# Patient Record
Sex: Female | Born: 1955 | Race: Black or African American | Hispanic: No | Smoking: Former smoker
Health system: Southern US, Community
[De-identification: ages and names within clinical notes are randomized; demographics above are authoritative.]

## PROBLEM LIST (undated history)

## (undated) DIAGNOSIS — IMO0001 Reserved for inherently not codable concepts without codable children: Secondary | ICD-10-CM

## (undated) DIAGNOSIS — J439 Emphysema, unspecified: Secondary | ICD-10-CM

## (undated) DIAGNOSIS — C801 Malignant (primary) neoplasm, unspecified: Secondary | ICD-10-CM

## (undated) DIAGNOSIS — I2510 Atherosclerotic heart disease of native coronary artery without angina pectoris: Secondary | ICD-10-CM

## (undated) DIAGNOSIS — M329 Systemic lupus erythematosus, unspecified: Secondary | ICD-10-CM

## (undated) DIAGNOSIS — K219 Gastro-esophageal reflux disease without esophagitis: Secondary | ICD-10-CM

## (undated) DIAGNOSIS — Z972 Presence of dental prosthetic device (complete) (partial): Secondary | ICD-10-CM

## (undated) DIAGNOSIS — M5386 Other specified dorsopathies, lumbar region: Secondary | ICD-10-CM

## (undated) DIAGNOSIS — J329 Chronic sinusitis, unspecified: Secondary | ICD-10-CM

## (undated) DIAGNOSIS — IMO0002 Reserved for concepts with insufficient information to code with codable children: Secondary | ICD-10-CM

## (undated) HISTORY — PX: CYST EXCISION: SHX5701

## (undated) HISTORY — DX: Chronic sinusitis, unspecified: J32.9

## (undated) HISTORY — PX: HAND SURGERY: SHX662

## (undated) HISTORY — PX: LUNG REMOVAL, PARTIAL: SHX233

## (undated) HISTORY — PX: TUBAL LIGATION: SHX77

## (undated) HISTORY — DX: Emphysema, unspecified: J43.9

## (undated) HISTORY — DX: Gastro-esophageal reflux disease without esophagitis: K21.9

---
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
## 2008-01-14 ENCOUNTER — Emergency Department: Payer: Self-pay | Admitting: Emergency Medicine

## 2008-01-14 ENCOUNTER — Other Ambulatory Visit: Payer: Self-pay

## 2009-09-16 ENCOUNTER — Ambulatory Visit: Payer: Self-pay | Admitting: Orthopedic Surgery

## 2009-09-23 ENCOUNTER — Ambulatory Visit: Payer: Self-pay | Admitting: Orthopedic Surgery

## 2010-06-30 ENCOUNTER — Ambulatory Visit: Payer: Self-pay | Admitting: Internal Medicine

## 2010-07-20 ENCOUNTER — Ambulatory Visit: Payer: Self-pay | Admitting: Internal Medicine

## 2010-07-31 ENCOUNTER — Ambulatory Visit: Payer: Self-pay | Admitting: Internal Medicine

## 2010-08-15 ENCOUNTER — Inpatient Hospital Stay: Payer: Self-pay | Admitting: Family Medicine

## 2010-08-31 ENCOUNTER — Ambulatory Visit: Payer: Self-pay | Admitting: Internal Medicine

## 2010-09-01 ENCOUNTER — Ambulatory Visit: Payer: Self-pay | Admitting: Internal Medicine

## 2015-09-09 ENCOUNTER — Encounter: Payer: Self-pay | Admitting: Emergency Medicine

## 2015-09-09 ENCOUNTER — Emergency Department
Admission: EM | Admit: 2015-09-09 | Discharge: 2015-09-09 | Disposition: A | Discharge status: Home / Self Care | Payer: BLUE CROSS/BLUE SHIELD | Attending: Emergency Medicine | Admitting: Emergency Medicine

## 2015-09-09 DIAGNOSIS — B349 Viral infection, unspecified: Secondary | ICD-10-CM | POA: Insufficient documentation

## 2015-09-09 DIAGNOSIS — F17200 Nicotine dependence, unspecified, uncomplicated: Secondary | ICD-10-CM | POA: Diagnosis not present

## 2015-09-09 DIAGNOSIS — R52 Pain, unspecified: Secondary | ICD-10-CM | POA: Diagnosis present

## 2015-09-09 HISTORY — DX: Systemic lupus erythematosus, unspecified: M32.9

## 2015-09-09 HISTORY — DX: Reserved for concepts with insufficient information to code with codable children: IMO0002

## 2015-09-09 LAB — CBC
HCT: 38 % (ref 35.0–47.0)
HEMOGLOBIN: 12.5 g/dL (ref 12.0–16.0)
MCH: 29.7 pg (ref 26.0–34.0)
MCHC: 32.9 g/dL (ref 32.0–36.0)
MCV: 90.3 fL (ref 80.0–100.0)
PLATELETS: 289 10*3/uL (ref 150–440)
RBC: 4.21 MIL/uL (ref 3.80–5.20)
RDW: 12.6 % (ref 11.5–14.5)
WBC: 4.5 10*3/uL (ref 3.6–11.0)

## 2015-09-09 LAB — BASIC METABOLIC PANEL
ANION GAP: 7 (ref 5–15)
BUN: 8 mg/dL (ref 6–20)
CALCIUM: 8.8 mg/dL — AB (ref 8.9–10.3)
CO2: 26 mmol/L (ref 22–32)
CREATININE: 0.81 mg/dL (ref 0.44–1.00)
Chloride: 106 mmol/L (ref 101–111)
Glucose, Bld: 82 mg/dL (ref 65–99)
Potassium: 3.7 mmol/L (ref 3.5–5.1)
Sodium: 139 mmol/L (ref 135–145)

## 2015-09-09 LAB — RAPID INFLUENZA A&B ANTIGENS (ARMC ONLY): INFLUENZA A (ARMC): NOT DETECTED

## 2015-09-09 LAB — TROPONIN I

## 2015-09-09 LAB — RAPID INFLUENZA A&B ANTIGENS: Influenza B (ARMC): NOT DETECTED

## 2015-09-09 MED ORDER — HYDROCOD POLST-CPM POLST ER 10-8 MG/5ML PO SUER: 5.0000 mL | Freq: Two times a day (BID) | ORAL | Status: DC

## 2015-09-09 MED ORDER — MORPHINE SULFATE (PF) 4 MG/ML IV SOLN
4.0000 mg | Freq: Once | INTRAVENOUS | Status: AC
Start: 2015-09-09 — End: 2015-09-09
  Administered 2015-09-09: 4 mg via INTRAVENOUS
  Filled 2015-09-09: qty 1

## 2015-09-09 MED ORDER — DIPHENHYDRAMINE HCL 50 MG/ML IJ SOLN
25.0000 mg | Freq: Once | INTRAMUSCULAR | Status: AC
  Administered 2015-09-09: 25 mg via INTRAVENOUS

## 2015-09-09 MED ORDER — ONDANSETRON HCL 4 MG/2ML IJ SOLN
4.0000 mg | Freq: Once | INTRAMUSCULAR | Status: AC
  Administered 2015-09-09: 4 mg via INTRAVENOUS
  Filled 2015-09-09: qty 2

## 2015-09-09 MED ORDER — SODIUM CHLORIDE 0.9 % IV SOLN
Freq: Once | INTRAVENOUS | Status: AC
  Administered 2015-09-09: 1000 mL via INTRAVENOUS

## 2015-09-09 MED ORDER — DIPHENHYDRAMINE HCL 50 MG/ML IJ SOLN
INTRAMUSCULAR | Status: AC
  Filled 2015-09-09: qty 1

## 2015-09-09 NOTE — ED Notes (Signed)
Pt reports "my balance ain't quite right; i'm just hurting".

## 2015-09-09 NOTE — ED Notes (Signed)
Pt placed on 2liters o2 - oxygen had dropped after being given medications.

## 2015-09-09 NOTE — ED Provider Notes (Signed)
[facility] Emergency Department Provider Note     Time seen: ----------------------------------------- 5:20 PM on 09/09/2015 -----------------------------------------    I have reviewed the triage vital signs and the nursing notes.   HISTORY  Chief Complaint Dizziness and Generalized Body Aches    HPI Sheila Suarez is a 60 y.o. female who presents ER stating her balance isn't quite right and she's hurting all over. Patient denies fever but has had chills, denies shortness of breath and has had some cough, denies persistent chest pain but does have occasional chest pain, has some sore throat but no congestion, has nausea but no vomiting and diarrhea.   Past Medical History  Diagnosis Date  . Lupus ([place])     There are no active problems to display for this patient.   History reviewed. No pertinent past surgical history.  Allergies Review of patient's allergies indicates no known allergies.  Social History Social History  Substance Use Topics  . Smoking status: Current Every Day Smoker  . Smokeless tobacco: None  . Alcohol Use: No    Review of Systems Constitutional: Negative for fever. Positive for chills and body aches Eyes: Negative for visual changes. ENT: Positive for sore throat Cardiovascular: Positive for chest pain Respiratory: Negative for shortness of breath. Positive for cough Gastrointestinal: Negative for abdominal pain, positive for nausea Genitourinary: Negative for dysuria. Musculoskeletal: Negative for back pain. Skin: Negative for rash. Neurological: Negative for headaches, focal weakness or numbness.  10-point ROS otherwise negative.  ____________________________________________   PHYSICAL EXAM:  VITAL SIGNS: ED Triage Vitals  Enc Vitals Group     BP --      Pulse --      Resp --      Temp --      Temp src --      SpO2 --      Weight --      Height --      Head Cir --      Peak Flow --      Pain  Score 09/09/15 1700 10     Pain Loc --      Pain Edu? --      Excl. in [place]? --     Constitutional: Alert and oriented.  Eyes: Conjunctivae are normal. PERRL. Normal extraocular movements. ENT   Head: Normocephalic and atraumatic.   Nose: No congestion/rhinnorhea.   Mouth/Throat: Mucous membranes are moist.   Neck: No stridor. Cardiovascular: Normal rate, regular rhythm. Normal and symmetric distal pulses are present in all extremities. No murmurs, rubs, or gallops. Respiratory: Normal respiratory effort without tachypnea nor retractions. Breath sounds are clear and equal bilaterally. No wheezes/rales/rhonchi. Gastrointestinal: Soft and nontender. No distention. No abdominal bruits.  Musculoskeletal: Nontender with normal range of motion in all extremities. No joint effusions.  No lower extremity tenderness nor edema. Neurologic:  Normal speech and language. No gross focal neurologic deficits are appreciated. Speech is normal. No gait instability. Skin:  Skin is warm, dry and intact. No rash noted. Psychiatric: Depressed mood and affect. ____________________________________________  EKG: Interpreted by me. Normal sinus rhythm with a rate of 76 bpm, normal PR interval, normal QRS, normal QT interval. Normal axis, normal EKG  ____________________________________________  ED COURSE:  Pertinent labs & imaging results that were available during my care of the patient were reviewed by me and considered in my medical decision making (see chart for details). Patient with histrionic behavior, is uncertain if this is a significant illness or not. ____________________________________________  LABS (pertinent positives/negatives)  Labs Reviewed  BASIC METABOLIC PANEL - Abnormal; Notable for the following:    Calcium 8.8 (*)    All other components within normal limits  RAPID INFLUENZA A&B ANTIGENS ([facility] ONLY)  CBC  TROPONIN I  URINALYSIS COMPLETEWITH MICROSCOPIC ([facility] ONLY)    ____________________________________________  FINAL ASSESSMENT AND PLAN  Viral syndrome  Plan: Patient with labs and as dictated above. Patient is currently improved, likely viral etiology for her symptoms. She'll be discharged with Tussionex and I will refer her to [facility] to establish care.   Earleen Newport, MD   Earleen Newport, MD 09/09/15 2008

## 2015-09-09 NOTE — Discharge Instructions (Signed)
Viral Infections A virus is a type of germ. Viruses can cause:  Minor sore throats.  Aches and pains.  Headaches.  Runny nose.  Rashes.  Watery eyes.  Tiredness.  Coughs.  Loss of appetite.  Feeling sick to your stomach (nausea).  Throwing up (vomiting).  Watery poop (diarrhea). HOME CARE   Only take medicines as told by your doctor.  Drink enough water and fluids to keep your pee (urine) clear or pale yellow. Sports drinks are a good choice.  Get plenty of rest and eat healthy. Soups and broths with crackers or rice are fine. GET HELP RIGHT AWAY IF:   You have a very bad headache.  You have shortness of breath.  You have chest pain or neck pain.  You have an unusual rash.  You cannot stop throwing up.  You have watery poop that does not stop.  You cannot keep fluids down.  You or your child has a temperature by mouth above 102 F (38.9 C), not controlled by medicine.  Your baby is older than 3 months with a rectal temperature of 102 F (38.9 C) or higher.  Your baby is 30 months old or younger with a rectal temperature of 100.4 F (38 C) or higher. MAKE SURE YOU:   Understand these instructions.  Will watch this condition.  Will get help right away if you are not doing well or get worse.   This information is not intended to replace advice given to you by your health care provider. Make sure you discuss any questions you have with your health care provider.   Document Released: 06/29/2008 Document Revised: 10/09/2011 Document Reviewed: 12/23/2014 Elsevier Interactive Patient Education [employer].

## 2016-02-07 ENCOUNTER — Encounter: Payer: Self-pay | Admitting: Emergency Medicine

## 2016-02-07 ENCOUNTER — Emergency Department: Payer: BLUE CROSS/BLUE SHIELD

## 2016-02-07 ENCOUNTER — Emergency Department
Admission: EM | Admit: 2016-02-07 | Discharge: 2016-02-07 | Disposition: A | Discharge status: Home / Self Care | Payer: BLUE CROSS/BLUE SHIELD | Attending: Emergency Medicine | Admitting: Emergency Medicine

## 2016-02-07 DIAGNOSIS — F17200 Nicotine dependence, unspecified, uncomplicated: Secondary | ICD-10-CM | POA: Insufficient documentation

## 2016-02-07 DIAGNOSIS — I2510 Atherosclerotic heart disease of native coronary artery without angina pectoris: Secondary | ICD-10-CM | POA: Insufficient documentation

## 2016-02-07 DIAGNOSIS — J449 Chronic obstructive pulmonary disease, unspecified: Secondary | ICD-10-CM | POA: Diagnosis not present

## 2016-02-07 DIAGNOSIS — K5790 Diverticulosis of intestine, part unspecified, without perforation or abscess without bleeding: Secondary | ICD-10-CM | POA: Insufficient documentation

## 2016-02-07 DIAGNOSIS — R918 Other nonspecific abnormal finding of lung field: Secondary | ICD-10-CM | POA: Diagnosis not present

## 2016-02-07 DIAGNOSIS — M545 Low back pain, unspecified: Secondary | ICD-10-CM

## 2016-02-07 DIAGNOSIS — K5730 Diverticulosis of large intestine without perforation or abscess without bleeding: Secondary | ICD-10-CM | POA: Insufficient documentation

## 2016-02-07 DIAGNOSIS — M79602 Pain in left arm: Secondary | ICD-10-CM | POA: Insufficient documentation

## 2016-02-07 DIAGNOSIS — R911 Solitary pulmonary nodule: Secondary | ICD-10-CM | POA: Insufficient documentation

## 2016-02-07 HISTORY — DX: Atherosclerotic heart disease of native coronary artery without angina pectoris: I25.10

## 2016-02-07 LAB — CBC
HEMATOCRIT: 40.3 % (ref 35.0–47.0)
Hemoglobin: 13.4 g/dL (ref 12.0–16.0)
MCH: 30.2 pg (ref 26.0–34.0)
MCHC: 33.2 g/dL (ref 32.0–36.0)
MCV: 90.9 fL (ref 80.0–100.0)
Platelets: 346 10*3/uL (ref 150–440)
RBC: 4.44 MIL/uL (ref 3.80–5.20)
RDW: 13.1 % (ref 11.5–14.5)
WBC: 8.4 10*3/uL (ref 3.6–11.0)

## 2016-02-07 LAB — TROPONIN I: Troponin I: <0.03 ng/mL (ref <0.03)

## 2016-02-07 LAB — BASIC METABOLIC PANEL
Anion gap: 5 (ref 5–15)
BUN: 15 mg/dL (ref 6–20)
CALCIUM: 9.8 mg/dL (ref 8.9–10.3)
CO2: 28 mmol/L (ref 22–32)
Chloride: 108 mmol/L (ref 101–111)
Creatinine, Ser: 0.79 mg/dL (ref 0.44–1.00)
GFR calc Af Amer: >60 mL/min (ref >60)
GLUCOSE: 104 mg/dL — AB (ref 65–99)
POTASSIUM: 4.4 mmol/L (ref 3.5–5.1)
Sodium: 141 mmol/L (ref 135–145)

## 2016-02-07 MED ORDER — MORPHINE SULFATE (PF) 4 MG/ML IV SOLN
4.0000 mg | Freq: Once | INTRAVENOUS | Status: AC
  Administered 2016-02-07: 4 mg via INTRAVENOUS

## 2016-02-07 MED ORDER — IOPAMIDOL (ISOVUE-370) INJECTION 76%
100.0000 mL | Freq: Once | INTRAVENOUS | Status: AC | PRN
  Administered 2016-02-07: 100 mL via INTRAVENOUS
  Filled 2016-02-07: qty 100

## 2016-02-07 MED ORDER — SODIUM CHLORIDE 0.9 % IV BOLUS (SEPSIS)
1000.0000 mL | Freq: Once | INTRAVENOUS | Status: AC
  Administered 2016-02-07: 1000 mL via INTRAVENOUS

## 2016-02-07 MED ORDER — MORPHINE SULFATE (PF) 4 MG/ML IV SOLN
INTRAVENOUS | Status: AC
  Administered 2016-02-07: 4 mg via INTRAVENOUS
  Filled 2016-02-07: qty 1

## 2016-02-07 MED ORDER — TRAMADOL HCL 50 MG PO TABS: 50.0000 mg | ORAL_TABLET | Freq: Four times a day (QID) | ORAL | Status: DC | PRN

## 2016-02-07 MED ORDER — ONDANSETRON HCL 4 MG/2ML IJ SOLN
4.0000 mg | Freq: Once | INTRAMUSCULAR | Status: AC
  Administered 2016-02-07: 4 mg via INTRAVENOUS

## 2016-02-07 MED ORDER — ONDANSETRON HCL 4 MG/2ML IJ SOLN
INTRAMUSCULAR | Status: AC
  Administered 2016-02-07: 4 mg via INTRAVENOUS
  Filled 2016-02-07: qty 2

## 2016-02-07 NOTE — ED Notes (Signed)
Pt presents with left arm pain, lower back pain, nausea, and sob since last night. Pt states she is pt of Dr. Nehemiah Massed, has heart blockage and is supposed to be following up with him q 3 months, but it has been over a year since she has seen him. Pt c/o just not feeling well. Pt alert & oriented with NAD noted.

## 2016-02-07 NOTE — ED Provider Notes (Signed)
[facility] Emergency Department Provider Note  Time seen: 1:47 PM  I have reviewed the triage vital signs and the nursing notes.   HISTORY  Chief Complaint Arm Pain; Nausea; Headache; and Back Pain    HPI [name] is a 60 y.o. female with a past medical history of lupus, CAD, who presents the emergency department with lower back pain and left arm pain. According to the patient her lower back began hurting last night. States this morning her left arm has been aching as well. Denies any chest pain. Does state mild nausea, with intermittent shortness breath over the past several weeks. Denies any history of back pain. Denies any strenuous activity. Denies abdominal pain. Patient states she does follow-up with Dr. Nehemiah Massed, had a cardiac catheterization performed several years ago but no stents placed.Currently describes the back pain as severe, aching pain in the lower back and dull moderate pain in the left arm.     Past Medical History  Diagnosis Date  . Lupus ([place])   . Coronary artery disease     There are no active problems to display for this patient.   History reviewed. No pertinent past surgical history.  Current Outpatient Rx  Name  Route  Sig  Dispense  Refill  . chlorpheniramine-HYDROcodone (TUSSIONEX PENNKINETIC ER) 10-8 MG/5ML SUER   Oral   Take 5 mLs by mouth 2 (two) times daily.   140 mL   0     Allergies Review of patient's allergies indicates no known allergies.  History reviewed. No pertinent family history.  Social History Social History  Substance Use Topics  . Smoking status: Current Every Day Smoker -- 0.50 packs/day  . Smokeless tobacco: None  . Alcohol Use: No     Comment: occasional    Review of Systems Constitutional: Negative for fever. Cardiovascular: Negative for chest pain. Respiratory: Negative for shortness of breath. Gastrointestinal: Negative for abdominal pain Genitourinary: Negative for  dysuria.  Musculoskeletal: Positive for back pain. Positive for left arm pain. Neurological: Negative for headache 10-point ROS otherwise negative.  ____________________________________________   PHYSICAL EXAM:  VITAL SIGNS: ED Triage Vitals  Enc Vitals Group     BP 02/07/16 1141 147/90 mmHg     Pulse Rate 02/07/16 1141 73     Resp 02/07/16 1141 18     Temp 02/07/16 1141 98.1 F (36.7 C)     Temp Source 02/07/16 1141 Oral     SpO2 02/07/16 1141 100 %     Weight 02/07/16 1141 108 lb (48.988 kg)     Height 02/07/16 1141 '5\' 5"'$  (1.651 m)     Head Cir --      Peak Flow --      Pain Score 02/07/16 1141 8     Pain Loc --      Pain Edu? --      Excl. in [place]? --     Constitutional: Alert and oriented. Well appearing and in no distress. Eyes: Normal exam ENT   Head: Normocephalic and atraumatic.   Mouth/Throat: Mucous membranes are moist. Cardiovascular: Normal rate, regular rhythm. No murmur Respiratory: Normal respiratory effort without tachypnea nor retractions. Breath sounds are clear  Gastrointestinal: Soft and nontender. No distention. Musculoskeletal: Moderate lumbar paraspinal tenderness to palpation. No left arm tenderness to palpation. Equal pulses in bilateral upper extremities. Sensation intact and equal in all extremities. Neurologic:  Normal speech and language. No gross focal neurologic deficits. Overall normal neurologic exam. Skin:  Skin is warm,  dry and intact.  Psychiatric: Mood and affect are normal.  ____________________________________________    EKG  EKG reviewed and interpreted by myself shows normal sinus rhythm at 71 bpm, narrow QRS, normal axis, normal intervals, nonspecific but no concerning ST changes. Overall reassuring EKG.  ____________________________________________    RADIOLOGY  CT shows left lung mass.  ____________________________________________    INITIAL IMPRESSION / ASSESSMENT AND PLAN / ED COURSE  Pertinent labs &  imaging results that were available during my care of the patient were reviewed by me and considered in my medical decision making (see chart for details).  The patient presents the emergency department lower back pain and left arm pain which began last night and worsened today. Patient doesn't moderate tenderness palpation of the lower back, no tenderness of the left arm. Equal pulses in bilateral upper extremities. We will check labs, proceed with a CT angiography to rule out dissection.  Labs are largely within normal limits including negative troponin. CTA pending.  CTA shows no aortic abnormality. There is a spiculated left upper lobe mass increased from prior exam. I discussed this with the patient and need to follow up with oncology. I have given the patient follow-up information. Patient states she is feeling much better after pain medication. Her second troponin is pending. If her second troponin is negative we will have the patient follow-up with oncology as well as cardiology this week. Patient agreeable to plan.  Repeat troponin negative. We'll discharge from a short course of pain medication.  ____________________________________________   FINAL CLINICAL IMPRESSION(S) / ED DIAGNOSES  Back pain Left lung mass  Harvest Dark, MD 02/07/16 1538

## 2016-02-07 NOTE — Discharge Instructions (Signed)
Your CT scan today shows a left lung mass. Please follow-up with oncology by calling the number provided to arrange further evaluation. Please take your pain medication as needed, as prescribed. Follow-up with your primary care doctor as well as Dr. Nehemiah Massed this week. Return to the emergency department for any worsening pain, or any other symptom personally concerning to your self.   Back Pain, Adult Back pain is very common in adults.The cause of back pain is rarely dangerous and the pain often gets better over time.The cause of your back pain may not be known. Some common causes of back pain include:  Strain of the muscles or ligaments supporting the spine.  Wear and tear (degeneration) of the spinal disks.  Arthritis.  Direct injury to the back. For many people, back pain may return. Since back pain is rarely dangerous, most people can learn to manage this condition on their own. HOME CARE INSTRUCTIONS Watch your back pain for any changes. The following actions may help to lessen any discomfort you are feeling:  Remain active. It is stressful on your back to sit or stand in one place for long periods of time. Do not sit, drive, or stand in one place for more than 30 minutes at a time. Take short walks on even surfaces as soon as you are able.Try to increase the length of time you walk each day.  Exercise regularly as directed by your health care provider. Exercise helps your back heal faster. It also helps avoid future injury by keeping your muscles strong and flexible.  Do not stay in bed.Resting more than 1-2 days can delay your recovery.  Pay attention to your body when you bend and lift. The most comfortable positions are those that put less stress on your recovering back. Always use proper lifting techniques, including:  Bending your knees.  Keeping the load close to your body.  Avoiding twisting.  Find a comfortable position to sleep. Use a firm mattress and lie on your  side with your knees slightly bent. If you lie on your back, put a pillow under your knees.  Avoid feeling anxious or stressed.Stress increases muscle tension and can worsen back pain.It is important to recognize when you are anxious or stressed and learn ways to manage it, such as with exercise.  Take medicines only as directed by your health care provider. Over-the-counter medicines to reduce pain and inflammation are often the most helpful.Your health care provider may prescribe muscle relaxant drugs.These medicines help dull your pain so you can more quickly return to your normal activities and healthy exercise.  Apply ice to the injured area:  Put ice in a plastic bag.  Place a towel between your skin and the bag.  Leave the ice on for 20 minutes, 2-3 times a day for the first 2-3 days. After that, ice and heat may be alternated to reduce pain and spasms.  Maintain a healthy weight. Excess weight puts extra stress on your back and makes it difficult to maintain good posture. SEEK MEDICAL CARE IF:  You have pain that is not relieved with rest or medicine.  You have increasing pain going down into the legs or buttocks.  You have pain that does not improve in one week.  You have night pain.  You lose weight.  You have a fever or chills. SEEK IMMEDIATE MEDICAL CARE IF:   You develop new bowel or bladder control problems.  You have unusual weakness or numbness in your arms or  legs.  You develop nausea or vomiting.  You develop abdominal pain.  You feel faint.   This information is not intended to replace advice given to you by your health care provider. Make sure you discuss any questions you have with your health care provider.   Document Released: 07/17/2005 Document Revised: 08/07/2014 Document Reviewed: 11/18/2013 Elsevier Interactive Patient Education [employer].

## 2016-02-07 NOTE — ED Notes (Signed)
Pt states back pain and left arm pain the began this AM, states nausea and a "bloody" feeling in her mouth, pt awake and alert in no acute distress

## 2016-02-07 NOTE — ED Notes (Signed)
No signature strip available in this room

## 2016-02-09 ENCOUNTER — Inpatient Hospital Stay: Payer: BLUE CROSS/BLUE SHIELD | Attending: Hematology and Oncology | Admitting: Hematology and Oncology

## 2016-02-09 ENCOUNTER — Encounter: Payer: Self-pay | Admitting: Hematology and Oncology

## 2016-02-09 ENCOUNTER — Inpatient Hospital Stay: Payer: BLUE CROSS/BLUE SHIELD

## 2016-02-09 VITALS — BP 145/86 | HR 50 | Temp 97.2°F | Resp 18 | Ht 66.14 in | Wt 112.7 lb

## 2016-02-09 DIAGNOSIS — R079 Chest pain, unspecified: Secondary | ICD-10-CM

## 2016-02-09 DIAGNOSIS — M329 Systemic lupus erythematosus, unspecified: Secondary | ICD-10-CM | POA: Insufficient documentation

## 2016-02-09 DIAGNOSIS — R918 Other nonspecific abnormal finding of lung field: Secondary | ICD-10-CM | POA: Insufficient documentation

## 2016-02-09 DIAGNOSIS — Z801 Family history of malignant neoplasm of trachea, bronchus and lung: Secondary | ICD-10-CM

## 2016-02-09 DIAGNOSIS — F17210 Nicotine dependence, cigarettes, uncomplicated: Secondary | ICD-10-CM | POA: Diagnosis not present

## 2016-02-09 DIAGNOSIS — R911 Solitary pulmonary nodule: Secondary | ICD-10-CM | POA: Diagnosis not present

## 2016-02-09 DIAGNOSIS — R0602 Shortness of breath: Secondary | ICD-10-CM | POA: Diagnosis not present

## 2016-02-09 DIAGNOSIS — Z800 Family history of malignant neoplasm of digestive organs: Secondary | ICD-10-CM | POA: Diagnosis not present

## 2016-02-09 DIAGNOSIS — R634 Abnormal weight loss: Secondary | ICD-10-CM | POA: Diagnosis not present

## 2016-02-09 DIAGNOSIS — I2510 Atherosclerotic heart disease of native coronary artery without angina pectoris: Secondary | ICD-10-CM | POA: Insufficient documentation

## 2016-02-09 DIAGNOSIS — R109 Unspecified abdominal pain: Secondary | ICD-10-CM | POA: Diagnosis not present

## 2016-02-09 DIAGNOSIS — Z79899 Other long term (current) drug therapy: Secondary | ICD-10-CM | POA: Diagnosis not present

## 2016-02-09 LAB — HEPATIC FUNCTION PANEL
ALT: 19 U/L (ref 14–54)
AST: 20 U/L (ref 15–41)
Albumin: 3.8 g/dL (ref 3.5–5.0)
Alkaline Phosphatase: 64 U/L (ref 38–126)
Bilirubin, Direct: <0.1 mg/dL — ABNORMAL LOW (ref 0.1–0.5)
Total Bilirubin: 0.6 mg/dL (ref 0.3–1.2)
Total Protein: 6.5 g/dL (ref 6.5–8.1)

## 2016-02-09 LAB — TSH: TSH: 0.635 u[IU]/mL (ref 0.350–4.500)

## 2016-02-09 LAB — T4, FREE: Free T4: 0.81 ng/dL (ref 0.61–1.12)

## 2016-02-09 NOTE — Progress Notes (Signed)
Sheila Suarez is a 60 y.o. female with lupus and a left upper lobe nodule who is referred by Dr. Harvest Dark for assessment and management.  HPI: The patient notes a history of lupus for the past 8 years. She states that she was treated with "something" briefly as it affected her vision.  She is not followed regularly by rheumatology.  She has a half pack per day smoking history for greater than 30 years.  She wuit briefly when she was pregnant. She denies any history of emphysema or COPD.  She presented to the [facility] ER on 02/07/2016 with left arm pain and shortness of breath. As part of her evaluation, she underwent CT angiogram of the chest, abdominal and pelvis. There was no evidence of aortic pathology. There was a 1.1 cm spiculated lesion in the left upper lobe increased from prior exam (08/30/2010). There were stable small pulmonary nodules bilaterally (2-3 mm).  Labs on 02/07/2016 included a normal CBC and BMP.  Creatinine was 0.79.  Calcium was 9.8.  The patient notes a weight loss of approximate 16 pounds (unclear period of time).   She states that she eats constantly and should not be losing weight.  She notes a shortness of breath and a little chest pain with exertion. She notes stomach cramping. She has never seen GI.    She has a family history of sister of pancreatic and lung cancer. A brother has "cancer". Paternal uncle, aunts, and cousins have brain, lung, and prostate cancer. Paternal grandmother developed a gastric cancer in her 68s.   Past Medical History  Diagnosis Date  . Lupus ([place])   . Coronary artery disease     Past Surgical History  Procedure Laterality Date  . Tubal ligation Bilateral     37 years ago    No family history on file.  Social History:  reports that she has been smoking.  She does not have any smokeless tobacco history on file. She  reports that she does not drink alcohol. Her drug history is not on file.  She works in [place] in [employer], trade [employer].  The patient is alone today.  Allergies: No Known Allergies  Current Medications: Current Outpatient Prescriptions  Medication Sig Dispense Refill  . traMADol (ULTRAM) 50 MG tablet Take 1 tablet (50 mg total) by mouth every 6 (six) hours as needed for moderate pain. 20 tablet 0   No current facility-administered medications for this visit.    Review of Systems:  GENERAL:  Feels "ok".  No fevers.  Sweats at night.  Weight loss of 16 pounds over an unclear period of time. PERFORMANCE STATUS (ECOG):  1 HEENT:  Poor vision (close and far away).  Needs glasses.  No runny nose, sore throat, mouth sores or tenderness. Lungs: Shortness of breath.  No cough.  Little phlegm.  No hemoptysis. Cardiac:  No chest pain, palpitations, orthopnea, or PND. GI:  Stomach cramping.  No nausea, vomiting, diarrhea, constipation, melena or hematochezia. GU:  No urgency, frequency, dysuria, or hematuria. Musculoskeletal:  No back pain.  No joint pain.  No muscle tenderness. Extremities:  No pain or swelling. Skin:  No rashes or skin changes. Neuro:  No headache, numbness or weakness, balance or coordination issues. Endocrine:  No diabetes, thyroid issues, hot flashes or night sweats. Psych:  No mood changes, depression or anxiety. Pain:  No focal pain. Review of systems:  All other systems reviewed and found to be negative.  Physical Exam: Blood pressure 145/86, pulse 50, temperature 97.2 F (36.2 C), temperature source Tympanic, resp. rate 18, height 5' 6.14" (1.68 m), weight 112 lb 10.5 oz (51.1 kg). GENERAL:  Thin woman sitting comfortably in the exam room in no acute distress. MENTAL STATUS:  Alert and oriented to person, place and time. HEAD:  Wearing a light green hat. Brown hair.  Normocephalic, atraumatic, face symmetric, no Cushingoid features. EYES:  Glasses.  Brown eyes.  Pupils  equal round and reactive to light and accomodation.  No conjunctivitis or scleral icterus. ENT:  Oropharynx clear without lesion.  Dentures.  Tongue normal. Mucous membranes moist.  RESPIRATORY:  Clear to auscultation without rales, wheezes or rhonchi. CARDIOVASCULAR:  Regular rate and rhythm without murmur, rub or gallop. ABDOMEN:  Soft, non-tender, with active bowel sounds, and no hepatosplenomegaly.  No masses. SKIN:  No rashes, ulcers or lesions. EXTREMITIES: Slight edema left lower extremity.  No no skin discoloration or tenderness.  No palpable cords. LYMPH NODES: No palpable cervical, supraclavicular, axillary or inguinal adenopathy  NEUROLOGICAL: Unremarkable. PSYCH:  Appropriate.  Admission on 02/07/2016, Discharged on 02/07/2016  Component Date Value Ref Range Status  . Sodium 02/07/2016 141  135 - 145 mmol/L Final  . Potassium 02/07/2016 4.4  3.5 - 5.1 mmol/L Final  . Chloride 02/07/2016 108  101 - 111 mmol/L Final  . CO2 02/07/2016 28  22 - 32 mmol/L Final  . Glucose, Bld 02/07/2016 104* 65 - 99 mg/dL Final  . BUN 02/07/2016 15  6 - 20 mg/dL Final  . Creatinine, Ser 02/07/2016 0.79  0.44 - 1.00 mg/dL Final  . Calcium 02/07/2016 9.8  8.9 - 10.3 mg/dL Final  . GFR calc non Af Amer 02/07/2016 >60  >60 mL/min Final  . GFR calc Af Amer 02/07/2016 >60  >60 mL/min Final   Comment: (NOTE) The eGFR has been calculated using the CKD EPI equation. This calculation has not been validated in all clinical situations. eGFR's persistently <60 mL/min signify possible Chronic Kidney Disease.   . Anion gap 02/07/2016 5  5 - 15 Final  . WBC 02/07/2016 8.4  3.6 - 11.0 K/uL Final  . RBC 02/07/2016 4.44  3.80 - 5.20 MIL/uL Final  . Hemoglobin 02/07/2016 13.4  12.0 - 16.0 g/dL Final  . HCT 02/07/2016 40.3  35.0 - 47.0 % Final  . MCV 02/07/2016 90.9  80.0 - 100.0 fL Final  . MCH 02/07/2016 30.2  26.0 - 34.0 pg Final  . MCHC 02/07/2016 33.2  32.0 - 36.0 g/dL Final  . RDW 02/07/2016 13.1  11.5  - 14.5 % Final  . Platelets 02/07/2016 346  150 - 440 K/uL Final  . Troponin I 02/07/2016 <0.03  <0.03 ng/mL Final  . Troponin I 02/07/2016 <0.03  <0.03 ng/mL Final    Assessment:  Sheila Suarez is a 60 y.o. female with lupus x 8 years.  CT angiogram of the chest, abdominal and pelvis on 02/17/2016 revealed a 1.1 cm spiculated lesion in the left upper lobe increased from prior exam (08/30/2010). There were stable small pulmonary nodules bilaterally (2-3 mm).  She has a 15 pack year smoking history.  Symptomatically, she has lost 16 pounds over an unclear amount of time.  She has never had a colonoscopy.  Exam reveals no adenopathy or hepatosplenomegaly.  Plan: 1.  Review recent imaging.  Discuss unclear LUL lesion.  Discuss PET scan.  Discuss baseline labs.  Discuss presentation  a tumor board. 2.  Labs today:  LFTs, TSH, free T4. 3.  Schedule PET scan. 4.  RTC after PET scan for MD review and discussion regarding direction of therapy.   Lequita Asal, MD  02/09/2016, 2:09 PM

## 2016-02-09 NOTE — Progress Notes (Signed)
Patient is here for a new evaluation, she is asking about what the plan of action is, some chest discomfort

## 2016-02-13 ENCOUNTER — Encounter: Payer: Self-pay | Admitting: Hematology and Oncology

## 2016-02-14 ENCOUNTER — Telehealth: Payer: Self-pay

## 2016-02-14 ENCOUNTER — Other Ambulatory Visit: Payer: Self-pay | Admitting: Hematology and Oncology

## 2016-02-14 ENCOUNTER — Telehealth: Payer: Self-pay | Admitting: *Deleted

## 2016-02-14 DIAGNOSIS — M329 Systemic lupus erythematosus, unspecified: Secondary | ICD-10-CM

## 2016-02-14 NOTE — Telephone Encounter (Signed)
  Can we set up a referral to Dr. Jefm Bryant?  M

## 2016-02-14 NOTE — Telephone Encounter (Signed)
Can we please send a referral to Dr. Jefm Bryant, the order is in the computer. Thanks!

## 2016-02-14 NOTE — Telephone Encounter (Signed)
Spoke with Dr. Mike Gip, she was agreeable with sending patient to see Dr. Jefm Bryant. Referral has been placed and send over to colette to finish the referral process.

## 2016-02-14 NOTE — Telephone Encounter (Signed)
Dr Mike Gip told her she needs to see her Lupus doctor and they told her she needs a referral before they will see her because she never went to her first appt with them, Dr Duard Brady

## 2016-02-15 ENCOUNTER — Encounter
Admission: RE | Admit: 2016-02-15 | Discharge: 2016-02-15 | Disposition: A | Discharge status: Home / Self Care | Payer: BLUE CROSS/BLUE SHIELD | Source: Ambulatory Visit | Attending: Hematology and Oncology | Admitting: Hematology and Oncology

## 2016-02-15 DIAGNOSIS — R911 Solitary pulmonary nodule: Secondary | ICD-10-CM | POA: Diagnosis present

## 2016-02-15 DIAGNOSIS — R634 Abnormal weight loss: Secondary | ICD-10-CM | POA: Insufficient documentation

## 2016-02-15 LAB — GLUCOSE, CAPILLARY: Glucose-Capillary: 63 mg/dL — ABNORMAL LOW (ref 65–99)

## 2016-02-15 MED ORDER — FLUDEOXYGLUCOSE F - 18 (FDG) INJECTION
12.7600 | Freq: Once | INTRAVENOUS | Status: AC | PRN
  Administered 2016-02-15: 12.76 via INTRAVENOUS

## 2016-02-16 NOTE — Telephone Encounter (Signed)
Referral entered and med. rec sent notice and she has an appt that md office called lisa yest and they had called pt to tell her the appt and it is next week

## 2016-02-18 ENCOUNTER — Inpatient Hospital Stay (HOSPITAL_BASED_OUTPATIENT_CLINIC_OR_DEPARTMENT_OTHER): Payer: BLUE CROSS/BLUE SHIELD | Admitting: Hematology and Oncology

## 2016-02-18 ENCOUNTER — Encounter: Payer: Self-pay | Admitting: Hematology and Oncology

## 2016-02-18 VITALS — BP 124/79 | HR 57 | Temp 97.7°F | Resp 18 | Wt 114.6 lb

## 2016-02-18 DIAGNOSIS — M329 Systemic lupus erythematosus, unspecified: Secondary | ICD-10-CM

## 2016-02-18 DIAGNOSIS — Z801 Family history of malignant neoplasm of trachea, bronchus and lung: Secondary | ICD-10-CM

## 2016-02-18 DIAGNOSIS — Z800 Family history of malignant neoplasm of digestive organs: Secondary | ICD-10-CM

## 2016-02-18 DIAGNOSIS — F17210 Nicotine dependence, cigarettes, uncomplicated: Secondary | ICD-10-CM

## 2016-02-18 DIAGNOSIS — R918 Other nonspecific abnormal finding of lung field: Secondary | ICD-10-CM

## 2016-02-18 DIAGNOSIS — R911 Solitary pulmonary nodule: Secondary | ICD-10-CM | POA: Diagnosis not present

## 2016-02-18 DIAGNOSIS — R634 Abnormal weight loss: Secondary | ICD-10-CM | POA: Diagnosis not present

## 2016-02-18 DIAGNOSIS — Z79899 Other long term (current) drug therapy: Secondary | ICD-10-CM

## 2016-02-18 DIAGNOSIS — I2510 Atherosclerotic heart disease of native coronary artery without angina pectoris: Secondary | ICD-10-CM

## 2016-02-18 DIAGNOSIS — R0602 Shortness of breath: Secondary | ICD-10-CM

## 2016-02-18 NOTE — Progress Notes (Signed)
Patient is here for pet scan results.

## 2016-02-18 NOTE — Progress Notes (Signed)
[facility] day:  02/18/2016  Chief Complaint: Sheila Suarez is a 60 y.o. female with lupus and a left upper lobe nodule who is seen for review of interval PET scan and discussion regarding direction of therapy.  HPI: The patient was last seen in the medical oncology clinic on 02/09/2016.  At that time, CT angiogram of the chest, abdominal and pelvis was reviewed.  Images on 02/17/2016 revealed a 1.1 cm spiculated lesion in the left upper lobe increased from prior exam (08/30/2010). There were stable small pulmonary nodules bilaterally (2-3 mm).  She had a 15 pack year smoking history.  She had lost weight (16 pounds over an unspecified period).    TSH was 0.635 (normal).  Free T4 was 0.81 (normal).  PET scan on 02/15/2016 revealed an irregular 1.0 cm apical LUL pulmonary nodule (SUV 0.7).  There was a 4 mm right middle lobe solid pulmonary nodule (stable since 08/02/2010) and two additional right lower lobe 3 mm pulmonary nodules. Follow-up chest CT in 12 months was recommended.  Symptomatically, she denies any new complaints.  Weight is up 2 pounds (she thinks from heavier shoes).   Past Medical History  Diagnosis Date  . Lupus ([place])   . Coronary artery disease     Past Surgical History  Procedure Laterality Date  . Tubal ligation Bilateral     37 years ago    Family History  Problem Relation Age of Onset  . Pancreatic cancer Sister   . Lung cancer Sister   . Lung cancer Brother   . Stomach cancer Paternal Grandmother     Social History:  reports that she has been smoking.  She does not have any smokeless tobacco history on file. She reports that she does not drink alcohol. Her drug history is not on file.  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  The patient is alone today.  Allergies: No Known Allergies  Current Medications: Current Outpatient Prescriptions  Medication Sig Dispense Refill  . traMADol (ULTRAM) 50 MG  tablet Take 1 tablet (50 mg total) by mouth every 6 (six) hours as needed for moderate pain. 20 tablet 0   No current facility-administered medications for this visit.    Review of Systems:  GENERAL:  Feels "ok".  No fevers.  Sweats at night.  Weight loss of 16 pounds over an unclear period of time. PERFORMANCE STATUS (ECOG):  1 HEENT:  Poor vision (near and far).  Needs glasses.  No runny nose, sore throat, mouth sores or tenderness. Lungs: Shortness of breath, stable.  No cough.  Little phlegm.  No hemoptysis. Cardiac:  No chest pain, palpitations, orthopnea, or PND. GI:  No nausea, vomiting, diarrhea, constipation, melena or hematochezia. GU:  No urgency, frequency, dysuria, or hematuria. Musculoskeletal:  No back pain.  No joint pain.  No muscle tenderness. Extremities:  No pain or swelling. Skin:  No rashes or skin changes. Neuro:  No headache, numbness or weakness, balance or coordination issues. Endocrine:  No diabetes, thyroid issues, hot flashes or night sweats. Psych:  No mood changes, depression or anxiety. Pain:  No focal pain. Review of systems:  All other systems reviewed and found to be negative.  Physical Exam: Blood pressure 124/79, pulse 57, temperature 97.7 F (36.5 C), temperature source Tympanic, resp. rate 18, weight 114 lb 10.2 oz (52 kg). GENERAL:  Thin woman sitting comfortably in the exam room in no acute distress. MENTAL STATUS:  Alert and  oriented to person, place and time. HEAD:  Brown hair.  Normocephalic, atraumatic, face symmetric, no Cushingoid features. EYES:  Glasses.  Brown eyes.  No conjunctivitis or scleral icterus. NEUROLOGICAL: Unremarkable. PSYCH:  Appropriate.   No visits with results within 3 Day(s) from this visit. Latest known visit with results is:  Hospital Outpatient Visit on 02/15/2016  Component Date Value Ref Range Status  . Glucose-Capillary 02/15/2016 63* 65 - 99 mg/dL Final    Assessment:  Sheila Suarez is a 60 y.o.  female with lupus x 8 years.  CT angiogram of the chest, abdominal and pelvis on 02/17/2016 revealed a 1.1 cm spiculated lesion in the left upper lobe increased from prior exam (08/30/2010). There were stable small pulmonary nodules bilaterally (2-3 mm).  She has a 15 pack year smoking history.  PET scan on 02/15/2016 revealed an irregular 1.0 cm apical LUL pulmonary nodule (SUV 0.7).  There was a 4 mm right middle lobe solid pulmonary nodule (stable since 08/02/2010) and two additional right lower lobe 3 mm pulmonary nodules.   Symptomatically, she has lost 16 pounds over an unclear amount of time.  Thyroid function studies were normal.  She has never had a colonoscopy.  Exam reveals no adenopathy or hepatosplenomegaly.  Plan: 1.  Review PET scan.  Discuss LUL pulmonary nodule, possibly representing a slow growing tumor (low SUV).  Discuss radiology recommendations of follow-up in 12 months.  Discuss referral to cardiothoracic surgery and presentation at tumor board.  If follow-up recommended, recommend 6 month interval.  Discuss checking PFTs (surgery contemplated).   2.  Discuss with Dr. Nadeen Landau. 3.  Referral to cardiothoracic surgery (Dr Genevive Bi). 4.  Schedule PFTs. 5.  Present at tumor board on 02/24/2016. 6.  RTC in 2 weeks for MD review and discussion regarding plan for LUL lesion.   Lequita Asal, MD  02/18/2016, 11:52 AM

## 2016-02-21 DIAGNOSIS — C3490 Malignant neoplasm of unspecified part of unspecified bronchus or lung: Secondary | ICD-10-CM | POA: Insufficient documentation

## 2016-02-21 DIAGNOSIS — R760 Raised antibody titer: Secondary | ICD-10-CM | POA: Insufficient documentation

## 2016-02-22 ENCOUNTER — Ambulatory Visit (INDEPENDENT_AMBULATORY_CARE_PROVIDER_SITE_OTHER): Payer: BLUE CROSS/BLUE SHIELD | Admitting: Cardiothoracic Surgery

## 2016-02-22 ENCOUNTER — Encounter: Payer: Self-pay | Admitting: Cardiothoracic Surgery

## 2016-02-22 VITALS — BP 145/88 | HR 83 | Temp 98.3°F | Ht 65.0 in | Wt 112.0 lb

## 2016-02-22 DIAGNOSIS — R918 Other nonspecific abnormal finding of lung field: Secondary | ICD-10-CM | POA: Diagnosis not present

## 2016-02-22 NOTE — Progress Notes (Signed)
Patient ID: Sheila Suarez, female   DOB: 07/12/1956, 60 y.o.   MRN: [id number]  Chief Complaint  Patient presents with  . Other    Left Upper Lobe Lesion    Referred By Dr. Nolon Stalls Reason for Referral left upper lobe mass  HPI Location, Quality, Duration, Severity, Timing, Context, Modifying Factors, Associated Signs and Symptoms.  Bryanne Riquelme is a 60 y.o. female.  She was in her usual state of health until several weeks ago when she experienced severe back pain and left arm pain. At the time she was felt to have a myocardial infarction and was brought to the emergency department. While in the emergency department a chest x-ray and CT scan were performed. The CT scan revealed a left upper lobe nodule. The nodule in the left upper lobe was compared to a prior CT scan from 2012. The nodule had increased in size. The patient states that she does get short of breath with minimal activities. Walking up and down a flight of stairs causes significant limitations. She continues to have the same back pain that brought her into the emergency department and she states that the tramadol which has been prescribed for her does not work. She states that her arm pain is now better. She is a lifelong smoker. She smokes one pack cigarettes a day. She is currently trying to reduce that and is down to just a few cigarettes per day. She has not had any hemoptysis fevers or chills. She does carry a diagnosis of lupus. She has multiple family members with malignancies including some with lung cancer.   Past Medical History:  Diagnosis Date  . Coronary artery disease   . Lupus [facility])     Past Surgical History:  Procedure Laterality Date  . CYST EXCISION Left   . TUBAL LIGATION Bilateral    37 years ago    Family History  Problem Relation Age of Onset  . Pancreatic cancer Sister   . Lung cancer Sister   . Lung cancer Brother   . Stomach cancer Paternal Grandmother   . Heart disease Father    . Cancer Daughter 86    cervical  . Cancer Maternal Aunt     brain  . Cancer Maternal Uncle     prostate  . Cancer Maternal Aunt     bone    Social History Social History  Substance Use Topics  . Smoking status: Current Every Day Smoker    Packs/day: 0.50    Years: 35.00  . Smokeless tobacco: Not on file  . Alcohol use 0.6 oz/week    1 Cans of beer per week     Comment: occasional    No Known Allergies  Current Outpatient Prescriptions  Medication Sig Dispense Refill  . traMADol (ULTRAM) 50 MG tablet Take 1 tablet (50 mg total) by mouth every 6 (six) hours as needed for moderate pain. 20 tablet 0   No current facility-administered medications for this visit.       Review of Systems A complete review of systems was asked and was negative except for the following positive findingsRecent weight loss, swelling of lower extremities, joint pain, easy bruising.  She states that she did have a cardiac catheterization about 8 years ago by Dr. Jose Persia and is scheduled to follow-up with him in a couple weeks.  Blood pressure (!) 145/88, pulse 83, temperature 98.3 F (36.8 C), temperature source Oral, height '5\' 5"'$  (1.651 m), weight 112 lb (50.8 kg), SpO2 100 %.  Physical Exam CONSTITUTIONAL:  Pleasant, well-developed, well-nourished, and in no acute distress. EYES: Pupils equal and reactive to light, Sclera non-icteric EARS, NOSE, MOUTH AND THROAT:  The oropharynx was clear.  Dentition is absent.  Oral mucosa pink and moist. LYMPH NODES:  Lymph nodes in the neck and axillae were normal RESPIRATORY:  Lungs were clear.  Normal respiratory effort without pathologic use of accessory muscles of respiration CARDIOVASCULAR: Heart was regular without murmurs.  There were no carotid bruits. GI: The abdomen was soft, nontender, and nondistended. There were no palpable masses. There was no hepatosplenomegaly. There were normal bowel sounds in all quadrants. GU:  Rectal deferred.    MUSCULOSKELETAL:  Normal muscle strength and tone.  No clubbing or cyanosis.   SKIN:  There were no pathologic skin lesions.  There were no nodules on palpation. NEUROLOGIC:  Sensation is normal.  Cranial nerves are grossly intact. PSYCH:  Oriented to person, place and time.  Mood and affect are normal.  Data Reviewed CT scans and PET scans  I have personally reviewed the patient's imaging, laboratory findings and medical records.    Assessment    I have independently reviewed the patient's chest CT and PET scan and compared it to her prior scans. The left upper lobe nodule is certainly concerning for a malignancy. It is a very slow-growing tumor as there has been only some change over the last 5 years.    Plan    I explained to the patient the indications and risks of the various diagnostic and treatment options. We reviewed the options of surgery versus radiation therapy for stage I carcinoma the lung. We also discussed the pros and cons of the various treatment plans. He also discussed the possibility of performing a bronchoscopy or percutaneous biopsy. The patient had all of her questions answered. She will obtain her pulmonary function tests next week and I will follow-up with her after that.       [name], MD 02/22/2016, 10:24 AM

## 2016-02-22 NOTE — Patient Instructions (Addendum)
Your Pulmonary Function Test is scheduled for 02/29/2016 at the [facility] at 9:30 AM but please arrive at 9:15 AM.  After you have your Pulmonary Function Test, I will need to see you back 03/03/2016 so we could talk about your options.

## 2016-02-23 ENCOUNTER — Emergency Department
Admission: EM | Admit: 2016-02-23 | Discharge: 2016-02-23 | Disposition: A | Discharge status: Home / Self Care | Payer: BLUE CROSS/BLUE SHIELD | Attending: Emergency Medicine | Admitting: Emergency Medicine

## 2016-02-23 ENCOUNTER — Emergency Department: Payer: BLUE CROSS/BLUE SHIELD

## 2016-02-23 ENCOUNTER — Encounter: Payer: Self-pay | Admitting: Emergency Medicine

## 2016-02-23 DIAGNOSIS — I2510 Atherosclerotic heart disease of native coronary artery without angina pectoris: Secondary | ICD-10-CM | POA: Diagnosis not present

## 2016-02-23 DIAGNOSIS — Y999 Unspecified external cause status: Secondary | ICD-10-CM | POA: Insufficient documentation

## 2016-02-23 DIAGNOSIS — Y92007 Garden or yard of unspecified non-institutional (private) residence as the place of occurrence of the external cause: Secondary | ICD-10-CM | POA: Insufficient documentation

## 2016-02-23 DIAGNOSIS — W540XXA Bitten by dog, initial encounter: Secondary | ICD-10-CM | POA: Insufficient documentation

## 2016-02-23 DIAGNOSIS — S71152A Open bite, left thigh, initial encounter: Secondary | ICD-10-CM | POA: Diagnosis not present

## 2016-02-23 DIAGNOSIS — Y939 Activity, unspecified: Secondary | ICD-10-CM | POA: Diagnosis not present

## 2016-02-23 DIAGNOSIS — F17210 Nicotine dependence, cigarettes, uncomplicated: Secondary | ICD-10-CM | POA: Diagnosis not present

## 2016-02-23 MED ORDER — AMOXICILLIN-POT CLAVULANATE 875-125 MG PO TABS: 1.0000 | ORAL_TABLET | Freq: Two times a day (BID) | ORAL | 0 refills | Status: AC

## 2016-02-23 MED ORDER — HYDROCODONE-ACETAMINOPHEN 5-325 MG PO TABS: 1.0000 | ORAL_TABLET | ORAL | 0 refills | Status: DC | PRN

## 2016-02-23 NOTE — Discharge Instructions (Signed)
Begin taking Augmentin 875 twice a day for 7 days along with Norco as needed for pain. Clean wound twice a day with mild soap and water. Return to the emergency room for rabies vaccine if the control. We'll contact to saying that is necessary. Also contact your surgeon to let them know your situation with the dog bite. Also return to the emergency room if he cannot see her primary care doctor if any signs of infection.

## 2016-02-23 NOTE — ED Triage Notes (Signed)
States a pitt bull bit her let leg. States it was walking around in her neighborhood, never seen it before. Reported to [employer] to contact animal control.

## 2016-02-23 NOTE — ED Notes (Signed)
Soak of 1/2 NS and 1/2 betadine on gauze to wound.

## 2016-02-23 NOTE — ED Provider Notes (Signed)
[facility] Emergency Department Provider Note   ____________________________________________  Time seen: Approximately 1:21 PM  I have reviewed the triage vital signs and the nursing notes.   HISTORY  Chief Complaint Puncture Wound    HPI Sheila Suarez is a 60 y.o. female is here with complaint of a dog bite to her left leg. Patient states that she was in her garden when a older-looking dog that she believes to be a pit bull  came around from her brother's truck and that her. According to the patient this was unprovoked. Patient is continued to be ambulatory since this incident.Patient has already spoken with animal control and the officer is at her house currently looking for the dog. Patient rates her pain as a 9 out of 10.   Past Medical History:  Diagnosis Date  . Coronary artery disease   . Lupus [facility])     Patient Active Problem List   Diagnosis Date Noted  . Weight loss 02/09/2016  . Nodule of left lung 02/07/2016    Past Surgical History:  Procedure Laterality Date  . CYST EXCISION Left   . TUBAL LIGATION Bilateral    37 years ago    Prior to Admission medications   Medication Sig Start Date End Date Taking? Authorizing Provider  amoxicillin-clavulanate (AUGMENTIN) 875-125 MG tablet Take 1 tablet by mouth 2 (two) times daily. 02/23/16 03/01/16  Johnn Hai, PA-C  HYDROcodone-acetaminophen (NORCO/VICODIN) 5-325 MG tablet Take 1 tablet by mouth every 4 (four) hours as needed for moderate pain. 02/23/16   Johnn Hai, PA-C  traMADol (ULTRAM) 50 MG tablet Take 1 tablet (50 mg total) by mouth every 6 (six) hours as needed for moderate pain. 02/07/16   Harvest Dark, MD    Allergies Review of patient's allergies indicates no known allergies.  Family History  Problem Relation Age of Onset  . Pancreatic cancer Sister   . Lung cancer Sister   . Lung cancer Brother   . Stomach cancer Paternal Grandmother   . Heart disease  Father   . Cancer Daughter 77    cervical  . Cancer Maternal Aunt     brain  . Cancer Maternal Uncle     prostate  . Cancer Maternal Aunt     bone    Social History Social History  Substance Use Topics  . Smoking status: Current Every Day Smoker    Packs/day: 0.50    Years: 35.00  . Smokeless tobacco: Never Used  . Alcohol use 0.6 oz/week    1 Cans of beer per week     Comment: occasional    Review of Systems Constitutional: No fever/chills Eyes: No visual changes. ENT: No trauma Cardiovascular: Denies chest pain. Respiratory: States she currently has lung cancer. She denies any difficulty breathing at this time. Gastrointestinal:   No nausea, no vomiting.  Musculoskeletal: Negative for back pain. Skin: Positive for puncture wound. Neurological: Negative for  focal weakness or numbness.  10-point ROS otherwise negative.  ____________________________________________   PHYSICAL EXAM:  VITAL SIGNS: ED Triage Vitals  Enc Vitals Group     BP 02/23/16 1250 (!) 154/78     Pulse Rate 02/23/16 1250 66     Resp 02/23/16 1250 16     Temp 02/23/16 1250 98.1 F (36.7 C)     Temp Source 02/23/16 1250 Oral     SpO2 02/23/16 1250 100 %     Weight 02/23/16 1250 120 lb (54.4 kg)  Height 02/23/16 1250 '5\' 6"'$  (1.676 m)     Head Circumference --      Peak Flow --      Pain Score 02/23/16 1249 9     Pain Loc --      Pain Edu? --      Excl. in [place]? --     Constitutional: Alert and oriented. Well appearing and in no acute distress. Eyes: Conjunctivae are normal. PERRL. EOMI. Head: Atraumatic. Nose: No congestion/rhinnorhea. Neck: No stridor.   Cardiovascular: Normal rate, regular rhythm. Grossly normal heart sounds.  Good peripheral circulation. Respiratory: Normal respiratory effort.  No retractions. Lungs CTAB. Gastrointestinal: Soft and nontender. No distention.  Musculoskeletal: Moves upper and lower extremities without any difficulty. Left leg is slightly slower the  patient has full range of motion and is ambulatory. Neurologic:  Normal speech and language. No gross focal neurologic deficits are appreciated. No gait instability. Skin:  Skin is warm, dry. There are 2 superficial puncture wounds to the anterior aspect of the mid thigh. No active bleeding is noted at this time. Psychiatric: Mood and affect are normal. Speech and behavior are normal.  ____________________________________________   LABS (all labs ordered are listed, but only abnormal results are displayed)  Labs Reviewed - No data to display   RADIOLOGY  Left femur x-ray per radiologist shows no foreign body, fracture. I, Johnn Hai, personally viewed and evaluated these images (plain radiographs) as part of my medical decision making, as well as reviewing the written report by the radiologist. ____________________________________________   PROCEDURES  Procedure(s) performed: None  Procedures  Critical Care performed: No  ____________________________________________   INITIAL IMPRESSION / ASSESSMENT AND PLAN / ED COURSE  Pertinent labs & imaging results that were available during my care of the patient were reviewed by me and considered in my medical decision making (see chart for details).    Clinical Course  Patient spoke with animal control while in the emergency room. They currently have all her contact information and have gone to her house to look for this dog. We have talked about in the event that they are not able to find the dog patient has +72 hours before initiation of rabies vaccine. Patient was started on Augmentin 875 twice a day for 7 days along with Norco as needed for pain. Patient is follow-up with her primary care doctor at [facility] if any signs of infection or continued problems. She also is aware that animal control will be calling her in the event that they cannot find the dog for her to initiate rabies vaccine  injections.   ____________________________________________   FINAL CLINICAL IMPRESSION(S) / ED DIAGNOSES  Final diagnoses:  Dog bite of thigh, left, initial encounter      NEW MEDICATIONS STARTED DURING THIS VISIT:  New Prescriptions   AMOXICILLIN-CLAVULANATE (AUGMENTIN) 875-125 MG TABLET    Take 1 tablet by mouth 2 (two) times daily.   HYDROCODONE-ACETAMINOPHEN (NORCO/VICODIN) 5-325 MG TABLET    Take 1 tablet by mouth every 4 (four) hours as needed for moderate pain.     Note:  This document was prepared using Dragon voice recognition software and may include unintentional dictation errors.    Johnn Hai, PA-C 02/23/16 1443    Rudene Re, MD 02/23/16 [phone]

## 2016-02-24 ENCOUNTER — Telehealth: Payer: Self-pay | Admitting: *Deleted

## 2016-02-24 NOTE — Telephone Encounter (Signed)
Called to report that she got bit by a [employer] dog and animal control cannot find the dog, so she will have to go through Rabies series of injections. She is asking how these injections will affect her options for cancer treatment. Please advise

## 2016-02-25 ENCOUNTER — Ambulatory Visit: Payer: Self-pay | Admitting: Cardiothoracic Surgery

## 2016-02-25 NOTE — Telephone Encounter (Signed)
I checked with Dr. Mike Gip and after review of notes. Pt getiing rabies shots should not interfere with anything going on with her cancer care.  She is awaiting PFT test so that dr Genevive Bi can determine about surgery vs bronch vs radiation.  Pt contacted and told above and was also advised about speaking to her PCP to make sure she does not need f/u from bite or the vaccinations. She will call PCP. She does have later appt in August

## 2016-02-25 NOTE — Telephone Encounter (Signed)
  Please call patient.  I am unsure.  I will need to discuss with pharmacy.  M

## 2016-02-26 ENCOUNTER — Emergency Department
Admission: EM | Admit: 2016-02-26 | Discharge: 2016-02-26 | Disposition: A | Discharge status: Home / Self Care | Payer: BLUE CROSS/BLUE SHIELD | Attending: Emergency Medicine | Admitting: Emergency Medicine

## 2016-02-26 DIAGNOSIS — Z23 Encounter for immunization: Secondary | ICD-10-CM | POA: Insufficient documentation

## 2016-02-26 DIAGNOSIS — I2510 Atherosclerotic heart disease of native coronary artery without angina pectoris: Secondary | ICD-10-CM | POA: Diagnosis not present

## 2016-02-26 DIAGNOSIS — F17210 Nicotine dependence, cigarettes, uncomplicated: Secondary | ICD-10-CM | POA: Diagnosis not present

## 2016-02-26 DIAGNOSIS — Z203 Contact with and (suspected) exposure to rabies: Secondary | ICD-10-CM

## 2016-02-26 MED ORDER — RABIES IMMUNE GLOBULIN 150 UNIT/ML IM INJ
20.0000 [IU]/kg | INJECTION | Freq: Once | INTRAMUSCULAR | Status: AC
  Administered 2016-02-26: 1050 [IU] via INTRAMUSCULAR
  Filled 2016-02-26: qty 8

## 2016-02-26 MED ORDER — RABIES VACCINE, PCEC IM SUSR
1.0000 mL | Freq: Once | INTRAMUSCULAR | Status: AC
  Administered 2016-02-26: 1 mL via INTRAMUSCULAR
  Filled 2016-02-26: qty 1

## 2016-02-26 NOTE — Discharge Instructions (Signed)
You have received your initial rabies vaccination today. He will need to return on day #3, day #7 and a #14.

## 2016-02-26 NOTE — ED Provider Notes (Signed)
[facility] Emergency Department Provider Note  ____________________________________________  Time seen: Approximately 8:42 AM  I have reviewed the triage vital signs and the nursing notes.   HISTORY  Chief Complaint Rabies Injection    HPI Sheila Suarez is a 60 y.o. female who presents for evaluation of rabies vaccination. Patient denies any complaints from the initial series. Patient wishes to initiate her rabies vaccination this time. She was notified by animal control that the dog could not be located are found and encouraged her to get vaccinated.   Past Medical History:  Diagnosis Date  . Coronary artery disease   . Lupus [facility])     Patient Active Problem List   Diagnosis Date Noted  . Weight loss 02/09/2016  . Nodule of left lung 02/07/2016    Past Surgical History:  Procedure Laterality Date  . CYST EXCISION Left   . TUBAL LIGATION Bilateral    37 years ago    Prior to Admission medications   Medication Sig Start Date End Date Taking? Authorizing Provider  amoxicillin-clavulanate (AUGMENTIN) 875-125 MG tablet Take 1 tablet by mouth 2 (two) times daily. 02/23/16 03/01/16  Johnn Hai, PA-C  HYDROcodone-acetaminophen (NORCO/VICODIN) 5-325 MG tablet Take 1 tablet by mouth every 4 (four) hours as needed for moderate pain. 02/23/16   Johnn Hai, PA-C  traMADol (ULTRAM) 50 MG tablet Take 1 tablet (50 mg total) by mouth every 6 (six) hours as needed for moderate pain. 02/07/16   Harvest Dark, MD    Allergies Review of patient's allergies indicates no known allergies.  Family History  Problem Relation Age of Onset  . Pancreatic cancer Sister   . Lung cancer Sister   . Lung cancer Brother   . Stomach cancer Paternal Grandmother   . Heart disease Father   . Cancer Daughter 14    cervical  . Cancer Maternal Aunt     brain  . Cancer Maternal Uncle     prostate  . Cancer Maternal Aunt     bone    Social History Social  History  Substance Use Topics  . Smoking status: Current Every Day Smoker    Packs/day: 0.50    Years: 35.00    Types: Cigarettes  . Smokeless tobacco: Never Used  . Alcohol use 0.6 oz/week    1 Cans of beer per week     Comment: occasional    Review of Systems Constitutional: No fever/chills Eyes: No visual changes. ENT: No sore throat. Cardiovascular: Denies chest pain. Respiratory: Denies shortness of breath. Gastrointestinal: No abdominal pain.  No nausea, no vomiting.  No diarrhea.  No constipation. Genitourinary: Negative for dysuria. Musculoskeletal: Negative for back pain. Skin: Negative for rash. Neurological: Negative for headaches, focal weakness or numbness.  10-point ROS otherwise negative.  ____________________________________________   PHYSICAL EXAM:  VITAL SIGNS:BP (!) 141/68 (BP Location: Right Arm)   Pulse (!) 58   Temp 98 F (36.7 C) (Oral)   Resp 20   Ht '5\' 5"'$  (1.651 m)   Wt 50.8 kg   SpO2 100%   BMI 18.64 kg/m   ED Triage Vitals  Enc Vitals Group     BP      Pulse      Resp      Temp      Temp src      SpO2      Weight      Height      Head Circumference  Peak Flow      Pain Score      Pain Loc      Pain Edu?      Excl. in [place]?     Constitutional: Alert and oriented. Well appearing and in no acute distress. Head: Atraumatic. Nose: No congestion/rhinnorhea. Mouth/Throat: Mucous membranes are moist.  Oropharynx non-erythematous. Neck: No stridor.   Cardiovascular: Normal rate, regular rhythm. Grossly normal heart sounds.  Good peripheral circulation. Respiratory: Normal respiratory effort.  No retractions. Lungs CTAB. Gastrointestinal: Soft and nontender. No distention. No CVA tenderness. Musculoskeletal: No lower extremity tenderness nor edema.  No joint effusions. Neurologic:  Normal speech and language. No gross focal neurologic deficits are appreciated. No gait instability. Skin:  Skin is warm, dry and intact. No rash  noted. Psychiatric: Mood and affect are normal. Speech and behavior are normal.  ____________________________________________   LABS (all labs ordered are listed, but only abnormal results are displayed)  Labs Reviewed - No data to display ____________________________________________  EKG   ____________________________________________  RADIOLOGY   ____________________________________________   PROCEDURES  Procedure(s) performed: None  Critical Care performed: No  ____________________________________________   INITIAL IMPRESSION / ASSESSMENT AND PLAN / ED COURSE  Pertinent labs & imaging results that were available during my care of the patient were reviewed by me and considered in my medical decision making (see chart for details).    Clinical Course  Patient tolerated rabies vaccination prophylaxis extremely well. She is to return on day number for the second in the series. She voices no other emergency medical complaints at this time. ____________________________________________   FINAL CLINICAL IMPRESSION(S) / ED DIAGNOSES  Final diagnoses:  Need for post exposure prophylaxis for rabies     This chart was dictated using voice recognition software/Dragon. Despite best efforts to proofread, errors can occur which can change the meaning. Any change was purely unintentional.    Arlyss Repress, PA-C 02/26/16 0852    Arlyss Repress, PA-C 02/26/16 4259    Rudene Re, MD 02/26/16 [id number]

## 2016-02-26 NOTE — ED Notes (Signed)
NAD noted at time of D/C. Pt denies questions or concerns. Pt ambulatory to the lobby at this time.  

## 2016-02-26 NOTE — ED Triage Notes (Signed)
Pt states she was bit by unknown dog on Tuesday and states animal control called her yesterday and told her to come here for the rabies series because they were unable to find the dog.

## 2016-02-28 DIAGNOSIS — R0602 Shortness of breath: Secondary | ICD-10-CM | POA: Insufficient documentation

## 2016-02-28 DIAGNOSIS — I2510 Atherosclerotic heart disease of native coronary artery without angina pectoris: Secondary | ICD-10-CM | POA: Insufficient documentation

## 2016-02-29 ENCOUNTER — Ambulatory Visit (HOSPITAL_COMMUNITY): Payer: BLUE CROSS/BLUE SHIELD

## 2016-02-29 ENCOUNTER — Encounter: Payer: Self-pay | Admitting: Emergency Medicine

## 2016-02-29 ENCOUNTER — Emergency Department
Admission: EM | Admit: 2016-02-29 | Discharge: 2016-02-29 | Disposition: A | Discharge status: Home / Self Care | Payer: BLUE CROSS/BLUE SHIELD | Attending: Emergency Medicine | Admitting: Emergency Medicine

## 2016-02-29 DIAGNOSIS — R634 Abnormal weight loss: Secondary | ICD-10-CM

## 2016-02-29 DIAGNOSIS — F17210 Nicotine dependence, cigarettes, uncomplicated: Secondary | ICD-10-CM | POA: Diagnosis not present

## 2016-02-29 DIAGNOSIS — R911 Solitary pulmonary nodule: Secondary | ICD-10-CM

## 2016-02-29 DIAGNOSIS — Z203 Contact with and (suspected) exposure to rabies: Secondary | ICD-10-CM | POA: Diagnosis present

## 2016-02-29 DIAGNOSIS — I2510 Atherosclerotic heart disease of native coronary artery without angina pectoris: Secondary | ICD-10-CM | POA: Diagnosis not present

## 2016-02-29 DIAGNOSIS — R918 Other nonspecific abnormal finding of lung field: Secondary | ICD-10-CM

## 2016-02-29 DIAGNOSIS — J449 Chronic obstructive pulmonary disease, unspecified: Secondary | ICD-10-CM | POA: Insufficient documentation

## 2016-02-29 MED ORDER — RABIES VACCINE, PCEC IM SUSR
INTRAMUSCULAR | Status: AC
  Filled 2016-02-29: qty 1

## 2016-02-29 MED ORDER — RABIES VACCINE, PCEC IM SUSR
1.0000 mL | Freq: Once | INTRAMUSCULAR | Status: AC
  Administered 2016-02-29: 1 mL via INTRAMUSCULAR

## 2016-02-29 NOTE — Discharge Instructions (Signed)
Return on day 7 for your next injection. Which will be in 4 days.

## 2016-02-29 NOTE — ED Triage Notes (Signed)
Pt returned for second rabies

## 2016-02-29 NOTE — ED Triage Notes (Signed)
Pt to ed for rabies vaccine.

## 2016-02-29 NOTE — ED Notes (Signed)
Pt here for day 3 rabies vaccination.

## 2016-02-29 NOTE — ED Provider Notes (Signed)
[facility] Emergency Department Provider Note  __________________________________________   None    (approximate)  I have reviewed the triage vital signs and the nursing notes.   HISTORY  Chief Complaint Rabies Injection    HPI [name] is a 60 y.o. female who is here for her second rabies injection. Patient had both initial rabies injections done this past Saturday. This will be day 3 for the patient. Patient is still taking the antibiotics given and has no complaints. Patient states that she was bit by a pit bull, but they never found the dog.   Past Medical History:  Diagnosis Date  . Coronary artery disease   . Lupus [facility])     Patient Active Problem List   Diagnosis Date Noted  . Weight loss 02/09/2016  . Nodule of left lung 02/07/2016    Past Surgical History:  Procedure Laterality Date  . CYST EXCISION Left   . TUBAL LIGATION Bilateral    37 years ago    Prior to Admission medications   Medication Sig Start Date End Date Taking? Authorizing Provider  amoxicillin-clavulanate (AUGMENTIN) 875-125 MG tablet Take 1 tablet by mouth 2 (two) times daily. 02/23/16 03/01/16  Johnn Hai, PA-C  HYDROcodone-acetaminophen (NORCO/VICODIN) 5-325 MG tablet Take 1 tablet by mouth every 4 (four) hours as needed for moderate pain. 02/23/16   Johnn Hai, PA-C  traMADol (ULTRAM) 50 MG tablet Take 1 tablet (50 mg total) by mouth every 6 (six) hours as needed for moderate pain. 02/07/16   Harvest Dark, MD    Allergies Review of patient's allergies indicates no known allergies.  Family History  Problem Relation Age of Onset  . Pancreatic cancer Sister   . Lung cancer Sister   . Lung cancer Brother   . Stomach cancer Paternal Grandmother   . Heart disease Father   . Cancer Daughter 11    cervical  . Cancer Maternal Aunt     brain  . Cancer Maternal Uncle     prostate  . Cancer Maternal Aunt     bone    Social  History Social History  Substance Use Topics  . Smoking status: Current Every Day Smoker    Packs/day: 0.50    Years: 35.00    Types: Cigarettes  . Smokeless tobacco: Never Used  . Alcohol use 0.6 oz/week    1 Cans of beer per week     Comment: occasional    Review of Systems Constitutional: No fever/chills Eyes: No visual changes. ENT: No sore throat. Cardiovascular: Denies chest pain. Respiratory: Denies shortness of breath. Gastrointestinal: No abdominal pain.  No nausea, no vomiting.  No diarrhea.  No constipation. Genitourinary: Negative for dysuria. Musculoskeletal: Negative for back pain. Skin: Negative for rash. Healing bite wounds to the left upper anterior thigh. Neurological: Negative for headaches, focal weakness or numbness.  10-point ROS otherwise negative.  ____________________________________________   PHYSICAL EXAM:  VITAL SIGNS: ED Triage Vitals  Enc Vitals Group     BP 02/29/16 0933 (!) 151/70     Pulse Rate 02/29/16 0933 (!) 54     Resp 02/29/16 0933 16     Temp 02/29/16 0933 97.5 F (36.4 C)     Temp Source 02/29/16 0933 Oral     SpO2 02/29/16 0933 100 %     Weight 02/29/16 0933 114 lb (51.7 kg)     Height 02/29/16 0933 '5\' 5"'$  (1.651 m)     Head Circumference --  Peak Flow --      Pain Score 02/29/16 0930 0     Pain Loc --      Pain Edu? --      Excl. in [place]? --     Constitutional: Alert and oriented. Well appearing and in no acute distress. Eyes: Conjunctivae are normal. PERRL. EOMI. Head: Atraumatic. Nose: No congestion/rhinnorhea. Mouth/Throat: Mucous membranes are moist.  Oropharynx non-erythematous. Neck: No stridor.   Cardiovascular: Normal rate, regular rhythm. Grossly normal heart sounds.  Good peripheral circulation. Respiratory: Normal respiratory effort.  No retractions. Lungs CTAB. Gastrointestinal: Soft and nontender. No distention. No abdominal bruits. No CVA tenderness. Musculoskeletal: No lower extremity tenderness  nor edema.  No joint effusions. Neurologic:  Normal speech and language. No gross focal neurologic deficits are appreciated. No gait instability. Skin:  Skin is warm, dry and intact. No rash noted.Well-healed puncture wounds to her left anterior mid thigh. No evidence of any redness or drainage. Psychiatric: Mood and affect are normal. Speech and behavior are normal.  ____________________________________________   LABS (all labs ordered are listed, but only abnormal results are displayed)  Labs Reviewed - No data to display ____________________________________________  EKG  None ____________________________________________  RADIOLOGY None  ____________________________________________   PROCEDURES  Procedure(s) performed: None  Procedures  Critical Care performed: No  ____________________________________________   INITIAL IMPRESSION / ASSESSMENT AND PLAN / ED COURSE  Pertinent labs & imaging results that were available during my care of the patient were reviewed by me and considered in my medical decision making (see chart for details).  10 AM Patient to get her third rabies injection and will return on day 7 for her next injection.  Clinical Course     ____________________________________________   FINAL CLINICAL IMPRESSION(S) / ED DIAGNOSES  Final diagnoses:  Need for post exposure prophylaxis for rabies      NEW MEDICATIONS STARTED DURING THIS VISIT:  New Prescriptions   No medications on file     Note:  This document was prepared using Dragon voice recognition software and may include unintentional dictation errors.    Ruby Cola, MD 02/29/16 1030

## 2016-03-03 ENCOUNTER — Ambulatory Visit (INDEPENDENT_AMBULATORY_CARE_PROVIDER_SITE_OTHER): Payer: BLUE CROSS/BLUE SHIELD | Admitting: Cardiothoracic Surgery

## 2016-03-03 ENCOUNTER — Ambulatory Visit
Admission: RE | Admit: 2016-03-03 | Discharge: 2016-03-03 | Disposition: A | Discharge status: Home / Self Care | Payer: BLUE CROSS/BLUE SHIELD | Source: Ambulatory Visit | Attending: Cardiothoracic Surgery | Admitting: Cardiothoracic Surgery

## 2016-03-03 ENCOUNTER — Encounter: Payer: Self-pay | Admitting: Cardiothoracic Surgery

## 2016-03-03 VITALS — BP 147/82 | HR 67 | Temp 98.7°F | Wt 112.0 lb

## 2016-03-03 DIAGNOSIS — R911 Solitary pulmonary nodule: Secondary | ICD-10-CM | POA: Diagnosis not present

## 2016-03-03 DIAGNOSIS — R918 Other nonspecific abnormal finding of lung field: Secondary | ICD-10-CM | POA: Diagnosis not present

## 2016-03-03 LAB — BLOOD GAS, ARTERIAL
ALLENS TEST (PASS/FAIL): POSITIVE — AB
Acid-base deficit: 1.3 mmol/L (ref 0.0–2.0)
Bicarbonate: 23.7 mEq/L (ref 21.0–28.0)
FIO2: 0.21
O2 SAT: 96.2 %
PATIENT TEMPERATURE: 37
PCO2 ART: 40 mmHg (ref 32.0–48.0)
pH, Arterial: 7.38 (ref 7.350–7.450)
pO2, Arterial: 85 mmHg (ref 83.0–108.0)

## 2016-03-03 NOTE — Patient Instructions (Signed)
Please give us a call if you have any questions or concerns. 

## 2016-03-03 NOTE — Progress Notes (Signed)
[name]patient Post-Op Note  Patient ID: Sheila Suarez, female   DOB: 23-Oct-1955, 60 y.o.   MRN: [id number]  HISTORY: She returns today in follow-up. She states that the hot weather does make her more short of breath. She did have her pulmonary function studies done. FEV1 was 84% and her DLCO was 67%. She is scheduled to follow-up with Dr. Mike Gip next week and Dr. Nehemiah Massed towards the end of the month.   Vitals:   03/03/16 1039  BP: (!) 147/82  Pulse: 67  Temp: 98.7 F (37.1 C)     EXAM: Resp: Lungs are clear bilaterally.  No respiratory distress, normal effort. Heart:  Regular without murmurs Abd:  Abdomen is soft, non distended and non tender. No masses are palpable.  There is no rebound and no guarding.  Neurological: Alert and oriented to person, place, and time. Coordination normal.  Skin: Skin is warm and dry. No rash noted. No diaphoretic. No erythema. No pallor.  Psychiatric: Normal mood and affect. Normal behavior. Judgment and thought content normal.    ASSESSMENT: I have independently reviewed her CT scan or PET scan and her pulmonary function studies. I had a long discussion with her regarding the options.   PLAN:   After extensive discussion with the patient she would like to review her options with Dr. Mike Gip. I did explain to her that we could perform a needle biopsy or an navigational bronchoscopy. Also told her we could proceed directly to surgery if she refused biopsy. She understands that if this is an early stage lung cancer that surgical intervention is more likely to be successful in the long run. However this does have significant complications that radiation therapy does not have. I explained her the risks of bleeding, infection, air leak and death. She will review her options with Dr. Mike Gip we will see her back in 2 weeks' time.    [name], MD

## 2016-03-04 ENCOUNTER — Encounter: Payer: Self-pay | Admitting: Emergency Medicine

## 2016-03-04 ENCOUNTER — Emergency Department
Admission: EM | Admit: 2016-03-04 | Discharge: 2016-03-04 | Disposition: A | Discharge status: Home / Self Care | Payer: BLUE CROSS/BLUE SHIELD | Attending: Student | Admitting: Student

## 2016-03-04 DIAGNOSIS — Z791 Long term (current) use of non-steroidal anti-inflammatories (NSAID): Secondary | ICD-10-CM | POA: Insufficient documentation

## 2016-03-04 DIAGNOSIS — I2510 Atherosclerotic heart disease of native coronary artery without angina pectoris: Secondary | ICD-10-CM | POA: Insufficient documentation

## 2016-03-04 DIAGNOSIS — F17210 Nicotine dependence, cigarettes, uncomplicated: Secondary | ICD-10-CM | POA: Diagnosis not present

## 2016-03-04 DIAGNOSIS — Z23 Encounter for immunization: Secondary | ICD-10-CM | POA: Diagnosis present

## 2016-03-04 MED ORDER — RABIES VACCINE, PCEC IM SUSR
1.0000 mL | Freq: Once | INTRAMUSCULAR | Status: AC
  Administered 2016-03-04: 1 mL via INTRAMUSCULAR
  Filled 2016-03-04: qty 1

## 2016-03-04 NOTE — Discharge Instructions (Signed)
Return for remaining rabies injections.

## 2016-03-04 NOTE — ED Provider Notes (Signed)
[facility] Emergency Department Provider Note   ____________________________________________   First MD Initiated Contact with Patient 03/04/16 1043     (approximate)  I have reviewed the triage vital signs and the nursing notes.   HISTORY  Chief Complaint Rabies Injection    HPI [name] is a 60 y.o. female is here for rabies vaccine. This will be her third injection. She denies any difficulty with the injections and denies any complaints. Patient denies any pain.   Past Medical History:  Diagnosis Date  . Coronary artery disease   . Lupus [facility])     Patient Active Problem List   Diagnosis Date Noted  . Weight loss 02/09/2016  . Nodule of left lung 02/07/2016    Past Surgical History:  Procedure Laterality Date  . CYST EXCISION Left   . TUBAL LIGATION Bilateral    37 years ago    Prior to Admission medications   Medication Sig Start Date End Date Taking? Authorizing Provider  HYDROcodone-acetaminophen (NORCO/VICODIN) 5-325 MG tablet Take 1 tablet by mouth every 4 (four) hours as needed for moderate pain. 02/23/16   Johnn Hai, PA-C  traMADol (ULTRAM) 50 MG tablet Take 1 tablet (50 mg total) by mouth every 6 (six) hours as needed for moderate pain. 02/07/16   Harvest Dark, MD    Allergies Review of patient's allergies indicates no known allergies.  Family History  Problem Relation Age of Onset  . Pancreatic cancer Sister   . Lung cancer Sister   . Lung cancer Brother   . Stomach cancer Paternal Grandmother   . Heart disease Father   . Cancer Daughter 92    cervical  . Cancer Maternal Aunt     brain  . Cancer Maternal Uncle     prostate  . Cancer Maternal Aunt     bone    Social History Social History  Substance Use Topics  . Smoking status: Current Every Day Smoker    Packs/day: 0.25    Years: 35.00    Types: Cigarettes  . Smokeless tobacco: Never Used  . Alcohol use 0.6 oz/week    1 Cans of beer  per week     Comment: occasional    Review of Systems Constitutional: No fever/chills Cardiovascular: Denies chest pain. Respiratory: Denies shortness of breath. Musculoskeletal: Negative for muscle or joint pain. Skin: Negative for infection Neurological: Negative for headaches, focal weakness or numbness.  10-point ROS otherwise negative.  ____________________________________________   PHYSICAL EXAM:  VITAL SIGNS: ED Triage Vitals [03/04/16 1014]  Enc Vitals Group     BP 128/79     Pulse Rate (!) 57     Resp 18     Temp 97.6 F (36.4 C)     Temp Source Oral     SpO2 100 %     Weight 112 lb (50.8 kg)     Height '5\' 5"'$  (1.651 m)     Head Circumference      Peak Flow      Pain Score      Pain Loc      Pain Edu?      Excl. in [place]?     Constitutional: Alert and oriented. Well appearing and in no acute distress. Eyes: Conjunctivae are normal. PERRL. EOMI. Head: Atraumatic. Nose: No congestion/rhinnorhea. Neck: No stridor.   Cardiovascular: Normal rate, regular rhythm. Grossly normal heart sounds.  Good peripheral circulation. Respiratory: Normal respiratory effort.  No retractions. Lungs CTAB. Musculoskeletal: Moves upper  and lower extremities without any difficulty and normal gait was noted. Neurologic:  Normal speech and language. No gross focal neurologic deficits are appreciated. No gait instability. Skin:  Skin is warm, dry and intact. No evidence of infection was seen. Psychiatric: Mood and affect are normal. Speech and behavior are normal.  ____________________________________________   LABS (all labs ordered are listed, but only abnormal results are displayed)  Labs Reviewed - No data to display  PROCEDURES  Procedure(s) performed: None  Procedures  Critical Care performed: No  ____________________________________________   INITIAL IMPRESSION / ASSESSMENT AND PLAN / ED COURSE  Pertinent labs & imaging results that were available during my care  of the patient were reviewed by me and considered in my medical decision making (see chart for details).    Clinical Course   Patient will return for remaining rabies immunizations. Patient states that they didn't find the pit bull that bit her.  There is no signs of infection and patient will continue to keep the area clean and dry.  ____________________________________________   FINAL CLINICAL IMPRESSION(S) / ED DIAGNOSES  Final diagnoses:  Need for immunization against rabies      NEW MEDICATIONS STARTED DURING THIS VISIT:  New Prescriptions   No medications on file     Note:  This document was prepared using Dragon voice recognition software and may include unintentional dictation errors.    Johnn Hai, PA-C 03/04/16 1053    Joanne Gavel, MD 03/04/16 [id number]

## 2016-03-04 NOTE — ED Triage Notes (Signed)
Here for 3rd rabies series.

## 2016-03-04 NOTE — Progress Notes (Signed)
[facility] day:  03/06/16  Chief Complaint: Sheila Suarez is a 60 y.o. female with lupus and a left upper lobe nodule who is seen for 2 week assessment.  HPI: The patient was last seen in the medical oncology clinic on 02/18/2016.  At that time, PET scan revealed an irregular 1.0 cm apical LUL pulmonary nodule and 3 small (3-4 mm) nodules in the right lung.  She was referred to Dr. Genevive Suarez, cardiothoracic surgery.  Pulmonary function studies were scheduled.  She was presented at tumor board.  She contacted the clinic during the interim.  She was bit by a pit bull.  Animal control was looking for the dog.  She was preparing for rabies series of vaccinations.  She saw Dr. Genevive Suarez on 03/03/2016.  PFTs revealed an FEV1 was 84% and her DLCO was 67%.  Discussions were held regarding surgery or biopsy (needle or navigational bronchoscopy).    During the interim, she has had her rabies injections. She recently had her seventh shot.  She has had some diarrhea. She states that she quit smoking last week. She has not felt as good since her shots.   Past Medical History:  Diagnosis Date  . Coronary artery disease   . Lupus [facility])     Past Surgical History:  Procedure Laterality Date  . CYST EXCISION Left   . TUBAL LIGATION Bilateral    37 years ago    Family History  Problem Relation Age of Onset  . Pancreatic cancer Sister   . Lung cancer Sister   . Lung cancer Brother   . Stomach cancer Paternal Grandmother   . Heart disease Father   . Cancer Daughter 41    cervical  . Cancer Maternal Aunt     brain  . Cancer Maternal Uncle     prostate  . Cancer Maternal Aunt     bone    Social History:  reports that she has been smoking Cigarettes.  She has a 8.75 pack-year smoking history. She has never used smokeless tobacco. She reports that she drinks about 0.6 oz of alcohol per week . She reports that she does not use drugs.  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  The patient is alone today.  Allergies: No Known Allergies  Current Medications: Current Outpatient Prescriptions  Medication Sig Dispense Refill  . HYDROcodone-acetaminophen (NORCO/VICODIN) 5-325 MG tablet Take 1 tablet by mouth every 4 (four) hours as needed for moderate pain. 15 tablet 0  . traMADol (ULTRAM) 50 MG tablet Take 1 tablet (50 mg total) by mouth every 6 (six) hours as needed for moderate pain. 20 tablet 0   No current facility-administered medications for this visit.     Review of Systems:  GENERAL:  Feels "not as good" compared to prior to injections.  No fevers.  Weight loss of 2 pounds since last visit. PERFORMANCE STATUS (ECOG):  1 HEENT:  Poor vision (near and far).  Needs glasses.  No runny nose, sore throat, mouth sores or tenderness. Lungs: Shortness of breath with exertion.  No cough.  Little phlegm.  No hemoptysis. Cardiac:  No chest pain, palpitations, orthopnea, or PND. GI:  Diarrhea.  No nausea, vomiting, constipation, melena or hematochezia. GU:  No urgency, frequency, dysuria, or hematuria. Musculoskeletal:  No back pain.  No joint pain.  No muscle tenderness. Extremities:  No pain or swelling. Skin:  No rashes or skin changes. Neuro:  No headache, numbness  or weakness, balance or coordination issues. Endocrine:  No diabetes, thyroid issues, hot flashes or night sweats. Psych:  No mood changes, depression or anxiety. Pain:  No focal pain. Review of systems:  All other systems reviewed and found to be negative.  Physical Exam: Blood pressure 121/68, pulse (!) 54, temperature (!) 96.8 F (36 C), temperature source Tympanic, resp. rate 18, weight 116 lb 1.2 oz (52.7 kg), SpO2 100 %. GENERAL:  Thin woman sitting comfortably in the exam room in no acute distress. MENTAL STATUS:  Alert and oriented to person, place and time. HEAD:  Wearing a black cap. Long brown braids.  Normocephalic, atraumatic, face symmetric, no Cushingoid  features. EYES:  Glasses.  Brown eyes.  No conjunctivitis or scleral icterus. NEUROLOGICAL: Unremarkable. PSYCH:  Appropriate.   No visits with results within 3 Day(s) from this visit.  Latest known visit with results is:  Hospital Outpatient Visit on 03/03/2016  Component Date Value Ref Range Status  . FIO2 03/03/2016 0.21   Final  . pH, Arterial 03/03/2016 7.38  7.350 - 7.450 Final  . pCO2 arterial 03/03/2016 40  32.0 - 48.0 mmHg Final  . pO2, Arterial 03/03/2016 85  83.0 - 108.0 mmHg Final  . Bicarbonate 03/03/2016 23.7  21.0 - 28.0 mEq/L Final  . Acid-base deficit 03/03/2016 1.3  0.0 - 2.0 mmol/L Final  . O2 Saturation 03/03/2016 96.2  % Final  . Patient temperature 03/03/2016 37.0   Final  . Collection site 03/03/2016 RIGHT BRACHIAL   Final  . Sample type 03/03/2016 ARTERIAL DRAW   Final  . Allens test (pass/fail) 03/03/2016 POSITIVE* PASS Final    Assessment:  Sheila Suarez is a 60 y.o. female with lupus x 8 years.  CT angiogram of the chest, abdominal and pelvis on 02/17/2016 revealed a 1.1 cm spiculated lesion in the left upper lobe increased from prior exam (08/30/2010). There were stable small pulmonary nodules bilaterally (2-3 mm).  She has a 15 pack year smoking history.  PET scan on 02/15/2016 revealed an irregular 1.0 cm apical LUL pulmonary nodule (SUV 0.7).  There was a 4 mm right middle lobe solid pulmonary nodule (stable since 08/02/2010) and two additional right lower lobe 3 mm pulmonary nodules.  PFTs revealed an FEV1 was 84% and her DLCO was 67%.  She has received the series of rabies injections after being bitten by a stray dog.  She has never had a colonoscopy.    Symptomatically, she has lost 16 pounds over an unclear amount of time.  Thyroid function studies were normal.  Exam reveals no adenopathy or hepatosplenomegaly.  Plan:  1.  Discuss consult with Dr. Genevive Suarez. 2.  Present at tumor board. 3.  Phone follow-up with patient after tumor board.  4.  RTC in  1 month for MD assessment.   Sheila Asal, MD  03/06/2016, 9:23 AM

## 2016-03-06 ENCOUNTER — Encounter: Payer: Self-pay | Admitting: Hematology and Oncology

## 2016-03-06 ENCOUNTER — Inpatient Hospital Stay: Payer: BLUE CROSS/BLUE SHIELD | Attending: Hematology and Oncology | Admitting: Hematology and Oncology

## 2016-03-06 VITALS — BP 121/68 | HR 54 | Temp 96.8°F | Resp 18 | Wt 116.1 lb

## 2016-03-06 DIAGNOSIS — R918 Other nonspecific abnormal finding of lung field: Secondary | ICD-10-CM | POA: Diagnosis not present

## 2016-03-06 DIAGNOSIS — M329 Systemic lupus erythematosus, unspecified: Secondary | ICD-10-CM | POA: Diagnosis not present

## 2016-03-06 DIAGNOSIS — Z79899 Other long term (current) drug therapy: Secondary | ICD-10-CM | POA: Insufficient documentation

## 2016-03-06 DIAGNOSIS — Z87828 Personal history of other (healed) physical injury and trauma: Secondary | ICD-10-CM

## 2016-03-06 DIAGNOSIS — Z800 Family history of malignant neoplasm of digestive organs: Secondary | ICD-10-CM

## 2016-03-06 DIAGNOSIS — I2510 Atherosclerotic heart disease of native coronary artery without angina pectoris: Secondary | ICD-10-CM

## 2016-03-06 DIAGNOSIS — Z801 Family history of malignant neoplasm of trachea, bronchus and lung: Secondary | ICD-10-CM | POA: Diagnosis not present

## 2016-03-06 DIAGNOSIS — Z8042 Family history of malignant neoplasm of prostate: Secondary | ICD-10-CM

## 2016-03-06 DIAGNOSIS — R197 Diarrhea, unspecified: Secondary | ICD-10-CM | POA: Insufficient documentation

## 2016-03-06 DIAGNOSIS — R911 Solitary pulmonary nodule: Secondary | ICD-10-CM

## 2016-03-06 DIAGNOSIS — R634 Abnormal weight loss: Secondary | ICD-10-CM | POA: Diagnosis not present

## 2016-03-06 NOTE — Progress Notes (Signed)
Patient is here for follow up, she was just given her seventh rabies shot Saturday. She mentions after getting this shot she has not felt well. She also mentions she tends to feel full and bloated. She also has had diarrhea lately.

## 2016-03-11 ENCOUNTER — Encounter: Payer: Self-pay | Admitting: Emergency Medicine

## 2016-03-11 ENCOUNTER — Emergency Department
Admission: EM | Admit: 2016-03-11 | Discharge: 2016-03-11 | Disposition: A | Discharge status: Home / Self Care | Payer: BLUE CROSS/BLUE SHIELD | Attending: Emergency Medicine | Admitting: Emergency Medicine

## 2016-03-11 DIAGNOSIS — F17210 Nicotine dependence, cigarettes, uncomplicated: Secondary | ICD-10-CM | POA: Insufficient documentation

## 2016-03-11 DIAGNOSIS — I2510 Atherosclerotic heart disease of native coronary artery without angina pectoris: Secondary | ICD-10-CM | POA: Diagnosis not present

## 2016-03-11 DIAGNOSIS — Z794 Long term (current) use of insulin: Secondary | ICD-10-CM | POA: Diagnosis not present

## 2016-03-11 DIAGNOSIS — Z23 Encounter for immunization: Secondary | ICD-10-CM | POA: Insufficient documentation

## 2016-03-11 MED ORDER — RABIES VACCINE, PCEC IM SUSR
1.0000 mL | Freq: Once | INTRAMUSCULAR | Status: AC
  Administered 2016-03-11: 1 mL via INTRAMUSCULAR
  Filled 2016-03-11: qty 1

## 2016-03-11 NOTE — ED Provider Notes (Signed)
[facility] Emergency Department Provider Note  ____________________________________________  Time seen: Approximately 9:03 AM  I have reviewed the triage vital signs and the nursing notes.   HISTORY  Chief Complaint Rabies Injection    HPI Sheila Suarez is a 60 y.o. female , NAD, presents to the rest 24 fourth and final rabies vaccination. Patient states she has tolerated all of the vaccines well without side effects. Has no other complaints today.   Past Medical History:  Diagnosis Date  . Coronary artery disease   . Lupus [facility])     Patient Active Problem List   Diagnosis Date Noted  . Weight loss 02/09/2016  . Nodule of left lung 02/07/2016    Past Surgical History:  Procedure Laterality Date  . CYST EXCISION Left   . TUBAL LIGATION Bilateral    37 years ago    Prior to Admission medications   Medication Sig Start Date End Date Taking? Authorizing Provider  HYDROcodone-acetaminophen (NORCO/VICODIN) 5-325 MG tablet Take 1 tablet by mouth every 4 (four) hours as needed for moderate pain. 02/23/16   Johnn Hai, PA-C  traMADol (ULTRAM) 50 MG tablet Take 1 tablet (50 mg total) by mouth every 6 (six) hours as needed for moderate pain. 02/07/16   Harvest Dark, MD    Allergies Review of patient's allergies indicates no known allergies.  Family History  Problem Relation Age of Onset  . Pancreatic cancer Sister   . Lung cancer Sister   . Lung cancer Brother   . Stomach cancer Paternal Grandmother   . Heart disease Father   . Cancer Daughter 71    cervical  . Cancer Maternal Aunt     brain  . Cancer Maternal Uncle     prostate  . Cancer Maternal Aunt     bone    Social History Social History  Substance Use Topics  . Smoking status: Current Every Day Smoker    Packs/day: 0.25    Years: 35.00    Types: Cigarettes  . Smokeless tobacco: Never Used  . Alcohol use 0.6 oz/week    1 Cans of beer per week     Comment:  occasional     Review of Systems  Constitutional: No fever/chills Cardiovascular: No chest pain. Respiratory:  No shortness of breath.  Musculoskeletal: Negative for general myalgias.  Skin: Negative for rash. Neurological: Negative for headaches, focal weakness or numbness. 10-point ROS otherwise negative.  ____________________________________________   PHYSICAL EXAM:  VITAL SIGNS: ED Triage Vitals  Enc Vitals Group     BP 03/11/16 0902 (!) 137/101     Pulse Rate 03/11/16 0902 (!) 53     Resp 03/11/16 0902 18     Temp 03/11/16 0902 98 F (36.7 C)     Temp Source 03/11/16 0902 Oral     SpO2 03/11/16 0902 100 %     Weight 03/11/16 0858 112 lb (50.8 kg)     Height 03/11/16 0858 '5\' 5"'$  (1.651 m)     Head Circumference --      Peak Flow --      Pain Score 03/11/16 0859 0     Pain Loc --      Pain Edu? --      Excl. in [place]? --      Constitutional: Alert and oriented. Well appearing and in no acute distress. Eyes: Conjunctivae are normal.  Head: Atraumatic. Neck: Supple with full range of motion. Hematological/Lymphatic/Immunilogical: No cervical lymphadenopathy. Cardiovascular: Normal rate, regular rhythm.  Normal S1 and S2.  Good peripheral circulation. Respiratory: Normal respiratory effort without tachypnea or retractions. Lungs CTAB. Musculoskeletal: Full range of motion of upper and lower extremities without difficulty or abnormality. Gait is normal. Neurologic:  Normal speech and language. No gross focal neurologic deficits are appreciated.  Skin:  Skin is warm, dry and intact. No rash noted. Psychiatric: Mood and affect are normal. Speech and behavior are normal. Patient exhibits appropriate insight and judgement.   ____________________________________________    LABS  None ____________________________________________  EKG  None ____________________________________________  RADIOLOGY  None ____________________________________________    PROCEDURES  Procedure(s) performed: None   Procedures   Medications  rabies vaccine (RABAVERT) injection 1 mL (1 mL Intramuscular Given 03/11/16 0907)     ____________________________________________   INITIAL IMPRESSION / ASSESSMENT AND PLAN / ED COURSE  Pertinent labs & imaging results that were available during my care of the patient were reviewed by me and considered in my medical decision making (see chart for details).  Clinical Course    Patient's diagnosis is consistent with need for rabies vaccination. Patient will be discharged home as she has tolerated rabies vaccination without any immediate side effects. Patient is to follow up with her primary care provider if symptoms persist past this treatment course. Patient is given ED precautions to return to the ED for any worsening or new symptoms.    ____________________________________________  FINAL CLINICAL IMPRESSION(S) / ED DIAGNOSES  Final diagnoses:  Need for rabies vaccination      NEW MEDICATIONS STARTED DURING THIS VISIT:  New Prescriptions   No medications on file         Braxton Feathers, PA-C 03/11/16 0915    Harvest Dark, MD 03/11/16 1447

## 2016-03-11 NOTE — ED Triage Notes (Signed)
Pt states she is here for last rabies injection.

## 2016-03-12 ENCOUNTER — Telehealth: Payer: Self-pay | Admitting: Hematology and Oncology

## 2016-03-12 NOTE — Telephone Encounter (Signed)
Re:  Tumor board discussions  The patient was presented at tumor board.  The lesion in her lung would be difficult to biopsy.  Reviewing old films, she appears to have a slowly growing low grade malignancy.  It was recommended that she have surgery to remove the lesion.  The patient states that she just completed the last of the rabies vaccination after being bit by a stray dog.  She has a follow-up appointment with Dr. Genevive Bi on 03/14/2016.   Lequita Asal, MD

## 2016-03-14 ENCOUNTER — Encounter: Payer: Self-pay | Admitting: Cardiothoracic Surgery

## 2016-03-14 ENCOUNTER — Ambulatory Visit (INDEPENDENT_AMBULATORY_CARE_PROVIDER_SITE_OTHER): Payer: BLUE CROSS/BLUE SHIELD | Admitting: Cardiothoracic Surgery

## 2016-03-14 VITALS — BP 120/67 | HR 59 | Temp 98.2°F | Resp 20 | Ht 65.0 in | Wt 114.8 lb

## 2016-03-14 DIAGNOSIS — R918 Other nonspecific abnormal finding of lung field: Secondary | ICD-10-CM

## 2016-03-14 NOTE — Progress Notes (Signed)
[facility]patient Post-Op Note  Patient ID: Lorayne Marek, female   DOB: 20-Sep-1955, 60 y.o.   MRN: [id number]  HISTORY: This patient returns today in follow-up. She has now completed her rabies vaccine. She states that she did see Dr. Mike Gip who recommended that she undergo surgical resection of her left upper lobe lesion. She comes in today to discuss that surgery. Unfortunately she does continue to smoke a few cigarettes per day.   Vitals:   03/14/16 0955  BP: 120/67  Pulse: (!) 59  Resp: 20  Temp: 98.2 F (36.8 C)     EXAM:    Resp: Lungs are clear bilaterally.  No respiratory distress, normal effort. Heart:  Regular without murmurs Abd:  Abdomen is soft, non distended and non tender. No masses are palpable.  There is no rebound and no guarding.  Neurological: Alert and oriented to person, place, and time. Coordination normal.  Skin: Skin is warm and dry. No rash noted. No diaphoretic. No erythema. No pallor.  Psychiatric: Normal mood and affect. Normal behavior. Judgment and thought content normal.    ASSESSMENT: Slowly enlarging left upper lobe lesion most consistent with an early stage lung cancer   PLAN:   I had a long discussion today regarding the options. She does state that she get short of breath when it has been hot outside. I explained to her the indications and risks of left thoracotomy with wedge resection and/or lobectomy she understands the rationale for wedge resection over lobectomy and also the standard to care as a lobectomy. The indications and risks of both were explained to her. She would like to proceed with surgery. She is scheduled to meet with Dr. Nehemiah Massed later this month. We will obtain his clearance. She'll come back to see [place] after her workup with Dr. Nehemiah Massed is finished.    [name], MD

## 2016-03-14 NOTE — Patient Instructions (Signed)
Follow-up as scheduled below.  I will submit Cardiac Clearance to Dr. Nehemiah Massed so that this can be completed at your 03/20/16 scheduled appointment with him. After you have seen Dr. Nehemiah Massed, we will see you back in the office for Pre-op appointment to discuss your surgery and order any further labs/EKG/Chest X-ray needed.  Please call with any questions or concerns.

## 2016-03-20 DIAGNOSIS — R001 Bradycardia, unspecified: Secondary | ICD-10-CM | POA: Insufficient documentation

## 2016-03-23 ENCOUNTER — Telehealth: Payer: Self-pay

## 2016-03-23 NOTE — Telephone Encounter (Signed)
Cardiac was obtained by Dr. Nehemiah Massed and yesterday he faxed [place] cardiac clearance. Patient has an appointment scheduled to see Dr. Genevive Bi on 03/28/2016 to discuss surgery.  Cardiac clearance will be in [place].

## 2016-03-23 NOTE — Telephone Encounter (Signed)
-----   Message from [place], [place] sent at 03/21/2016 11:43 AM EDT ----- Regarding: RE: Cardiac clearance Cardiac clearance was faxed to Dr. Nehemiah Massed on 03/20/2016 again. Awaiting for a response in order for [place] to schedule a follow up appointment with Dr. Genevive Bi so we could schedule her surgery.   ----- Message ----- From: Wayna Chalet, CMA Sent: 03/20/2016   4:28 PM To: Wayna Chalet, CMA Subject: RE: Cardiac clearance                          I faxed her cardiac clearance today. Awaiting for response.   ----- Message ----- From: Wayna Chalet, CMA Sent: 03/20/2016 To: Wayna Chalet, CMA Subject: RE: Cardiac clearance                          Fax cardiac clearance to Dr. Alveria Apley office.   ----- Message ----- From: Wayna Chalet, CMA Sent: 03/03/2016  11:20 AM To: Wayna Chalet, CMA Subject: Cardiac clearance                              Get cardiac clearance for patient from Dr. Nehemiah Massed.

## 2016-03-23 NOTE — Telephone Encounter (Signed)
Cardiac Clearance obtained from Sheila Suarez at this time.

## 2016-03-23 NOTE — Telephone Encounter (Signed)
Cardiac Clearance Obtained and is in Dr. Alveria Apley note from Office Visit on 03/20/16. This can be seen in [place].  Dr. Genevive Bi made aware.

## 2016-03-28 ENCOUNTER — Ambulatory Visit
Admission: RE | Admit: 2016-03-28 | Discharge: 2016-03-28 | Disposition: A | Discharge status: Home / Self Care | Payer: BLUE CROSS/BLUE SHIELD | Source: Ambulatory Visit | Attending: Cardiothoracic Surgery | Admitting: Cardiothoracic Surgery

## 2016-03-28 ENCOUNTER — Other Ambulatory Visit: Payer: Self-pay

## 2016-03-28 ENCOUNTER — Encounter: Payer: Self-pay | Admitting: Cardiothoracic Surgery

## 2016-03-28 ENCOUNTER — Other Ambulatory Visit
Admission: RE | Admit: 2016-03-28 | Discharge: 2016-03-28 | Disposition: A | Discharge status: Home / Self Care | Payer: BLUE CROSS/BLUE SHIELD | Source: Ambulatory Visit | Attending: Cardiothoracic Surgery | Admitting: Cardiothoracic Surgery

## 2016-03-28 ENCOUNTER — Ambulatory Visit (INDEPENDENT_AMBULATORY_CARE_PROVIDER_SITE_OTHER): Payer: BLUE CROSS/BLUE SHIELD | Admitting: Cardiothoracic Surgery

## 2016-03-28 VITALS — BP 129/76 | HR 49 | Temp 97.9°F | Wt 117.0 lb

## 2016-03-28 DIAGNOSIS — R911 Solitary pulmonary nodule: Secondary | ICD-10-CM

## 2016-03-28 DIAGNOSIS — R918 Other nonspecific abnormal finding of lung field: Secondary | ICD-10-CM

## 2016-03-28 LAB — COMPREHENSIVE METABOLIC PANEL
ALBUMIN: 4 g/dL (ref 3.5–5.0)
ALK PHOS: 61 U/L (ref 38–126)
ALT: 17 U/L (ref 14–54)
AST: 18 U/L (ref 15–41)
Anion gap: 5 (ref 5–15)
BILIRUBIN TOTAL: 0.7 mg/dL (ref 0.3–1.2)
BUN: 14 mg/dL (ref 6–20)
CALCIUM: 9.2 mg/dL (ref 8.9–10.3)
CO2: 27 mmol/L (ref 22–32)
Chloride: 107 mmol/L (ref 101–111)
Creatinine, Ser: 0.64 mg/dL (ref 0.44–1.00)
GFR calc Af Amer: >60 mL/min (ref >60)
GFR calc non Af Amer: >60 mL/min (ref >60)
GLUCOSE: 91 mg/dL (ref 65–99)
Potassium: 4.6 mmol/L (ref 3.5–5.1)
Sodium: 139 mmol/L (ref 135–145)
TOTAL PROTEIN: 7.1 g/dL (ref 6.5–8.1)

## 2016-03-28 LAB — CBC
HEMATOCRIT: 37.4 % (ref 35.0–47.0)
HEMOGLOBIN: 12.5 g/dL (ref 12.0–16.0)
MCH: 30.3 pg (ref 26.0–34.0)
MCHC: 33.3 g/dL (ref 32.0–36.0)
MCV: 90.9 fL (ref 80.0–100.0)
Platelets: 324 10*3/uL (ref 150–440)
RBC: 4.12 MIL/uL (ref 3.80–5.20)
RDW: 13.1 % (ref 11.5–14.5)
WBC: 7.3 10*3/uL (ref 3.6–11.0)

## 2016-03-28 LAB — APTT: APTT: 42 s — AB (ref 24–36)

## 2016-03-28 LAB — PROTIME-INR
INR: 0.91
Prothrombin Time: 12.2 seconds (ref 11.4–15.2)

## 2016-03-28 NOTE — Addendum Note (Signed)
Addended by: [name] E on: 03/28/2016 11:45 AM   Modules accepted: SmartSet

## 2016-03-28 NOTE — Progress Notes (Signed)
Patient ID: Sheila Suarez, female   DOB: 1956-06-14, 60 y.o.   MRN: [id number]  Chief Complaint  Patient presents with  . Follow-up    Referred By Dr. Nolon Stalls Reason for Referral left upper lobe mass  HPI Location, Quality, Duration, Severity, Timing, Context, Modifying Factors, Associated Signs and Symptoms.  Sheila Suarez is a 60 y.o. female.  I have been following her for the last several weeks regarding the workup of her left upper lobe mass. She states that she did quit smoking 2 weeks ago and now feels slightly better as she continues to do more activities. She does not complain of any significant chest pain and recently saw Dr. Nehemiah Massed where he gave her cardiac clearance on the basis of an echocardiogram revealing normal left ventricular function and some moderate mitral regurgitation. She saw Dr. Nolon Stalls who is recommended that she undergo surgery for her enlarging left upper lobe mass. I again reviewed with her the CT scan findings. The CT scan has shown the left upper lobe nodule to increase from several millimeters to 10 mm over the last 5 years. I discussed with her the options. These would include percutaneous biopsy, endobronchial biopsy, left thoracotomy and wedge resection and finally left thoracotomy and left upper lobectomy. The patient does not wish to pursue any form of biopsy. She would like to go straight to surgery. She understands the indications and risks of wedge resection versus lobectomy. She understands the oncologic principles behind a lobectomy as compared to a wedge resection. Because of her shortness of breath she would like to attempt a wedge resection if that is technically feasible. I explained her that that may be an inferior cancer operation she would like pursue that if possible. I did review with her all the options and risks. Risks of bleeding, infection, air leak and death were all reviewed. She was encouraged and congratulated on her  tobacco cessation.   Past Medical History:  Diagnosis Date  . Coronary artery disease   . Lupus [facility])     Past Surgical History:  Procedure Laterality Date  . CYST EXCISION Left   . TUBAL LIGATION Bilateral    37 years ago    Family History  Problem Relation Age of Onset  . Pancreatic cancer Sister   . Lung cancer Sister   . Lung cancer Brother   . Stomach cancer Paternal Grandmother   . Heart disease Father   . Cancer Daughter 27    cervical  . Cancer Maternal Aunt     brain  . Cancer Maternal Uncle     prostate  . Cancer Maternal Aunt     bone    Social History Social History  Substance Use Topics  . Smoking status: Current Every Day Smoker    Packs/day: 0.25    Years: 35.00    Types: Cigarettes  . Smokeless tobacco: Never Used  . Alcohol use 0.6 oz/week    1 Cans of beer per week     Comment: Rarely    No Known Allergies  No current outpatient prescriptions on file.   No current facility-administered medications for this visit.       Review of Systems A complete review of systems was asked and was negative except for the following positive findings shortness of breath with exertion  Blood pressure 129/76, pulse (!) 49, temperature 97.9 F (36.6 C), temperature source Oral, weight 117 lb (53.1 kg), SpO2 100 %.  Physical Exam CONSTITUTIONAL:  Pleasant, well-developed, well-nourished,  and in no acute distress. EYES: Pupils equal and reactive to light, Sclera non-icteric EARS, NOSE, MOUTH AND THROAT:  The oropharynx was clear.  Dentition Shows upper and lower dentures.  Oral mucosa pink and moist. LYMPH NODES:  Lymph nodes in the neck and axillae were normal RESPIRATORY:  Lungs were clear.  Normal respiratory effort without pathologic use of accessory muscles of respiration CARDIOVASCULAR: Heart was regular without murmurs.  There were no carotid bruits. GI: The abdomen was soft, nontender, and nondistended. There were no palpable masses. There was  no hepatosplenomegaly. There were normal bowel sounds in all quadrants. GU:  Rectal deferred.   MUSCULOSKELETAL:  Normal muscle strength and tone.  No clubbing or cyanosis.   SKIN:  There were no pathologic skin lesions.  There were no nodules on palpation. NEUROLOGIC:  Sensation is normal.  Cranial nerves are grossly intact. PSYCH:  Oriented to person, place and time.  Mood and affect are normal.  Data Reviewed CT scans and PET scans  I have personally reviewed the patient's imaging, laboratory findings and medical records.    Assessment    Slowly enlarging left upper lobe mass CT scans would suggest that this is a malignancy but PET scanning is negative.    Plan    We will evaluate the patient for left thoracotomy and wedge resection possible lobectomy. She will have her preop performed. All of her questions were answered.       [name], MD 03/28/2016, 9:10 AM

## 2016-03-29 ENCOUNTER — Telehealth: Payer: Self-pay | Admitting: Cardiothoracic Surgery

## 2016-03-29 NOTE — Telephone Encounter (Signed)
Pt advised of pre op date/time and sx date. Sx: 04/17/16 with Dr Genevive Bi with Dr Pat Patrick assisting--Pre op Bronchoscopy, left thoracotomy with left lung resection.  Pre op: 04/14/16 @ 8:15am--Office.   Patient made aware to call [phone], between 1-3:00pm the day before surgery, to find out what time to arrive.

## 2016-03-31 HISTORY — PX: OTHER SURGICAL HISTORY: SHX169

## 2016-04-07 ENCOUNTER — Encounter (INDEPENDENT_AMBULATORY_CARE_PROVIDER_SITE_OTHER): Payer: Self-pay

## 2016-04-07 ENCOUNTER — Inpatient Hospital Stay: Payer: BLUE CROSS/BLUE SHIELD | Attending: Hematology and Oncology | Admitting: Hematology and Oncology

## 2016-04-07 VITALS — BP 138/88 | HR 56 | Temp 95.3°F | Resp 18 | Wt 112.7 lb

## 2016-04-07 DIAGNOSIS — I2510 Atherosclerotic heart disease of native coronary artery without angina pectoris: Secondary | ICD-10-CM | POA: Diagnosis not present

## 2016-04-07 DIAGNOSIS — R911 Solitary pulmonary nodule: Secondary | ICD-10-CM

## 2016-04-07 DIAGNOSIS — Z800 Family history of malignant neoplasm of digestive organs: Secondary | ICD-10-CM | POA: Diagnosis not present

## 2016-04-07 DIAGNOSIS — R918 Other nonspecific abnormal finding of lung field: Secondary | ICD-10-CM

## 2016-04-07 DIAGNOSIS — Z79899 Other long term (current) drug therapy: Secondary | ICD-10-CM | POA: Diagnosis not present

## 2016-04-07 DIAGNOSIS — M329 Systemic lupus erythematosus, unspecified: Secondary | ICD-10-CM | POA: Diagnosis not present

## 2016-04-07 DIAGNOSIS — F17210 Nicotine dependence, cigarettes, uncomplicated: Secondary | ICD-10-CM | POA: Diagnosis not present

## 2016-04-07 DIAGNOSIS — R634 Abnormal weight loss: Secondary | ICD-10-CM

## 2016-04-07 DIAGNOSIS — Z801 Family history of malignant neoplasm of trachea, bronchus and lung: Secondary | ICD-10-CM | POA: Diagnosis not present

## 2016-04-07 DIAGNOSIS — R0602 Shortness of breath: Secondary | ICD-10-CM | POA: Diagnosis not present

## 2016-04-07 DIAGNOSIS — Z87828 Personal history of other (healed) physical injury and trauma: Secondary | ICD-10-CM | POA: Diagnosis not present

## 2016-04-07 DIAGNOSIS — Z8042 Family history of malignant neoplasm of prostate: Secondary | ICD-10-CM | POA: Diagnosis not present

## 2016-04-07 NOTE — Progress Notes (Signed)
[place] [facility] day:  04/07/16  Chief Complaint: Sheila Suarez is a 60 y.o. female with lupus and an enlarging left upper lobe nodule who is seen for 1 month assessment.  HPI: The patient was last seen in the medical oncology clinic on 03/06/2016.  At that time, she felt poorly after her rabies injections.  We discussed options of biopsy or surgery.  She was presented at tumor board.  The lesion in her lung would be difficult to biopsy.  Reviewing old films, she appears to have a slowly growing low grade malignancy.  It was recommended that she have surgery to remove the lesion.  She was seen by Dr. Genevive Bi on 03/28/2016.  She had quit smoking about 2 weeks prior. She was seen by Dr. Nehemiah Massed for cardiac clearance. Echocardiogram revealed a normal ejection fraction and moderate mitral regurgitation. Discussions were held regarding percutaneous biopsy, endobronchial biopsy, and left thoracotomy with wedge resection or left upper lobe lobectomy.  She declined biopsy.  She felt comfortable proceeding with surgery.  Lobectomy versus wedge resection were discussed.   Because of her shortness of breath, she opted for wedge resection if technically feasible.  Surgery is scheduled for 04/17/2016.  Symptomatically, she continues to lose weight.  She states that she is eating all of the time.  She denies any nausea, vomiting or diarrhea.     Past Medical History:  Diagnosis Date  . Coronary artery disease   . Lupus [facility])     Past Surgical History:  Procedure Laterality Date  . CYST EXCISION Left   . TUBAL LIGATION Bilateral    37 years ago    Family History  Problem Relation Age of Onset  . Pancreatic cancer Sister   . Lung cancer Sister   . Lung cancer Brother   . Stomach cancer Paternal Grandmother   . Heart disease Father   . Cancer Daughter 37    cervical  . Cancer Maternal Aunt     brain  . Cancer Maternal Uncle     prostate  . Cancer  Maternal Aunt     bone    Social History:  reports that she has been smoking Cigarettes.  She has a 8.75 pack-year smoking history. She has never used smokeless tobacco. She reports that she drinks about 0.6 oz of alcohol per week . She reports that she does not use drugs.  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  The patient is alone today.  Allergies: No Known Allergies  Current Medications: No current outpatient prescriptions on file.   No current facility-administered medications for this visit.     Review of Systems:  GENERAL:  Feels "ok".  No fevers.  Weight loss of 4 pounds. PERFORMANCE STATUS (ECOG):  1 HEENT:  Poor vision (near and far).  Needs glasses.  No runny nose, sore throat, mouth sores or tenderness. Lungs: Shortness of breath with exertion.  No cough.  No hemoptysis. Cardiac:  No chest pain, palpitations, orthopnea, or PND. GI:  No nausea, vomiting, diarrhea, constipation, melena or hematochezia. GU:  No urgency, frequency, dysuria, or hematuria. Musculoskeletal:  No back pain.  No joint pain.  No muscle tenderness. Extremities:  No pain or swelling. Skin:  No rashes or skin changes. Neuro:  No headache, numbness or weakness, balance or coordination issues. Endocrine:  No diabetes, thyroid issues, hot flashes or night sweats. Psych:  No mood changes, depression or anxiety. Pain:  No focal pain. Review  of systems:  All other systems reviewed and found to be negative.  Physical Exam: Blood pressure 138/88, pulse (!) 56, temperature (!) 95.3 F (35.2 C), temperature source Tympanic, resp. rate 18, weight 112 lb 10.5 oz (51.1 kg). GENERAL:  Thin woman sitting comfortably in the exam room in no acute distress. MENTAL STATUS:  Alert and oriented to person, place and time. HEAD:  Braided brown hair with graying.  Normocephalic, atraumatic, face symmetric, no Cushingoid features. EYES:  Glasses.  Brown eyes.  Pupils equal round and reactive to light and  accomodation.  No conjunctivitis or scleral icterus. ENT:  Oropharynx clear without lesion.  Dentures.  Tongue normal. Mucous membranes moist.  RESPIRATORY:  Clear to auscultation without rales, wheezes or rhonchi. CARDIOVASCULAR:  Regular rate and rhythm without murmur, rub or gallop. ABDOMEN:  Soft, non-tender, with active bowel sounds, and no hepatosplenomegaly.  No masses. SKIN:  No rashes, ulcers or lesions. EXTREMITIES: No edema, skin discoloration or tenderness.  No palpable cords. LYMPH NODES: No palpable cervical, supraclavicular, axillary or inguinal adenopathy  NEUROLOGICAL: Unremarkable. PSYCH:  Appropriate.   No visits with results within 3 Day(s) from this visit.  Latest known visit with results is:  Hospital Outpatient Visit on 03/28/2016  Component Date Value Ref Range Status  . aPTT 03/28/2016 42* 24 - 36 seconds Final   Comment:        IF BASELINE aPTT IS ELEVATED, SUGGEST PATIENT RISK ASSESSMENT BE USED TO DETERMINE APPROPRIATE ANTICOAGULANT THERAPY.   . WBC 03/28/2016 7.3  3.6 - 11.0 K/uL Final  . RBC 03/28/2016 4.12  3.80 - 5.20 MIL/uL Final  . Hemoglobin 03/28/2016 12.5  12.0 - 16.0 g/dL Final  . HCT 03/28/2016 37.4  35.0 - 47.0 % Final  . MCV 03/28/2016 90.9  80.0 - 100.0 fL Final  . MCH 03/28/2016 30.3  26.0 - 34.0 pg Final  . MCHC 03/28/2016 33.3  32.0 - 36.0 g/dL Final  . RDW 03/28/2016 13.1  11.5 - 14.5 % Final  . Platelets 03/28/2016 324  150 - 440 K/uL Final  . Sodium 03/28/2016 139  135 - 145 mmol/L Final  . Potassium 03/28/2016 4.6  3.5 - 5.1 mmol/L Final  . Chloride 03/28/2016 107  101 - 111 mmol/L Final  . CO2 03/28/2016 27  22 - 32 mmol/L Final  . Glucose, Bld 03/28/2016 91  65 - 99 mg/dL Final  . BUN 03/28/2016 14  6 - 20 mg/dL Final  . Creatinine, Ser 03/28/2016 0.64  0.44 - 1.00 mg/dL Final  . Calcium 03/28/2016 9.2  8.9 - 10.3 mg/dL Final  . Total Protein 03/28/2016 7.1  6.5 - 8.1 g/dL Final  . Albumin 03/28/2016 4.0  3.5 - 5.0 g/dL Final   . AST 03/28/2016 18  15 - 41 U/L Final  . ALT 03/28/2016 17  14 - 54 U/L Final  . Alkaline Phosphatase 03/28/2016 61  38 - 126 U/L Final  . Total Bilirubin 03/28/2016 0.7  0.3 - 1.2 mg/dL Final  . GFR calc non Af Amer 03/28/2016 >60  >60 mL/min Final  . GFR calc Af Amer 03/28/2016 >60  >60 mL/min Final   Comment: (NOTE) The eGFR has been calculated using the CKD EPI equation. This calculation has not been validated in all clinical situations. eGFR's persistently <60 mL/min signify possible Chronic Kidney Disease.   . Anion gap 03/28/2016 5  5 - 15 Final  . Prothrombin Time 03/28/2016 12.2  11.4 - 15.2 seconds Final  . INR  03/28/2016 0.91   Final    Assessment:  Brandi Tomlinson is a 60 y.o. female with lupus x 8 years.  CT angiogram of the chest, abdominal and pelvis on 02/17/2016 revealed a 1.1 cm spiculated lesion in the left upper lobe increased from prior exam (08/30/2010). There were stable small pulmonary nodules bilaterally (2-3 mm).  She has a 15 pack year smoking history.  PET scan on 02/15/2016 revealed an irregular 1.0 cm apical LUL pulmonary nodule (SUV 0.7).  There was a 4 mm right middle lobe solid pulmonary nodule (stable since 08/02/2010) and two additional right lower lobe 3 mm pulmonary nodules.  PFTs revealed an FEV1 was 84% and her DLCO was 67%.  She has received the series of rabies injections after being bitten by a stray dog.  She has never had a colonoscopy.    She notes a history of 16 pound weight loss over an unclear period of time.  Her weight has been stable since 02/09/2016.  TSH was normal on 02/09/2016.  Symptomatically, she denies any new complaints.  Exam reveals no adenopathy or hepatosplenomegaly.  Plan: 1.  Discuss tumor board recommendations.  Discuss biopsy versus wedge resection.  Patient not interested in lobectomy.  Patient does not want lobe removed.  Discuss lesion concerning for malignancy, but unknown until biopsy or resection performed.   After some discussion, patient wishes to proceed with wedge resection scheduled for 04/17/2016. 2.  RTC on 04/28/2016 for MD assess after lung surgery (scheduled for 04/17/2016).  Addendum:  Request colonoscopy be scheduled.   Lequita Asal, MD  04/07/2016, 11:26 AM

## 2016-04-07 NOTE — Progress Notes (Signed)
Patient states she is scheduled for surgery for lobectomy on 04-17-16.

## 2016-04-09 ENCOUNTER — Encounter: Payer: Self-pay | Admitting: Hematology and Oncology

## 2016-04-10 ENCOUNTER — Other Ambulatory Visit: Payer: Self-pay | Admitting: *Deleted

## 2016-04-10 DIAGNOSIS — Z1211 Encounter for screening for malignant neoplasm of colon: Secondary | ICD-10-CM

## 2016-04-10 DIAGNOSIS — R760 Raised antibody titer: Secondary | ICD-10-CM

## 2016-04-10 DIAGNOSIS — R634 Abnormal weight loss: Secondary | ICD-10-CM

## 2016-04-14 ENCOUNTER — Ambulatory Visit
Admission: RE | Admit: 2016-04-14 | Discharge: 2016-04-14 | Disposition: A | Discharge status: Home / Self Care | Payer: BLUE CROSS/BLUE SHIELD | Source: Ambulatory Visit | Attending: Cardiothoracic Surgery | Admitting: Cardiothoracic Surgery

## 2016-04-14 ENCOUNTER — Other Ambulatory Visit: Payer: Self-pay | Admitting: Family Medicine

## 2016-04-14 ENCOUNTER — Encounter
Admission: RE | Admit: 2016-04-14 | Discharge: 2016-04-14 | Disposition: A | Discharge status: Home / Self Care | Payer: BLUE CROSS/BLUE SHIELD | Source: Ambulatory Visit | Attending: Cardiothoracic Surgery | Admitting: Cardiothoracic Surgery

## 2016-04-14 DIAGNOSIS — Z01818 Encounter for other preprocedural examination: Secondary | ICD-10-CM | POA: Insufficient documentation

## 2016-04-14 DIAGNOSIS — J449 Chronic obstructive pulmonary disease, unspecified: Secondary | ICD-10-CM | POA: Insufficient documentation

## 2016-04-14 DIAGNOSIS — C3490 Malignant neoplasm of unspecified part of unspecified bronchus or lung: Secondary | ICD-10-CM

## 2016-04-14 HISTORY — DX: Reserved for inherently not codable concepts without codable children: IMO0001

## 2016-04-14 HISTORY — DX: Malignant (primary) neoplasm, unspecified: C80.1

## 2016-04-14 LAB — SURGICAL PCR SCREEN
MRSA, PCR: NEGATIVE
Staphylococcus aureus: NEGATIVE

## 2016-04-14 NOTE — Patient Instructions (Signed)
  Your procedure is scheduled [id number] 18, 2017 (Monday) Report to Same Day Surgery 2nd floor [facility] To find out your arrival time please call [phone] between 1PM - 3PM on ARRIVAL TIME 6:00 AM Remember: Instructions that are not followed completely may result in serious medical risk, up to and including death, or upon the discretion of your surgeon and anesthesiologist your surgery may need to be rescheduled.    _x___ 1. Do not eat food or drink liquids after midnight. No gum chewing or hard candies.     _x___ 2. No Alcohol for 24 hours before or after surgery.   _x    __3. No Smoking for 24 prior to surgery.   ____  4. Bring all medications with you on the day of surgery if instructed.    __x__ 5. Notify your doctor if there is any change in your medical condition     (cold, fever, infections).     Do not wear jewelry, make-up, hairpins, clips or nail polish.  Do not wear lotions, powders, or perfumes. You may wear deodorant.  Do not shave 48 hours prior to surgery. Men may shave face and neck.  Do not bring valuables to the hospital.    [facility] is not responsible for any belongings or valuables.               Contacts, dentures or bridgework may not be worn into surgery.  Leave your suitcase in the car. After surgery it may be brought to your room.  For patients admitted to the hospital, discharge time is determined by your treatment team.   Patients discharged the day of surgery will not be allowed to drive home.    Please read over the following fact sheets that you were given:   [facility] Preparing for Surgery and or MRSA Information   ___ Take these medicines the morning of surgery with A SIP OF WATER:    1.   2.  3.  4.  5.  6.  ____ Fleet Enema (as directed)   ___ Use CHG Soap or sage wipes as directed on instruction sheet   ____ Use inhalers on the day of surgery and bring to hospital day of surgery  ____ Stop metformin 2 days prior to  surgery    ____ Take 1/2 of usual insulin dose the night before surgery and none on the morning of surgery.            _x_ Stop aspirin or coumadin, or plavix  (NO ASPIRIN)  _x__ Stop Anti-inflammatories such as Advil, Aleve, Ibuprofen, Motrin, Naproxen,          Naprosyn, Goodies powders or aspirin products. Ok to take Tylenol.   _x___ Stop supplements until after surgery.  (NO FISH OIL)  ____ Bring C-Pap to the hospital.

## 2016-04-17 ENCOUNTER — Inpatient Hospital Stay
Admission: RE | Admit: 2016-04-17 | Discharge: 2016-04-24 | DRG: 164 | Disposition: A | Discharge status: Home Health | Payer: BLUE CROSS/BLUE SHIELD | Source: Ambulatory Visit | Attending: Cardiothoracic Surgery | Admitting: Cardiothoracic Surgery

## 2016-04-17 ENCOUNTER — Encounter
Admission: RE | Disposition: A | Discharge status: Home Health | Payer: Self-pay | Source: Ambulatory Visit | Attending: Cardiothoracic Surgery

## 2016-04-17 ENCOUNTER — Inpatient Hospital Stay: Payer: BLUE CROSS/BLUE SHIELD

## 2016-04-17 ENCOUNTER — Inpatient Hospital Stay: Payer: BLUE CROSS/BLUE SHIELD | Admitting: Anesthesiology

## 2016-04-17 ENCOUNTER — Encounter: Payer: Self-pay | Admitting: *Deleted

## 2016-04-17 DIAGNOSIS — I2510 Atherosclerotic heart disease of native coronary artery without angina pectoris: Secondary | ICD-10-CM | POA: Diagnosis present

## 2016-04-17 DIAGNOSIS — Z4682 Encounter for fitting and adjustment of non-vascular catheter: Secondary | ICD-10-CM

## 2016-04-17 DIAGNOSIS — T85898A Other specified complication of other internal prosthetic devices, implants and grafts, initial encounter: Secondary | ICD-10-CM

## 2016-04-17 DIAGNOSIS — R918 Other nonspecific abnormal finding of lung field: Secondary | ICD-10-CM

## 2016-04-17 DIAGNOSIS — I340 Nonrheumatic mitral (valve) insufficiency: Secondary | ICD-10-CM | POA: Diagnosis present

## 2016-04-17 DIAGNOSIS — M329 Systemic lupus erythematosus, unspecified: Secondary | ICD-10-CM | POA: Diagnosis present

## 2016-04-17 DIAGNOSIS — Z9689 Presence of other specified functional implants: Secondary | ICD-10-CM

## 2016-04-17 DIAGNOSIS — J939 Pneumothorax, unspecified: Secondary | ICD-10-CM | POA: Diagnosis not present

## 2016-04-17 DIAGNOSIS — K567 Ileus, unspecified: Secondary | ICD-10-CM | POA: Diagnosis not present

## 2016-04-17 DIAGNOSIS — R911 Solitary pulmonary nodule: Secondary | ICD-10-CM | POA: Diagnosis present

## 2016-04-17 DIAGNOSIS — Z87891 Personal history of nicotine dependence: Secondary | ICD-10-CM | POA: Diagnosis not present

## 2016-04-17 DIAGNOSIS — Z9889 Other specified postprocedural states: Secondary | ICD-10-CM

## 2016-04-17 DIAGNOSIS — R109 Unspecified abdominal pain: Secondary | ICD-10-CM

## 2016-04-17 DIAGNOSIS — Z801 Family history of malignant neoplasm of trachea, bronchus and lung: Secondary | ICD-10-CM | POA: Diagnosis not present

## 2016-04-17 DIAGNOSIS — Z09 Encounter for follow-up examination after completed treatment for conditions other than malignant neoplasm: Secondary | ICD-10-CM

## 2016-04-17 DIAGNOSIS — J9382 Other air leak: Secondary | ICD-10-CM | POA: Diagnosis not present

## 2016-04-17 DIAGNOSIS — R0689 Other abnormalities of breathing: Secondary | ICD-10-CM

## 2016-04-17 HISTORY — PX: FLEXIBLE BRONCHOSCOPY: SHX5094

## 2016-04-17 HISTORY — PX: THORACOTOMY: SHX5074

## 2016-04-17 LAB — ABO/RH: ABO/RH(D): A POS

## 2016-04-17 LAB — GLUCOSE, CAPILLARY: Glucose-Capillary: 125 mg/dL — ABNORMAL HIGH (ref 65–99)

## 2016-04-17 SURGERY — BRONCHOSCOPY, FLEXIBLE
Anesthesia: Epidural | Wound class: Clean Contaminated

## 2016-04-17 MED ORDER — PROPOFOL 10 MG/ML IV BOLUS
INTRAVENOUS | Status: DC | PRN
  Administered 2016-04-17: 100 mg via INTRAVENOUS

## 2016-04-17 MED ORDER — ONDANSETRON HCL 4 MG/2ML IJ SOLN: 4.0000 mg | Freq: Once | INTRAMUSCULAR | Status: DC | PRN

## 2016-04-17 MED ORDER — ONDANSETRON HCL 4 MG/2ML IJ SOLN
4.0000 mg | Freq: Four times a day (QID) | INTRAMUSCULAR | Status: DC | PRN
  Filled 2016-04-17 (×2): qty 2

## 2016-04-17 MED ORDER — SODIUM CHLORIDE 0.9% FLUSH: 3.0000 mL | INTRAVENOUS | Status: DC | PRN

## 2016-04-17 MED ORDER — FENTANYL 2.5 MCG/ML W/ROPIVACAINE 0.2% IN NS 100 ML EPIDURAL INFUSION (ARMC-ANES)
EPIDURAL | Status: AC
  Administered 2016-04-17: 6 mL/h via EPIDURAL
  Filled 2016-04-17: qty 100

## 2016-04-17 MED ORDER — NALOXONE HCL 0.4 MG/ML IJ SOLN
0.4000 mg | INTRAMUSCULAR | Status: DC | PRN
  Filled 2016-04-17: qty 1

## 2016-04-17 MED ORDER — DIPHENHYDRAMINE HCL 25 MG PO CAPS: 25.0000 mg | ORAL_CAPSULE | ORAL | Status: DC | PRN

## 2016-04-17 MED ORDER — HYDROMORPHONE HCL 1 MG/ML IJ SOLN
INTRAMUSCULAR | Status: DC | PRN
  Administered 2016-04-17 (×2): 0.5 mg via INTRAVENOUS

## 2016-04-17 MED ORDER — ONDANSETRON HCL 4 MG/2ML IJ SOLN
INTRAMUSCULAR | Status: DC | PRN
Start: 2016-04-17 — End: 2016-04-17
  Administered 2016-04-17: 4 mg via INTRAVENOUS

## 2016-04-17 MED ORDER — SUGAMMADEX SODIUM 200 MG/2ML IV SOLN
INTRAVENOUS | Status: DC | PRN
  Administered 2016-04-17: 110 mg via INTRAVENOUS

## 2016-04-17 MED ORDER — MORPHINE SULFATE (PF) 2 MG/ML IV SOLN
1.0000 mg | INTRAVENOUS | Status: DC | PRN
  Administered 2016-04-17 – 2016-04-18 (×4): 2 mg via INTRAVENOUS
  Filled 2016-04-17 (×4): qty 1

## 2016-04-17 MED ORDER — SUCCINYLCHOLINE CHLORIDE 20 MG/ML IJ SOLN: INTRAMUSCULAR | Status: DC | PRN

## 2016-04-17 MED ORDER — FAMOTIDINE 20 MG PO TABS
ORAL_TABLET | ORAL | Status: AC
  Filled 2016-04-17: qty 1

## 2016-04-17 MED ORDER — DEXTROSE 5 % IV SOLN
1.5000 g | INTRAVENOUS | Status: AC
  Administered 2016-04-17: 1.5 g via INTRAVENOUS
  Filled 2016-04-17: qty 1.5

## 2016-04-17 MED ORDER — NALBUPHINE HCL 10 MG/ML IJ SOLN
5.0000 mg | Freq: Once | INTRAMUSCULAR | Status: DC | PRN
  Filled 2016-04-17: qty 0.5

## 2016-04-17 MED ORDER — DEXTROSE 5 % IV SOLN
1.5000 g | Freq: Two times a day (BID) | INTRAVENOUS | Status: AC
  Administered 2016-04-17 – 2016-04-18 (×2): 1.5 g via INTRAVENOUS
  Filled 2016-04-17 (×2): qty 1.5

## 2016-04-17 MED ORDER — SODIUM CHLORIDE 0.9 % IJ SOLN
INTRAMUSCULAR | Status: AC
  Filled 2016-04-17: qty 50

## 2016-04-17 MED ORDER — ALBUTEROL SULFATE (2.5 MG/3ML) 0.083% IN NEBU
2.5000 mg | INHALATION_SOLUTION | RESPIRATORY_TRACT | Status: DC
  Administered 2016-04-17 – 2016-04-18 (×3): 2.5 mg via RESPIRATORY_TRACT
  Filled 2016-04-17 (×3): qty 3

## 2016-04-17 MED ORDER — DEXTROSE-NACL 5-0.45 % IV SOLN
INTRAVENOUS | Status: DC
  Administered 2016-04-17 – 2016-04-19 (×4): via INTRAVENOUS

## 2016-04-17 MED ORDER — PHENYLEPHRINE HCL 10 MG/ML IJ SOLN
INTRAMUSCULAR | Status: DC | PRN
  Administered 2016-04-17 (×3): 100 ug via INTRAVENOUS
  Administered 2016-04-17: 50 ug via INTRAVENOUS
  Administered 2016-04-17: 100 ug via INTRAVENOUS
  Administered 2016-04-17: 50 ug via INTRAVENOUS

## 2016-04-17 MED ORDER — NALBUPHINE HCL 10 MG/ML IJ SOLN
5.0000 mg | INTRAMUSCULAR | Status: DC | PRN
  Filled 2016-04-17: qty 0.5

## 2016-04-17 MED ORDER — BISACODYL 5 MG PO TBEC
10.0000 mg | DELAYED_RELEASE_TABLET | Freq: Every day | ORAL | Status: DC
  Administered 2016-04-18 – 2016-04-24 (×7): 10 mg via ORAL
  Filled 2016-04-17 (×3): qty 2
  Filled 2016-04-17: qty 1
  Filled 2016-04-17 (×5): qty 2

## 2016-04-17 MED ORDER — MIDAZOLAM HCL 2 MG/2ML IJ SOLN: INTRAMUSCULAR | Status: DC | PRN

## 2016-04-17 MED ORDER — ROCURONIUM BROMIDE 100 MG/10ML IV SOLN
INTRAVENOUS | Status: DC | PRN
  Administered 2016-04-17: 10 mg via INTRAVENOUS
  Administered 2016-04-17: 50 mg via INTRAVENOUS
  Administered 2016-04-17 (×2): 10 mg via INTRAVENOUS

## 2016-04-17 MED ORDER — DEXAMETHASONE SODIUM PHOSPHATE 10 MG/ML IJ SOLN
INTRAMUSCULAR | Status: DC | PRN
  Administered 2016-04-17: 8 mg via INTRAVENOUS

## 2016-04-17 MED ORDER — FAMOTIDINE 20 MG PO TABS
20.0000 mg | ORAL_TABLET | Freq: Once | ORAL | Status: AC
  Administered 2016-04-17: 20 mg via ORAL

## 2016-04-17 MED ORDER — BUPIVACAINE LIPOSOME 1.3 % IJ SUSP
INTRAMUSCULAR | Status: AC
  Filled 2016-04-17: qty 20

## 2016-04-17 MED ORDER — BUPIVACAINE LIPOSOME 1.3 % IJ SUSP
INTRAMUSCULAR | Status: DC | PRN
  Administered 2016-04-17: 70 mL

## 2016-04-17 MED ORDER — GLYCOPYRROLATE 0.2 MG/ML IJ SOLN
INTRAMUSCULAR | Status: DC | PRN
  Administered 2016-04-17: 0.1 mg via INTRAVENOUS

## 2016-04-17 MED ORDER — DIPHENHYDRAMINE HCL 50 MG/ML IJ SOLN: 12.5000 mg | INTRAMUSCULAR | Status: DC | PRN

## 2016-04-17 MED ORDER — NALOXONE HCL 2 MG/2ML IJ SOSY
1.0000 ug/kg/h | PREFILLED_SYRINGE | INTRAMUSCULAR | Status: DC | PRN
  Filled 2016-04-17: qty 2

## 2016-04-17 MED ORDER — ACETAMINOPHEN 10 MG/ML IV SOLN
INTRAVENOUS | Status: DC | PRN
  Administered 2016-04-17: 1000 mg via INTRAVENOUS

## 2016-04-17 MED ORDER — ACETAMINOPHEN 10 MG/ML IV SOLN
INTRAVENOUS | Status: AC
  Filled 2016-04-17: qty 100

## 2016-04-17 MED ORDER — FENTANYL CITRATE (PF) 100 MCG/2ML IJ SOLN
25.0000 ug | INTRAMUSCULAR | Status: AC | PRN
  Administered 2016-04-17 (×6): 25 ug via INTRAVENOUS

## 2016-04-17 MED ORDER — FENTANYL 2.5 MCG/ML W/ROPIVACAINE 0.2% IN NS 100 ML EPIDURAL INFUSION (ARMC-ANES)
8.0000 mL/h | EPIDURAL | Status: DC
  Administered 2016-04-17 – 2016-04-18 (×2): 6 mL/h via EPIDURAL
  Administered 2016-04-18: 10 mL/h via EPIDURAL
  Administered 2016-04-19 – 2016-04-23 (×9): 8 mL/h via EPIDURAL
  Filled 2016-04-17 (×11): qty 100

## 2016-04-17 MED ORDER — LACTATED RINGERS IV SOLN
INTRAVENOUS | Status: DC | PRN
  Administered 2016-04-17: 08:00:00 via INTRAVENOUS

## 2016-04-17 MED ORDER — MEPERIDINE HCL 25 MG/ML IJ SOLN: 6.2500 mg | INTRAMUSCULAR | Status: DC | PRN

## 2016-04-17 MED ORDER — ONDANSETRON HCL 4 MG/2ML IJ SOLN: 4.0000 mg | Freq: Three times a day (TID) | INTRAMUSCULAR | Status: DC | PRN

## 2016-04-17 MED ORDER — MIDAZOLAM HCL 2 MG/2ML IJ SOLN
INTRAMUSCULAR | Status: DC | PRN
  Administered 2016-04-17 (×4): 1 mg via INTRAVENOUS

## 2016-04-17 MED ORDER — LACTATED RINGERS IV SOLN
INTRAVENOUS | Status: DC
  Administered 2016-04-17 (×2): via INTRAVENOUS

## 2016-04-17 MED ORDER — FENTANYL CITRATE (PF) 100 MCG/2ML IJ SOLN
INTRAMUSCULAR | Status: AC
  Administered 2016-04-17: 25 ug via INTRAVENOUS
  Filled 2016-04-17: qty 2

## 2016-04-17 MED ORDER — LIDOCAINE HCL (CARDIAC) 20 MG/ML IV SOLN
INTRAVENOUS | Status: DC | PRN
  Administered 2016-04-17: 50 mg via INTRAVENOUS

## 2016-04-17 MED ORDER — BUPIVACAINE HCL (PF) 0.25 % IJ SOLN
INTRAMUSCULAR | Status: DC | PRN
  Administered 2016-04-17 (×2): 3 mL via EPIDURAL

## 2016-04-17 MED ORDER — TRAMADOL HCL 50 MG PO TABS
50.0000 mg | ORAL_TABLET | Freq: Four times a day (QID) | ORAL | Status: DC | PRN
  Administered 2016-04-17: 100 mg via ORAL
  Filled 2016-04-17: qty 2

## 2016-04-17 MED ORDER — SODIUM CHLORIDE 0.9 % IV SOLN
INTRAVENOUS | Status: DC | PRN
  Administered 2016-04-17: 10 ug/min via INTRAVENOUS

## 2016-04-17 SURGICAL SUPPLY — 77 items
BENZOIN TINCTURE PRP APPL 2/3 (GAUZE/BANDAGES/DRESSINGS) ×3 IMPLANT
BLADE SURG 15 STRL LF DISP TIS (BLADE) ×2 IMPLANT
BLADE SURG 15 STRL SS (BLADE) ×1
BNDG COHESIVE 4X5 TAN STRL (GAUZE/BANDAGES/DRESSINGS) IMPLANT
BRONCHOSCOPE PED SLIM DISP (MISCELLANEOUS) ×3 IMPLANT
CANISTER SUCT 1200ML W/VALVE (MISCELLANEOUS) ×3 IMPLANT
CATH THOR STR 28F  SOFT WA (CATHETERS) ×1
CATH THOR STR 28F SOFT WA (CATHETERS) ×2 IMPLANT
CATH THORACIC RT ANG 28FR SOFT (CATHETERS) ×3 IMPLANT
CATH TRAY 16F METER LATEX (MISCELLANEOUS) ×3 IMPLANT
CATH URET ROBINSON 16FR STRL (CATHETERS) ×3 IMPLANT
CHLORAPREP W/TINT 26ML (MISCELLANEOUS) ×6 IMPLANT
CNTNR SPEC 2.5X3XGRAD LEK (MISCELLANEOUS) ×2
CONN REDUCER 3/8X3/8X3/8Y (CONNECTOR) ×3
CONNECTOR REDUCER 3/8X3/8X3/8Y (CONNECTOR) ×2 IMPLANT
CONT SPEC 4OZ STER OR WHT (MISCELLANEOUS) ×1
CONTAINER SPEC 2.5X3XGRAD LEK (MISCELLANEOUS) ×2 IMPLANT
CUTTER ECHEON FLEX ENDO 45 340 (ENDOMECHANICALS) ×3 IMPLANT
DRAIN CHEST DRY SUCT SGL (MISCELLANEOUS) ×3 IMPLANT
DRAPE C-SECTION (MISCELLANEOUS) ×3 IMPLANT
DRAPE MAG INST 16X20 L/F (DRAPES) ×3 IMPLANT
DRSG OPSITE POSTOP 4X10 (GAUZE/BANDAGES/DRESSINGS) ×3 IMPLANT
DRSG OPSITE POSTOP 4X6 (GAUZE/BANDAGES/DRESSINGS) IMPLANT
DRSG OPSITE POSTOP 4X8 (GAUZE/BANDAGES/DRESSINGS) IMPLANT
DRSG TELFA 3X8 NADH (GAUZE/BANDAGES/DRESSINGS) ×3 IMPLANT
ELECT BLADE 6.5 EXT (BLADE) ×6 IMPLANT
ELECT CAUTERY BLADE TIP 2.5 (TIP) ×3
ELECT REM PT RETURN 9FT ADLT (ELECTROSURGICAL) ×3
ELECTRODE CAUTERY BLDE TIP 2.5 (TIP) ×2 IMPLANT
ELECTRODE REM PT RTRN 9FT ADLT (ELECTROSURGICAL) ×2 IMPLANT
GAUZE SPONGE 4X4 12PLY STRL (GAUZE/BANDAGES/DRESSINGS) ×3 IMPLANT
GLOVE BIO SURGEON STRL SZ7 (GLOVE) ×12 IMPLANT
GLOVE INDICATOR 7.0 STRL GRN (GLOVE) ×6 IMPLANT
GLOVE INDICATOR 8.0 STRL GRN (GLOVE) ×3 IMPLANT
GLOVE SURG SYN 7.5  E (GLOVE) ×2
GLOVE SURG SYN 7.5 E (GLOVE) ×4 IMPLANT
GOWN STRL REUS W/ TWL LRG LVL3 (GOWN DISPOSABLE) ×10 IMPLANT
GOWN STRL REUS W/TWL LRG LVL3 (GOWN DISPOSABLE) ×5
KIT RM TURNOVER STRD PROC AR (KITS) ×3 IMPLANT
LABEL OR SOLS (LABEL) ×3 IMPLANT
LOOP RED MAXI  1X406MM (MISCELLANEOUS) ×1
LOOP VESSEL MAXI 1X406 RED (MISCELLANEOUS) ×2 IMPLANT
MARKER SKIN DUAL TIP RULER LAB (MISCELLANEOUS) ×3 IMPLANT
PACK BASIN MAJOR ARMC (MISCELLANEOUS) ×3 IMPLANT
RELOAD GOLD ECHELON 45 (STAPLE) ×18 IMPLANT
RELOAD STAPLER LINE PROX 30 GR (STAPLE) ×2 IMPLANT
SEALANT SURG COSEAL 4ML (VASCULAR PRODUCTS) ×3 IMPLANT
SOL ANTI-FOG 6CC FOG-OUT (MISCELLANEOUS) ×2 IMPLANT
SOL FOG-OUT ANTI-FOG 6CC (MISCELLANEOUS) ×1
SPONGE KITTNER 5P (MISCELLANEOUS) ×6 IMPLANT
STAPLE RELOAD 2.5MM WHITE (STAPLE) ×24 IMPLANT
STAPLER RELOAD LINE PROX 30 GR (STAPLE) ×3
STAPLER SKIN PROX 35W (STAPLE) ×3 IMPLANT
STAPLER VASCULAR ECHELON 35 (CUTTER) ×3 IMPLANT
STRIP CLOSURE SKIN 1/2X4 (GAUZE/BANDAGES/DRESSINGS) ×3 IMPLANT
SUT CHROMIC 3 0 SH 27 (SUTURE) ×3 IMPLANT
SUT MNCRL AB 3-0 PS2 27 (SUTURE) IMPLANT
SUT PROLENE 5 0 RB 1 DA (SUTURE) ×3 IMPLANT
SUT SILK 0 (SUTURE) ×1
SUT SILK 0 30XBRD TIE 6 (SUTURE) ×2 IMPLANT
SUT SILK 1 SH (SUTURE) ×18 IMPLANT
SUT SILK 2 0 (SUTURE) ×1
SUT SILK 2-0 30XBRD TIE 12 (SUTURE) ×2 IMPLANT
SUT VIC AB 0 CT1 36 (SUTURE) ×6 IMPLANT
SUT VIC AB 2-0 CT1 (SUTURE) ×6 IMPLANT
SUT VIC AB 2-0 CT1 27 (SUTURE) ×2
SUT VIC AB 2-0 CT1 TAPERPNT 27 (SUTURE) ×4 IMPLANT
SUT VICRYL 2 TP 1 (SUTURE) ×9 IMPLANT
SYR 10ML SLIP (SYRINGE) ×3 IMPLANT
SYR BULB IRRIG 60ML STRL (SYRINGE) ×3 IMPLANT
TAPE ADH 3 LX (MISCELLANEOUS) ×3 IMPLANT
TAPE TRANSPORE STRL 2 31045 (GAUZE/BANDAGES/DRESSINGS) IMPLANT
TROCAR FLEXIPATH 20X80 (ENDOMECHANICALS) IMPLANT
TROCAR FLEXIPATH THORACIC 15MM (ENDOMECHANICALS) ×3 IMPLANT
TUBING CONNECTING 10 (TUBING) ×3 IMPLANT
WATER STERILE IRR 1000ML POUR (IV SOLUTION) ×3 IMPLANT
YANKAUER SUCT BULB TIP FLEX NO (MISCELLANEOUS) ×6 IMPLANT

## 2016-04-17 NOTE — Progress Notes (Signed)
Called lab for bloodwork.

## 2016-04-17 NOTE — Anesthesia Procedure Notes (Signed)
Procedure Name: Intubation Date/Time: 04/17/2016 8:03 AM Performed by: Darlyne Russian Pre-anesthesia Checklist: Patient identified, Emergency Drugs available, Suction available and Patient being monitored Patient Re-evaluated:Patient Re-evaluated prior to inductionOxygen Delivery Method: Circle system utilized Preoxygenation: Pre-oxygenation with 100% oxygen Intubation Type: IV induction Ventilation: Mask ventilation without difficulty Laryngoscope Size: Mac and 3 Grade View: Grade II Endobronchial tube: Left, EBT position confirmed by fiberoptic bronchoscope and Double lumen EBT and 35 Fr Number of attempts: 1 (37 tube placed and exchanged to 35 .) Airway Equipment and Method: Stylet and Fiberoptic brochoscope (placement checked by surgeon immediately after placement.) Placement Confirmation: ETT inserted through vocal cords under direct vision,  positive ETCO2 and breath sounds checked- equal and bilateral Secured at: 27 cm Tube secured with: Tape Dental Injury: Teeth and Oropharynx as per pre-operative assessment

## 2016-04-17 NOTE — Interval H&P Note (Signed)
History and Physical Interval Note:  04/17/2016 7:18 AM  Sheila Suarez  has presented today for surgery, with the diagnosis of LUNG MASS  The various methods of treatment have been discussed with the patient and family. After consideration of risks, benefits and other options for treatment, the patient has consented to  Procedure(s): FLEXIBLE BRONCHOSCOPY (N/A) THORACOTOMY LEFT LUNG RESECTION (Left) as a surgical intervention .  The patient's history has been reviewed, patient examined, no change in status, stable for surgery.  I have reviewed the patient's chart and labs.  Questions were answered to the patient's satisfaction.     [name]

## 2016-04-17 NOTE — Transfer of Care (Signed)
Immediate Anesthesia Transfer of Care Note  Patient: Sheila Suarez  Procedure(s) Performed: Procedure(s): FLEXIBLE BRONCHOSCOPY (N/A) THORACOTOMY LEFT UPPER LOBE LUNG RESECTION (Left)  Patient Location: PACU  Anesthesia Type:General and Epidural  Level of Consciousness: awake and oriented  Airway & Oxygen Therapy: Patient Spontanous Breathing and Patient connected to face mask oxygen  Post-op Assessment: Report given to RN and Post -op Vital signs reviewed and stable  Post vital signs: Reviewed and stable  Last Vitals:  Vitals:   04/17/16 0620 04/17/16 1233  BP: 127/82 (!) 146/75  Pulse: (!) 55 70  Resp: 16 20  Temp: 36.1 C 36.3 C    Last Pain:  Vitals:   04/17/16 0620  TempSrc: Tympanic         Complications: No apparent anesthesia complications

## 2016-04-17 NOTE — H&P (View-Only) (Signed)
Patient ID: Sheila Suarez, female   DOB: Mar 25, 1956, 60 y.o.   MRN: [id number]  Chief Complaint  Patient presents with  . Follow-up    Referred By Dr. Nolon Stalls Reason for Referral left upper lobe mass  HPI Location, Quality, Duration, Severity, Timing, Context, Modifying Factors, Associated Signs and Symptoms.  Sheila Suarez is a 60 y.o. female.  I have been following her for the last several weeks regarding the workup of her left upper lobe mass. She states that she did quit smoking 2 weeks ago and now feels slightly better as she continues to do more activities. She does not complain of any significant chest pain and recently saw Dr. Nehemiah Massed where he gave her cardiac clearance on the basis of an echocardiogram revealing normal left ventricular function and some moderate mitral regurgitation. She saw Dr. Nolon Stalls who is recommended that she undergo surgery for her enlarging left upper lobe mass. I again reviewed with her the CT scan findings. The CT scan has shown the left upper lobe nodule to increase from several millimeters to 10 mm over the last 5 years. I discussed with her the options. These would include percutaneous biopsy, endobronchial biopsy, left thoracotomy and wedge resection and finally left thoracotomy and left upper lobectomy. The patient does not wish to pursue any form of biopsy. She would like to go straight to surgery. She understands the indications and risks of wedge resection versus lobectomy. She understands the oncologic principles behind a lobectomy as compared to a wedge resection. Because of her shortness of breath she would like to attempt a wedge resection if that is technically feasible. I explained her that that may be an inferior cancer operation she would like pursue that if possible. I did review with her all the options and risks. Risks of bleeding, infection, air leak and death were all reviewed. She was encouraged and congratulated on her  tobacco cessation.   Past Medical History:  Diagnosis Date  . Coronary artery disease   . Lupus [facility])     Past Surgical History:  Procedure Laterality Date  . CYST EXCISION Left   . TUBAL LIGATION Bilateral    37 years ago    Family History  Problem Relation Age of Onset  . Pancreatic cancer Sister   . Lung cancer Sister   . Lung cancer Brother   . Stomach cancer Paternal Grandmother   . Heart disease Father   . Cancer Daughter 5    cervical  . Cancer Maternal Aunt     brain  . Cancer Maternal Uncle     prostate  . Cancer Maternal Aunt     bone    Social History Social History  Substance Use Topics  . Smoking status: Current Every Day Smoker    Packs/day: 0.25    Years: 35.00    Types: Cigarettes  . Smokeless tobacco: Never Used  . Alcohol use 0.6 oz/week    1 Cans of beer per week     Comment: Rarely    No Known Allergies  No current outpatient prescriptions on file.   No current facility-administered medications for this visit.       Review of Systems A complete review of systems was asked and was negative except for the following positive findings shortness of breath with exertion  Blood pressure 129/76, pulse (!) 49, temperature 97.9 F (36.6 C), temperature source Oral, weight 117 lb (53.1 kg), SpO2 100 %.  Physical Exam CONSTITUTIONAL:  Pleasant, well-developed, well-nourished,  and in no acute distress. EYES: Pupils equal and reactive to light, Sclera non-icteric EARS, NOSE, MOUTH AND THROAT:  The oropharynx was clear.  Dentition Shows upper and lower dentures.  Oral mucosa pink and moist. LYMPH NODES:  Lymph nodes in the neck and axillae were normal RESPIRATORY:  Lungs were clear.  Normal respiratory effort without pathologic use of accessory muscles of respiration CARDIOVASCULAR: Heart was regular without murmurs.  There were no carotid bruits. GI: The abdomen was soft, nontender, and nondistended. There were no palpable masses. There was  no hepatosplenomegaly. There were normal bowel sounds in all quadrants. GU:  Rectal deferred.   MUSCULOSKELETAL:  Normal muscle strength and tone.  No clubbing or cyanosis.   SKIN:  There were no pathologic skin lesions.  There were no nodules on palpation. NEUROLOGIC:  Sensation is normal.  Cranial nerves are grossly intact. PSYCH:  Oriented to person, place and time.  Mood and affect are normal.  Data Reviewed CT scans and PET scans  I have personally reviewed the patient's imaging, laboratory findings and medical records.    Assessment    Slowly enlarging left upper lobe mass CT scans would suggest that this is a malignancy but PET scanning is negative.    Plan    We will evaluate the patient for left thoracotomy and wedge resection possible lobectomy. She will have her preop performed. All of her questions were answered.       [name], MD 03/28/2016, 9:10 AM

## 2016-04-17 NOTE — Anesthesia Preprocedure Evaluation (Signed)
Anesthesia Evaluation  Patient identified by MRN, date of birth, ID band Patient awake    Reviewed: Allergy & Precautions, H&P , NPO status , Patient's Chart, lab work & pertinent test results, reviewed documented beta blocker date and time   History of Anesthesia Complications Negative for: history of anesthetic complications  Airway Mallampati: III  TM Distance: >3 FB Neck ROM: full    Dental no notable dental hx. (+) Edentulous Upper, Edentulous Lower, Upper Dentures, Lower Dentures   Pulmonary shortness of breath and with exertion, neg sleep apnea, neg COPD, neg recent URI, former smoker,    Pulmonary exam normal breath sounds clear to auscultation       Cardiovascular Exercise Tolerance: Good (-) hypertension(-) angina+ CAD  (-) Past MI, (-) Cardiac Stents and (-) CABG Normal cardiovascular exam(-) dysrhythmias (-) Valvular Problems/Murmurs Rhythm:regular Rate:Normal     Neuro/Psych negative neurological ROS  negative psych ROS   GI/Hepatic negative GI ROS, Neg liver ROS,   Endo/Other  negative endocrine ROS  Renal/GU negative Renal ROS  negative genitourinary   Musculoskeletal   Abdominal   Peds  Hematology negative hematology ROS (+)   Anesthesia Other Findings Past Medical History: No date: Cancer ([place])     Comment: Lung No date: Coronary artery disease No date: Lupus ([place]) No date: Shortness of breath dyspnea   Reproductive/Obstetrics negative OB ROS                             Anesthesia Physical Anesthesia Plan  ASA: II  Anesthesia Plan: General and Epidural   Post-op Pain Management:    Induction:   Airway Management Planned:   Additional Equipment:   Intra-op Plan:   Post-operative Plan:   Informed Consent: I have reviewed the patients History and Physical, chart, labs and discussed the procedure including the risks, benefits and alternatives for the  proposed anesthesia with the patient or authorized representative who has indicated his/her understanding and acceptance.   Dental Advisory Given  Plan Discussed with: Anesthesiologist, CRNA and Surgeon  Anesthesia Plan Comments:         Anesthesia Quick Evaluation

## 2016-04-17 NOTE — Progress Notes (Signed)
Anterior LUL breath sounds w/coarse rubbing, a change from prior shift.  Pt reports a feeling of "fluid" anterior left chest.  Dr Genevive Bi notified.  Orders received for STAT PCXR and to call results to [facility].

## 2016-04-17 NOTE — Anesthesia Procedure Notes (Signed)
Epidural Patient location during procedure: OR Start time: 04/17/2016 7:40 AM End time: 04/17/2016 7:50 AM  Staffing Anesthesiologist: Martha Clan Performed: anesthesiologist   Preanesthetic Checklist Completed: patient identified, site marked, surgical consent, pre-op evaluation, timeout performed, IV checked, risks and benefits discussed and monitors and equipment checked  Epidural Patient position: sitting Prep: ChloraPrep Patient monitoring: heart rate, continuous pulse ox and blood pressure Approach: left paramedian Location: thoracic (1-12) Injection technique: LOR saline  Needle:  Needle type: Tuohy  Needle gauge: 17 G Needle length: 9 cm and 9 Needle insertion depth: 5 cm Catheter type: closed end flexible Catheter size: 19 Gauge Catheter at skin depth: 10 cm Test dose: negative and 1.5% lidocaine with Epi 1:200 K  Assessment Events: blood not aspirated, injection not painful, no injection resistance, negative IV test and no paresthesia  Additional Notes   Patient tolerated the insertion well without complications.Reason for block:post-op pain management

## 2016-04-17 NOTE — Progress Notes (Signed)
Dr Adonis Huguenin to unit.  Updated on pt status and small CT air leak.  MD Viewed PCXR film.  Then to room to see pt.  Explained to pt she's most likely feeling the chest tube now that the local anesthetic is wearing off.  Pt reassured and with no new complaints at this time. Will cont to monitor.

## 2016-04-17 NOTE — Op Note (Signed)
04/17/2016  12:37 PM  PATIENT:  Sheila Suarez  60 y.o. female  PRE-OPERATIVE DIAGNOSIS:  Left upper lobe mass  POST-OPERATIVE DIAGNOSIS:  Left upper lobe mass  PROCEDURE:  Preoperative bronchoscopy to assess endobronchial anatomy; left thoracotomy with left upper lobectomy  SURGEON:  Surgeon(s) and Role:    * [name], MD - Primary    * Dia Crawford III, MD - Assisting  ASSISTANTS: Jen Mow PA-S  ANESTHESIA: Gen.  INDICATIONS FOR PROCEDURE this is a 60 year old African-American female with an enlarging left upper lobe mass. She was seen by oncology and thoracic surgeon was apprised of the indications risks of left thoracotomy and left upper lobectomy for her enlarging left upper lobe mass.  DICTATION: Patient is brought to the operative suite and placed in the supine position general endotracheal anesthesia was given through double lumen tube. Preoperative bronchoscopy was carried out. There is no evidence of tumor within the major airways. The tube was positioned appropriately in the left mainstem bronchus. Patient was then turned for left thoracotomy. All pressure points were carefully padded. The patient was prepped and draped in usual sterile fashion.  The posterior lateral fifth interspace thoracotomy was performed. The latissimus muscle was divided but this radius was spared. The chest was entered. There were some adhesions to the apex of the lung and these were taken down using the thoracoscope and electrocautery. Once this was completed within 3 to the inferior pulmonary ligament. Palpation of the lungs revealed only the abnormality present along the visceral surface where the lung was attached to the chest wall with adhesions. This area had some firmness to there is no obvious mass present. There was no discrete nodule identified. Using multiple firings of the endoscopic stapler the apex of the lung was resected where the tumor was felt to be on the basis of the  preoperative CT scan. The frozen section of this area did not reveal any evidence of tumor and there was only some fibrosis and some lymphoid aggregates.  At this point I decided to proceed on with a left upper lobectomy as I could not be call for a ride excised the area in question. We began by incising the mediastinal pleura up over the pulmonary artery over the superior pulmonary vein and posteriorly. The branches of the superior pulmonary vein were dissected. The fissure was opened and the pulmonary artery was identified in the depths of the fissure. Posteriorly the fissure was completed with electrocautery. Anteriorly the fissure was completed with firings of the endoscopic stapler. We identified the branches of the upper lobe coming from the pulmonary artery and these were taken with vascular stapler. We then identified the branches of the superior pulmonary vein. These were 3 in number and they were taken with the vascular stapler as well. The fissure was then completed by dissecting just inferior to the bronchus and passes right angle clamp inferior to the lingular bronchus through the fissure above the inferior pulmonary vein. The fissure was then secured with a endoscopic stapler. Before this was fired the middle and lower lobes ventilated nicely.  The upper lobe bronchus consisted of the lingular segment and the remaining portions of the upper lobe. These were secured with a TX 30 stapler. The lower lobes ventilated and a ventilator quite nicely. The stapler was fired and the lung was transected.  There were some enlarged lymph nodes around the root of the long and these were sent separately. The bronchial stump was leak checked at 30 cm water  pressure and there was no air leak. There was some air leak from the cut surfaces of the fissure and 4 cc of ProCel was used.  Hemostasis was complete. The lower lobes ventilated nicely and 2 chest tubes were inserted in standard fashion. The chest was then  closed. #2 Vicryl. Across sutures were used to approximate the ribs. The serratus was attached to its fascia using 2-0 Vicryl. The latissimus was approximated with #2 Vicryl and the subcutaneous tissues with 2-0 Vicryl. Skin was closed with staples. Our thoracoscopy port site was closed with Vicryl and nylon.  The patient was then rolled in the supine position. She was extubated and taken to recovery room in stable condition. All sponge needle and instrument counts were correct as reported to me at the end of the case.  [name], MD

## 2016-04-18 ENCOUNTER — Encounter: Payer: Self-pay | Admitting: Registered Nurse

## 2016-04-18 ENCOUNTER — Ambulatory Visit (HOSPITAL_COMMUNITY): Payer: BLUE CROSS/BLUE SHIELD

## 2016-04-18 LAB — CBC
HEMATOCRIT: 33.9 % — AB (ref 35.0–47.0)
Hemoglobin: 11 g/dL — ABNORMAL LOW (ref 12.0–16.0)
MCH: 29.5 pg (ref 26.0–34.0)
MCHC: 32.5 g/dL (ref 32.0–36.0)
MCV: 90.8 fL (ref 80.0–100.0)
PLATELETS: 302 10*3/uL (ref 150–440)
RBC: 3.74 MIL/uL — AB (ref 3.80–5.20)
RDW: 12.4 % (ref 11.5–14.5)
WBC: 10.9 10*3/uL (ref 3.6–11.0)

## 2016-04-18 LAB — COMPREHENSIVE METABOLIC PANEL
ALT: 16 U/L (ref 14–54)
AST: 22 U/L (ref 15–41)
Albumin: 3.2 g/dL — ABNORMAL LOW (ref 3.5–5.0)
Alkaline Phosphatase: 49 U/L (ref 38–126)
Anion gap: 5 (ref 5–15)
BUN: 8 mg/dL (ref 6–20)
CO2: 26 mmol/L (ref 22–32)
Calcium: 8.6 mg/dL — ABNORMAL LOW (ref 8.9–10.3)
Chloride: 106 mmol/L (ref 101–111)
Creatinine, Ser: 0.63 mg/dL (ref 0.44–1.00)
GFR calc Af Amer: >60 mL/min (ref >60)
GFR calc non Af Amer: >60 mL/min (ref >60)
Glucose, Bld: 121 mg/dL — ABNORMAL HIGH (ref 65–99)
Potassium: 3.9 mmol/L (ref 3.5–5.1)
Sodium: 137 mmol/L (ref 135–145)
Total Bilirubin: 0.4 mg/dL (ref 0.3–1.2)
Total Protein: 6.1 g/dL — ABNORMAL LOW (ref 6.5–8.1)

## 2016-04-18 MED ORDER — BISACODYL 10 MG RE SUPP
10.0000 mg | Freq: Once | RECTAL | Status: AC
  Administered 2016-04-18: 10 mg via RECTAL
  Filled 2016-04-18: qty 1

## 2016-04-18 MED ORDER — KETOROLAC TROMETHAMINE 30 MG/ML IJ SOLN
30.0000 mg | Freq: Four times a day (QID) | INTRAMUSCULAR | Status: AC
  Administered 2016-04-18 – 2016-04-22 (×19): 30 mg via INTRAVENOUS
  Filled 2016-04-18 (×19): qty 1

## 2016-04-18 MED ORDER — HYDROMORPHONE HCL 1 MG/ML IJ SOLN
0.2500 mg | INTRAMUSCULAR | Status: DC | PRN
  Administered 2016-04-19 – 2016-04-23 (×4): 0.25 mg via INTRAVENOUS
  Filled 2016-04-18 (×4): qty 1

## 2016-04-18 MED ORDER — ACETAMINOPHEN 325 MG PO TABS
650.0000 mg | ORAL_TABLET | Freq: Four times a day (QID) | ORAL | Status: DC
  Administered 2016-04-18 – 2016-04-19 (×5): 650 mg via ORAL
  Filled 2016-04-18 (×5): qty 2

## 2016-04-18 MED ORDER — LIDOCAINE 5 % EX PTCH
1.0000 | MEDICATED_PATCH | CUTANEOUS | Status: DC
  Administered 2016-04-18 – 2016-04-24 (×7): 1 via TRANSDERMAL
  Filled 2016-04-18 (×7): qty 1

## 2016-04-18 MED ORDER — ALBUTEROL SULFATE (2.5 MG/3ML) 0.083% IN NEBU
2.5000 mg | INHALATION_SOLUTION | Freq: Three times a day (TID) | RESPIRATORY_TRACT | Status: DC
  Administered 2016-04-18 – 2016-04-19 (×3): 2.5 mg via RESPIRATORY_TRACT
  Filled 2016-04-18 (×3): qty 3

## 2016-04-18 NOTE — Progress Notes (Signed)
Pt transferred to [facility]. Report called to receiving nurse. Pt in stable condition. No complications. All belongings with pt . Family at bedside.

## 2016-04-18 NOTE — Plan of Care (Signed)
Problem: Pain Managment: Goal: General experience of comfort will improve Outcome: Progressing Pt cont w/epidural infusing for pain control  This providing relief from surgical incision pain but not for L arm pain from surgical positioning.  Pt educated on prn po pain medication, ultram and pt wishes to try this.  Administered '100mg'$  Ultram with very good relief to pt from the pain in her left arm.  Cont to monitor and administer as needed w/in ordered parameters.

## 2016-04-18 NOTE — Anesthesia Postprocedure Evaluation (Signed)
Anesthesia Post Note  Patient: Sheila Suarez  Procedure(s) Performed: Procedure(s) (LRB): FLEXIBLE BRONCHOSCOPY (N/A) THORACOTOMY LEFT UPPER LOBE LUNG RESECTION (Left)  Patient location during evaluation: ICU Anesthesia Type: General Level of consciousness: awake and alert Pain management: pain level controlled Vital Signs Assessment: post-procedure vital signs reviewed and stable Respiratory status: spontaneous breathing, nonlabored ventilation, respiratory function stable and patient connected to nasal cannula oxygen Cardiovascular status: blood pressure returned to baseline and stable Postop Assessment: no signs of nausea or vomiting Anesthetic complications: no    Last Vitals:  Vitals:   04/18/16 0808 04/18/16 0900  BP:  125/66  Pulse:  76  Resp: 16 17  Temp:      Last Pain:  Vitals:   04/18/16 0808  TempSrc:   PainSc: 8                  [name]

## 2016-04-18 NOTE — Progress Notes (Signed)
  Patient ID: Sheila Suarez, female   DOB: 07-05-1956, 60 y.o.   MRN: [id number]  HISTORY: Didn't sleep well - ain and noise.  Hungry this morning.  Pain around chest tube sites.  Seen by anesthesia - changes made to epidural.  Toradol added.   Vitals:   04/18/16 0545 04/18/16 0600  BP:  (!) 110/55  Pulse:  64  Resp: 20 12  Temp:       EXAM:    Resp: Lungs are clear bilaterally but decreased on the left.  No respiratory distress, normal effort. Heart:  Regular without murmurs Neurological: Alert and oriented to person, place, and time. Coordination normal.  Skin: Skin is warm and dry. No rash noted. No diaphoretic. No erythema. No pallor.  Psychiatric: Normal mood and affect. Normal behavior. Judgment and thought content normal.    ASSESSMENT: POD #1.  Small air leak with cough.     PLAN:   Transfer to floor  Increase diet Physical Therapy    [name], MD

## 2016-04-18 NOTE — Evaluation (Signed)
Physical Therapy Evaluation Patient Details Name: Sheila Suarez MRN: [id number] DOB: 1955/11/24 Today's Date: 04/18/2016   History of Present Illness  Pt is admitted for L thoracotomy and upper lobectomy to remove L lung mass on 04/17/16. Pt now has L chest tube as well as epidural in place.  Clinical Impression  Pt is a pleasant 60 year old female who was admitted for L thoracotomy and upper lobectomy to remove L lung mass. Pt performs bed mobility, transfers, and ambulation with cga and rw. O2 levels WNL on room air. Pt demonstrates deficits with strength/balance/edurance/pain. Would benefit from skilled PT to address above deficits and promote optimal return to PLOF. Recommend transition to [place] upon discharge from acute hospitalization.       Follow Up Recommendations Home health PT;Supervision for mobility/OOB    Equipment Recommendations  Rolling walker with 5" wheels    Recommendations for Other Services       Precautions / Restrictions Precautions Precautions: Fall Restrictions Weight Bearing Restrictions: No      Mobility  Bed Mobility Overal bed mobility: Needs Assistance Bed Mobility: Supine to Sit     Supine to sit: Min guard     General bed mobility comments: safe technique with cues for sequencing. Once seated at EOB, pt able to sit with supervision  Transfers Overall transfer level: Needs assistance Equipment used: Rolling walker (2 wheeled) Transfers: Sit to/from Stand Sit to Stand: Min guard         General transfer comment: Pt cued to push from seated surface. Once standing, pt able to stand with upright posture. +2 assist required for handling of equipment  Ambulation/Gait Ambulation/Gait assistance: Min guard Ambulation Distance (Feet): 75 Feet Assistive device: Rolling walker (2 wheeled)       General Gait Details: ambulated using rw and reciprocal gait pattern. Pt becomes fatigued with ambulation and requests to turn back towards room.  Pt with O2 sats WNL during ambulation while on room air.  Stairs            Wheelchair Mobility    Modified Rankin (Stroke Patients Only)       Balance Overall balance assessment: Needs assistance Sitting-balance support: Feet supported Sitting balance-Leahy Scale: Normal     Standing balance support: Bilateral upper extremity supported Standing balance-Leahy Scale: Good                               Pertinent Vitals/Pain Pain Assessment: Faces Faces Pain Scale: Hurts even more Pain Location: L chest Pain Descriptors / Indicators: Operative site guarding Pain Intervention(s): Limited activity within patient's tolerance;Premedicated before session    Home Living Family/patient expects to be discharged to:: Private residence Living Arrangements: Children Available Help at Discharge: Family;Available PRN/intermittently Type of Home: House Home Access: Level entry     Home Layout: One level Home Equipment: None      Prior Function Level of Independence: Independent               Hand Dominance        Extremity/Trunk Assessment   Upper Extremity Assessment: Overall WFL for tasks assessed           Lower Extremity Assessment: Generalized weakness (B LE grossly 4/5)         Communication   Communication: No difficulties  Cognition Arousal/Alertness: Awake/alert Behavior During Therapy: WFL for tasks assessed/performed Overall Cognitive Status: Within Functional Limits for tasks assessed  General Comments      Exercises     Assessment/Plan    PT Assessment Patient needs continued PT services  PT Problem List Decreased strength;Decreased activity tolerance;Decreased balance;Decreased mobility          PT Treatment Interventions DME instruction;Gait training;Therapeutic exercise;Balance training    PT Goals (Current goals can be found in the Care Plan section)  Acute Rehab PT Goals Patient  Stated Goal: to get stronger PT Goal Formulation: With patient Time For Goal Achievement: 05/02/16 Potential to Achieve Goals: Good    Frequency Min 2X/week   Barriers to discharge        Co-evaluation               End of Session   Activity Tolerance: Patient limited by fatigue Patient left: in bed;with bed alarm set;with nursing/sitter in room;with SCD's reapplied Nurse Communication: Mobility status         Time: 1514-1530 PT Time Calculation (min) (ACUTE ONLY): 16 min   Charges:   PT Evaluation $PT Eval Moderate Complexity: 1 Procedure     PT G Codes:        Sheila Suarez 2016-05-03, 5:19 PM Greggory Stallion, PT, DPT [phone]

## 2016-04-18 NOTE — Progress Notes (Signed)
Epidural running @ 74m/hr.  Pt c/o pain L chest down to incision line and chest tube.  PRN morphine '2mg'$  IV given per prn order w/o relief.  Anesthesia service notified.  Orders received to increase epidural to 853mhr.

## 2016-04-18 NOTE — Anesthesia Post-op Follow-up Note (Signed)
  Anesthesia Pain Follow-up Note  Patient: Sheila Suarez  Day #: 1  Date of Follow-up: 04/18/2016 Time: 10:37 AM  Last Vitals:  Vitals:   04/18/16 0808 04/18/16 0900  BP:  125/66  Pulse:  76  Resp: 16 17  Temp:      Level of Consciousness: alert  Pain: 8 /10   Side Effects:None  Catheter Site Exam:clean, dry, no drainage  Epidural / Intrathecal    Start     Dose/Rate Route Frequency Ordered Stop   04/17/16 1245  fentaNYL 2.5 mcg/ml w/ropivacaine 0.2% (preservative free) in normal saline 100 mL EPIDURAL Infusion in 150 ml Intravia Bag     10 mL/hr 10 mL/hr  Epidural Continuous 04/17/16 1238         Plan: Continue current therapy and Modify therapy to improve pain control. Patient reports pain 8/10 scale. Fentanyl/ropivivaine infusing at 8 mls/hr. Grimace noted on patient's face. RN reports PO pain meds given with only temporary relief. Dr. Randa Lynn made aware of patient's concerns. Infusion increased to 10 mls/hr. Pain medications changed per Dr. Randa Lynn. Will reevaluate the patient tomorrow  [name]

## 2016-04-19 ENCOUNTER — Other Ambulatory Visit: Payer: Self-pay | Admitting: *Deleted

## 2016-04-19 ENCOUNTER — Inpatient Hospital Stay: Payer: BLUE CROSS/BLUE SHIELD

## 2016-04-19 DIAGNOSIS — R634 Abnormal weight loss: Secondary | ICD-10-CM

## 2016-04-19 DIAGNOSIS — R911 Solitary pulmonary nodule: Secondary | ICD-10-CM

## 2016-04-19 LAB — CBC
HCT: 31.3 % — ABNORMAL LOW (ref 35.0–47.0)
Hemoglobin: 10.6 g/dL — ABNORMAL LOW (ref 12.0–16.0)
MCH: 30.6 pg (ref 26.0–34.0)
MCHC: 33.9 g/dL (ref 32.0–36.0)
MCV: 90.2 fL (ref 80.0–100.0)
PLATELETS: 274 10*3/uL (ref 150–440)
RBC: 3.47 MIL/uL — AB (ref 3.80–5.20)
RDW: 12.4 % (ref 11.5–14.5)
WBC: 10.3 10*3/uL (ref 3.6–11.0)

## 2016-04-19 LAB — TYPE AND SCREEN
ABO/RH(D): A POS
Antibody Screen: NEGATIVE
UNIT DIVISION: 0
Unit division: 0

## 2016-04-19 LAB — COMPREHENSIVE METABOLIC PANEL
ALBUMIN: 2.9 g/dL — AB (ref 3.5–5.0)
ALT: 16 U/L (ref 14–54)
AST: 20 U/L (ref 15–41)
Alkaline Phosphatase: 45 U/L (ref 38–126)
Anion gap: 4 — ABNORMAL LOW (ref 5–15)
BUN: 10 mg/dL (ref 6–20)
CHLORIDE: 104 mmol/L (ref 101–111)
CO2: 30 mmol/L (ref 22–32)
CREATININE: 0.68 mg/dL (ref 0.44–1.00)
Calcium: 8.4 mg/dL — ABNORMAL LOW (ref 8.9–10.3)
GFR calc Af Amer: >60 mL/min (ref >60)
GFR calc non Af Amer: >60 mL/min (ref >60)
Glucose, Bld: 208 mg/dL — ABNORMAL HIGH (ref 65–99)
POTASSIUM: 4.1 mmol/L (ref 3.5–5.1)
SODIUM: 138 mmol/L (ref 135–145)
TOTAL PROTEIN: 5.9 g/dL — AB (ref 6.5–8.1)
Total Bilirubin: 0.1 mg/dL — ABNORMAL LOW (ref 0.3–1.2)

## 2016-04-19 LAB — PREPARE RBC (CROSSMATCH)

## 2016-04-19 LAB — AMYLASE: Amylase: 54 U/L (ref 28–100)

## 2016-04-19 MED ORDER — DEXTROSE-NACL 5-0.45 % IV SOLN
INTRAVENOUS | Status: DC
  Administered 2016-04-19 – 2016-04-22 (×6): via INTRAVENOUS

## 2016-04-19 MED ORDER — MAGNESIUM HYDROXIDE 400 MG/5ML PO SUSP
30.0000 mL | Freq: Once | ORAL | Status: AC
  Administered 2016-04-19: 30 mL via ORAL
  Filled 2016-04-19: qty 30

## 2016-04-19 MED ORDER — ALBUTEROL SULFATE (2.5 MG/3ML) 0.083% IN NEBU: 2.5000 mg | INHALATION_SOLUTION | Freq: Four times a day (QID) | RESPIRATORY_TRACT | Status: DC | PRN

## 2016-04-19 MED ORDER — TRAMADOL HCL 50 MG PO TABS
50.0000 mg | ORAL_TABLET | Freq: Four times a day (QID) | ORAL | Status: DC
  Administered 2016-04-19 – 2016-04-24 (×20): 50 mg via ORAL
  Filled 2016-04-19 (×20): qty 1

## 2016-04-19 NOTE — Progress Notes (Signed)
Complain of abd pain and no BM since surgery.  History of chronic constipation with multiple laxatives  Abd is distended but not really tender.  Active bowel sounds.  Rectal without stool or masses  CXRay from today shows large stomach bubble  Will check amylase, CBC, met c Will keep NPO except for ice chips Will check abd films.

## 2016-04-19 NOTE — Addendum Note (Signed)
Addendum  created 04/19/16 1000 by Johnna Acosta, CRNA   Sign clinical note

## 2016-04-19 NOTE — Progress Notes (Signed)
Did better last evening.  Eating better.  Less pain  Wounds redressed.  All clean and dry. Lungs with decreased breath sounds on the left Heart regular  No air leak seen  Will place chest tubes to water seal Remove Foley Discontinue IV fluids

## 2016-04-19 NOTE — Anesthesia Postprocedure Evaluation (Signed)
Anesthesia Post Note  Patient: Sheila Suarez  Procedure(s) Performed: Procedure(s) (LRB): FLEXIBLE BRONCHOSCOPY (N/A) THORACOTOMY LEFT UPPER LOBE LUNG RESECTION (Left)  Patient location during evaluation: Nursing Unit Anesthesia Type: Epidural Level of consciousness: awake, awake and alert and oriented Pain management: pain level controlled Vital Signs Assessment: post-procedure vital signs reviewed and stable Respiratory status: spontaneous breathing, nonlabored ventilation and respiratory function stable Cardiovascular status: blood pressure returned to baseline and stable Postop Assessment: no headache, no backache and patient able to bend at knees    Last Vitals:  Vitals:   04/19/16 0056 04/19/16 0426  BP:  (!) 112/56  Pulse:  71  Resp: 19 20  Temp:  37.3 C    Last Pain:  Vitals:   04/19/16 0427  TempSrc:   PainSc: 5                  [employer]

## 2016-04-19 NOTE — Anesthesia Post-op Follow-up Note (Signed)
  Anesthesia Pain Follow-up Note  Patient: Sheila Suarez  Day #: 2  Date of Follow-up: 04/19/2016 Time: 10:00 AM  Last Vitals:  Vitals:   04/19/16 0056 04/19/16 0426  BP:  (!) 112/56  Pulse:  71  Resp: 19 20  Temp:  37.3 C    Level of Consciousness: alert  Pain: mild   Side Effects:Pruritis  Catheter Site Exam:clean, dry, no drainage  Epidural / Intrathecal    Start     Dose/Rate Route Frequency Ordered Stop   04/17/16 1245  fentaNYL 2.5 mcg/ml w/ropivacaine 0.2% (preservative free) in normal saline 100 mL EPIDURAL Infusion in 150 ml Intravia Bag     8 mL/hr 8 mL/hr  Epidural Continuous 04/17/16 1238         Plan: Continue current therapy  [employer]

## 2016-04-20 LAB — SURGICAL PATHOLOGY

## 2016-04-20 NOTE — Anesthesia Post-op Follow-up Note (Signed)
  Anesthesia Pain Follow-up Note  Patient: Sheila Suarez  Day #: 3  Date of Follow-up: 04/20/2016 Time: 8:31 AM  Last Vitals:  Vitals:   04/20/16 0518 04/20/16 0820  BP: 133/67 123/69  Pulse: 85 71  Resp: 17   Temp: 36.9 C 36.3 C    Level of Consciousness: alert  Pain: 6 /10   Side Effects:None  Catheter Site Exam:clean, dry, no drainage  Epidural / Intrathecal    Start     Dose/Rate Route Frequency Ordered Stop   04/17/16 1245  fentaNYL 2.5 mcg/ml w/ropivacaine 0.2% (preservative free) in normal saline 100 mL EPIDURAL Infusion in 150 ml Intravia Bag     8 mL/hr 8 mL/hr  Epidural Continuous 04/17/16 1238         Plan: Continue current therapy  [name]

## 2016-04-20 NOTE — Progress Notes (Signed)
Physical Therapy Treatment Patient Details Name: Sheila Suarez MRN: [id number] DOB: Jan 07, 1956 Today's Date: 04/20/2016    History of Present Illness Pt is admitted for L thoracotomy and upper lobectomy to remove L lung mass on 04/17/16. Pt now has L chest tube as well as epidural in place.    PT Comments    Pt getting up with nursing upon arrival to use bathroom.  She was able to continue ambulating around nursing unit x 1 with slow and steady gait.  Pt did complain of some chest discomfort which she stated was no cardiac in nature.  HR in low 90's during mobility skills.  Pt with increase ambulation distances this pm.  Was offered rest breaks several times but she declined stating "I can do it" but upon return to bed stated she "might have overdone it".  Pt positioned to comfort in bed.    Follow Up Recommendations  Home health PT;Supervision for mobility/OOB     Equipment Recommendations  Rolling walker with 5" wheels    Recommendations for Other Services       Precautions / Restrictions Precautions Precautions: Fall Restrictions Weight Bearing Restrictions: No    Mobility  Bed Mobility Overal bed mobility: + 2 for safety/equipment;Needs Assistance (assist only for equipment) Bed Mobility: Supine to Sit;Sit to Supine     Supine to sit: Min guard Sit to supine: Min guard      Transfers Overall transfer level: Needs assistance Equipment used: Rolling walker (2 wheeled) Transfers: Sit to/from Stand Sit to Stand: Min guard;+2 safety/equipment            Ambulation/Gait Ambulation/Gait assistance: Min guard;+2 safety/equipment Ambulation Distance (Feet): 190 Feet Assistive device: Rolling walker (2 wheeled) Gait Pattern/deviations: Step-through pattern   Gait velocity interpretation: Below normal speed for age/gender General Gait Details: gait slow and cautious but generally steady   Science writer    Modified Rankin  (Stroke Patients Only)       Balance Overall balance assessment: Needs assistance Sitting-balance support: Feet supported Sitting balance-Leahy Scale: Normal     Standing balance support: Bilateral upper extremity supported Standing balance-Leahy Scale: Good                      Cognition Arousal/Alertness: Awake/alert Behavior During Therapy: WFL for tasks assessed/performed Overall Cognitive Status: Within Functional Limits for tasks assessed                      Exercises Other Exercises Other Exercises: amb to/from bathroom with min guard +2 for equipment    General Comments        Pertinent Vitals/Pain Pain Assessment: 0-10 Pain Score: 6  Pain Location: L chest Pain Descriptors / Indicators: Operative site guarding;Sore Pain Intervention(s): Monitored during session;Limited activity within patient's tolerance    Home Living                      Prior Function            PT Goals (current goals can now be found in the care plan section)      Frequency    Min 2X/week      PT Plan Current plan remains appropriate    Co-evaluation             End of Session   Activity Tolerance: Patient limited by fatigue Patient left: in bed;with bed alarm  set;with nursing/sitter in room;with SCD's reapplied     Time: 4327-6147 PT Time Calculation (min) (ACUTE ONLY): 23 min  Charges:  $Gait Training: 8-22 mins $Therapeutic Activity: 8-22 mins                    G Codes:      Chesley Noon, PTA 04/20/16, 1:58 PM

## 2016-04-20 NOTE — Progress Notes (Signed)
Pt Epidural continued to read occluded on the IV channel. Primary Nurse paged spoke to anesthesia Dr. Dian Situ. Orders were received to changed out channel. Primary nurse to continue to monitor pt.

## 2016-04-20 NOTE — Progress Notes (Signed)
  Patient ID: Sheila Suarez, female   DOB: 11-08-1955, 60 y.o.   MRN: [id number]  HITORY: Better today.  Passing gas.  Less distention.  Hungry   Vitals:   04/20/16 0518 04/20/16 0820  BP: 133/67 123/69  Pulse: 85 71  Resp: 17   Temp: 98.4 F (36.9 C) 97.4 F (36.3 C)     EXAM:    Resp: Lungs are clear bilaterally but decreased on the left.  No respiratory distress, normal effort. Heart:  Regular without murmurs Abd:  Abdomen is soft, slightly distended and non tender. No masses are palpable.  There is no rebound and no guarding.  Neurological: Alert and oriented to person, place, and time. Coordination normal.  Skin: Skin is warm and dry. No rash noted. No diaphoretic. No erythema. No pallor.  Psychiatric: Normal mood and affect. Normal behavior. Judgment and thought content normal.    ASSESSMENT: Resolving ileus    PLAN:   I have discussed the path with the patient and I have personally cut the specimen with Dr. Reuel Derby.  There is no tumor identified.   We will advance diet. We will check chest xray tomorrow.    [name], MD

## 2016-04-21 ENCOUNTER — Inpatient Hospital Stay: Payer: BLUE CROSS/BLUE SHIELD

## 2016-04-21 MED ORDER — LACTULOSE 10 GM/15ML PO SOLN
20.0000 g | Freq: Two times a day (BID) | ORAL | Status: DC | PRN
  Administered 2016-04-21 – 2016-04-24 (×2): 20 g via ORAL
  Filled 2016-04-21 (×3): qty 30

## 2016-04-21 MED ORDER — POLYETHYLENE GLYCOL 3350 17 G PO PACK
17.0000 g | PACK | Freq: Every day | ORAL | Status: DC
  Administered 2016-04-21 – 2016-04-24 (×4): 17 g via ORAL
  Filled 2016-04-21 (×5): qty 1

## 2016-04-21 MED ORDER — BISACODYL 10 MG RE SUPP
10.0000 mg | Freq: Once | RECTAL | Status: AC
  Administered 2016-04-21: 10 mg via RECTAL
  Filled 2016-04-21: qty 1

## 2016-04-21 NOTE — Progress Notes (Signed)
No BM since 9/17.  Scheduled dulcolax PO given this am.  Dr Hampton Abbot notified and is putting in orders

## 2016-04-21 NOTE — Progress Notes (Signed)
Subjective:   She is having moderate amount of abdominal pain but overall feels better. She's breathing comfortably. She has no respiratory distress. She has no air leak this morning.  Vital signs in last 24 hours: Temp:  [97.4 F (36.3 C)-98.5 F (36.9 C)] 98.4 F (36.9 C) (09/22 0553) Pulse Rate:  [68-84] 68 (09/22 0553) Resp:  [17-20] 18 (09/22 0553) BP: (121-145)/(69-72) 145/72 (09/22 0553) SpO2:  [97 %-99 %] 97 % (09/22 0553) Last BM Date: 04/16/16  Intake/Output from previous day: 09/21 0701 - 09/22 0700 In: 2096.1 [P.O.:240; I.V.:1678.8] Out: 8185 [Urine:1550; Chest Tube:180]  Exam:  Her chest is clear with no adventitious sounds but she does have some chest tube sounds on the left side.  Lab Results:  CBC  Recent Labs  04/19/16 1522  WBC 10.3  HGB 10.6*  HCT 31.3*  PLT 274   CMP     Component Value Date/Time   NA 138 04/19/2016 1522   K 4.1 04/19/2016 1522   CL 104 04/19/2016 1522   CO2 30 04/19/2016 1522   GLUCOSE 208 (H) 04/19/2016 1522   BUN 10 04/19/2016 1522   CREATININE 0.68 04/19/2016 1522   CALCIUM 8.4 (L) 04/19/2016 1522   PROT 5.9 (L) 04/19/2016 1522   ALBUMIN 2.9 (L) 04/19/2016 1522   AST 20 04/19/2016 1522   ALT 16 04/19/2016 1522   ALKPHOS 45 04/19/2016 1522   BILITOT 0.1 (L) 04/19/2016 1522   GFRNONAA >60 04/19/2016 1522   GFRAA >60 04/19/2016 1522   PT/INR No results for input(s): LABPROT, INR in the last 72 hours.  Studies/Results: Dg Chest 2 View  Result Date: 04/19/2016 CLINICAL DATA:  Post left upper lobectomy for left lung lesion EXAM: CHEST  2 VIEW COMPARISON:  Chest x-ray of 04/17/2016 and PET-CT of 02/15/2016 FINDINGS: There is volume loss of the left lung after left upper lobectomy. A left chest tube is present and no pneumothorax is seen. The right lung is clear. Heart size is stable. IMPRESSION: Left chest tube present after left upper lobectomy. No pneumothorax. Electronically Signed   By: Ivar Drape M.D.   On: 04/19/2016  14:44   Dg Abd 2 Views  Result Date: 04/19/2016 CLINICAL DATA:  Patient status post left thoracotomy 04/17/2016. No bowel movement since the procedure. Abdominal distention. EXAM: ABDOMEN - 2 VIEW COMPARISON:  None. FINDINGS: There is diffuse gaseous distention of the colon. Mild to moderate gaseous distention of the stomach is also identified. No evidence of small bowel obstruction is seen. Large volume of stool in the ascending and transverse colon is identified. Calcified uterine fibroid is noted. IMPRESSION: Findings most compatible with colonic ileus with prominent stool seen in the ascending and transverse colon. Mild to moderate gaseous distention of the stomach. Electronically Signed   By: Inge Rise M.D.   On: 04/19/2016 16:15    Assessment/Plan: We will repeat her chest x-ray this morning. I talk with her by removing one of her chest tubes if she has a stable chest x-ray. She is in agreement

## 2016-04-21 NOTE — Progress Notes (Signed)
Today's chest x-ray shows a very small apical and lateral pneumothorax. There is no air leak. Her posterior chest tube was removed without difficulty. Chest x-ray is planned for tomorrow morning.

## 2016-04-21 NOTE — Care Management (Signed)
Patient admitted from home.  S/p left thoracotomy with left upper lobectomy.  PCP [facility].  Patient on RA.  Patient has been walking with PT using and RW.  PT recommending home health PT.  Patient currently has 2 chest tubes y sided.  Plan to re xray tomorrow, and remove one if stable.  RNCM following for discharge needs.

## 2016-04-21 NOTE — Progress Notes (Signed)
Physical Therapy Treatment Patient Details Name: Sheila Suarez MRN: [id number] DOB: 1955-10-23 Today's Date: 04/21/2016    History of Present Illness Pt is admitted for L thoracotomy and upper lobectomy to remove L lung mass on 04/17/16. Pt now has L chest tube as well as epidural in place.    PT Comments    Pt is making good progress towards goals with increased ambulation this date, able to ambulate full nurses station, although did require 1 standing rest break secondary to pain. MD removed 1 chest tube prior to ambulation. Pt remains motivated to continue mobility efforts and reports she has been ambulating every day with RN staff as well. Still requires RW for assist at this time.  Follow Up Recommendations  Home health PT;Supervision for mobility/OOB     Equipment Recommendations  Rolling walker with 5" wheels    Recommendations for Other Services       Precautions / Restrictions Precautions Precautions: Fall Restrictions Weight Bearing Restrictions: No    Mobility  Bed Mobility Overal bed mobility: Needs Assistance Bed Mobility: Supine to Sit     Supine to sit: Supervision     General bed mobility comments: Safe technique with therapist assistance for lines/leads.  Transfers Overall transfer level: Needs assistance Equipment used: Rolling walker (2 wheeled) Transfers: Sit to/from Stand Sit to Stand: Supervision         General transfer comment: able to stand with upright posture and safe technique using RW. Once standing, pt able to maintain balance while therapist management line/leads  Ambulation/Gait Ambulation/Gait assistance: Min guard Ambulation Distance (Feet): 200 Feet Assistive device: Rolling walker (2 wheeled) Gait Pattern/deviations: Step-through pattern   Gait velocity interpretation: Below normal speed for age/gender General Gait Details: ambulated using reciprocal gait pattern. Slow cautious stepping noted with 1 standing rest break  secondary to pain.    Stairs            Wheelchair Mobility    Modified Rankin (Stroke Patients Only)       Balance                                    Cognition Arousal/Alertness: Awake/alert Behavior During Therapy: WFL for tasks assessed/performed Overall Cognitive Status: Within Functional Limits for tasks assessed                      Exercises Other Exercises Other Exercises: exercises not performed this date secondary to pain    General Comments        Pertinent Vitals/Pain Pain Assessment: Faces Faces Pain Scale: Hurts even more Pain Location: L chest  Pain Descriptors / Indicators: Operative site guarding Pain Intervention(s): Limited activity within patient's tolerance;Repositioned    Home Living                      Prior Function            PT Goals (current goals can now be found in the care plan section) Acute Rehab PT Goals Patient Stated Goal: to get stronger PT Goal Formulation: With patient Time For Goal Achievement: 05/02/16 Potential to Achieve Goals: Good Progress towards PT goals: Progressing toward goals    Frequency    Min 2X/week      PT Plan Current plan remains appropriate    Co-evaluation             End of Session Equipment  Utilized During Treatment: Gait belt Activity Tolerance: Patient limited by fatigue Patient left: in bed;with bed alarm set;with nursing/sitter in room;with SCD's reapplied     Time: 1440-1503 PT Time Calculation (min) (ACUTE ONLY): 23 min  Charges:  $Gait Training: 23-37 mins                    G Codes:      Sheila Suarez 15-May-2016, 3:37 PM  Sheila Suarez, PT, DPT [phone]

## 2016-04-21 NOTE — Anesthesia Post-op Follow-up Note (Signed)
  Anesthesia Pain Follow-up Note  Patient: Sheila Suarez  Day #: 4  Date of Follow-up: 04/21/2016 Time: 7:23 AM  Last Vitals:  Vitals:   04/21/16 0322 04/21/16 0553  BP:  (!) 145/72  Pulse:  68  Resp: 17 18  Temp:  36.9 C    Level of Consciousness: alert  Pain: mild   Side Effects:None  Catheter Site Exam:clean, dry, no drainage  Epidural / Intrathecal    Start     Dose/Rate Route Frequency Ordered Stop   04/17/16 1245  fentaNYL 2.5 mcg/ml w/ropivacaine 0.2% (preservative free) in normal saline 100 mL EPIDURAL Infusion in 150 ml Intravia Bag     8 mL/hr 8 mL/hr  Epidural Continuous 04/17/16 1238         Plan: Continue current therapy  [employer]

## 2016-04-22 ENCOUNTER — Inpatient Hospital Stay: Payer: BLUE CROSS/BLUE SHIELD

## 2016-04-22 NOTE — Progress Notes (Signed)
5 Days Post-Op   Subjective:  60 year old female 5 days status post left thoracotomy. Doing very well. States she still feels a bubbling sensation in her left chest. She denies any pain and has been tolerating a diet.  Vital signs in last 24 hours: Temp:  [98.2 F (36.8 C)] 98.2 F (36.8 C) (09/23 0617) Pulse Rate:  [63-71] 63 (09/23 0617) Resp:  [16-28] 16 (09/23 0617) BP: (135-159)/(61-85) 135/61 (09/23 0617) SpO2:  [96 %-98 %] 97 % (09/23 0617) Weight:  [50.8 kg (112 lb)] 50.8 kg (112 lb) (09/22 1118) Last BM Date: 04/21/16  Intake/Output from previous day: 09/22 0701 - 09/23 0700 In: 2417.1 [P.O.:480; I.V.:1761.8] Out: 1660 [Urine:1500; Chest Tube:160]  Chest: Left-sided chest tube remains in place. Small air leak on maximal inspiration and expiration. GI: soft, non-tender; bowel sounds normal; no masses,  no organomegaly  Lab Results:  CBC  Recent Labs  04/19/16 1522  WBC 10.3  HGB 10.6*  HCT 31.3*  PLT 274   CMP     Component Value Date/Time   NA 138 04/19/2016 1522   K 4.1 04/19/2016 1522   CL 104 04/19/2016 1522   CO2 30 04/19/2016 1522   GLUCOSE 208 (H) 04/19/2016 1522   BUN 10 04/19/2016 1522   CREATININE 0.68 04/19/2016 1522   CALCIUM 8.4 (L) 04/19/2016 1522   PROT 5.9 (L) 04/19/2016 1522   ALBUMIN 2.9 (L) 04/19/2016 1522   AST 20 04/19/2016 1522   ALT 16 04/19/2016 1522   ALKPHOS 45 04/19/2016 1522   BILITOT 0.1 (L) 04/19/2016 1522   GFRNONAA >60 04/19/2016 1522   GFRAA >60 04/19/2016 1522   PT/INR No results for input(s): LABPROT, INR in the last 72 hours.  Studies/Results: Dg Chest Port 1 View  Result Date: 04/22/2016 CLINICAL DATA:  Status post thoracotomy. Followup left pneumothorax. EXAM: PORTABLE CHEST 1 VIEW COMPARISON:  Yesterday. FINDINGS: The cardiac silhouette remains borderline enlarged. The lungs remain mildly hyperexpanded with mildly prominent interstitial markings. The left hemidiaphragm remains mildly elevated. Small amount of  right pleural fluid. Decrease in size of a tiny left lateral pneumothorax. One left chest tube has been removed and one remains in place. Mild lower thoracic spine degenerative changes. Diffuse osteopenia IMPRESSION: 1. Decreased size of a tiny left lateral pneumothorax. 2. Stable mild changes of COPD. 3. Small right pleural effusion. Electronically Signed   By: Claudie Revering M.D.   On: 04/22/2016 07:54   Dg Chest Port 1 View  Result Date: 04/21/2016 CLINICAL DATA:  LEFT thoracotomy and upper lobectomy for LEFT lung mass on 04/17/2016, LEFT chest tubes, history coronary artery disease, lupus, former smoker EXAM: PORTABLE CHEST 1 VIEW COMPARISON:  Portable exam 0757 hours compared to 04/19/2016 FINDINGS: LEFT thoracostomy tubes again identified. Upper normal heart size. Mediastinal contours and pulmonary vascularity normal. Emphysematous and minimal bronchitic changes compatible with COPD. Tiny residual LEFT pneumothorax laterally at upper chest. Minimal LEFT basilar atelectasis. Lungs otherwise clear. No segmental consolidation or pleural effusion. Diffuse osseous demineralization. IMPRESSION: LEFT thoracostomy tubes with tiny LEFT pneumothorax noted. Electronically Signed   By: Lavonia Dana M.D.   On: 04/21/2016 08:18    Assessment/Plan: 60 year old female status post left or cautery. Doing very well. Continues to have a tiny air leak. Chest x-ray shows a stable or improved apical pneumothorax. Discussed with the patient the plan today to keep the chest tube in for 1 more day. We will repeat her chest x-ray in the morning. Encourage ambulation, incentive spirometer, oral intake.  Clayburn Pert, MD FACS General Surgeon  04/22/2016

## 2016-04-23 ENCOUNTER — Inpatient Hospital Stay: Payer: BLUE CROSS/BLUE SHIELD

## 2016-04-23 MED ORDER — HYDROMORPHONE HCL 1 MG/ML IJ SOLN
0.5000 mg | INTRAMUSCULAR | Status: DC | PRN
  Administered 2016-04-23 – 2016-04-24 (×4): 0.5 mg via INTRAVENOUS
  Filled 2016-04-23 (×4): qty 1

## 2016-04-23 MED ORDER — OXYCODONE HCL 5 MG PO TABS
5.0000 mg | ORAL_TABLET | ORAL | Status: DC | PRN
  Administered 2016-04-23 (×2): 5 mg via ORAL
  Administered 2016-04-23 – 2016-04-24 (×2): 10 mg via ORAL
  Filled 2016-04-23: qty 1
  Filled 2016-04-23: qty 2
  Filled 2016-04-23: qty 1
  Filled 2016-04-23: qty 2

## 2016-04-23 MED ORDER — ACETAMINOPHEN 325 MG PO TABS
650.0000 mg | ORAL_TABLET | Freq: Four times a day (QID) | ORAL | Status: DC | PRN
  Administered 2016-04-24: 650 mg via ORAL
  Filled 2016-04-23: qty 2

## 2016-04-23 NOTE — Progress Notes (Signed)
04/23/2016  Subjective: No acute events overnight. Remaining chest tube has remained to waterseal with no complications. Reports pain is well controlled. Patient has been ambulating.  Vital signs: Temp:  [98.4 F (36.9 C)-98.6 F (37 C)] 98.6 F (37 C) (09/24 1234) Pulse Rate:  [61-70] 61 (09/24 1234) Resp:  [16-19] 18 (09/24 1234) BP: (124-138)/(66-85) 124/66 (09/24 1234) SpO2:  [96 %-99 %] 99 % (09/24 1234)   Intake/Output: 09/23 0701 - 09/24 0700 In: 1510.9 [P.O.:480; I.V.:862] Out: 2481 [Urine:2950; Chest Tube:85] Last BM Date: 04/21/16  Physical Exam: Constitutional:  No acute distress Cardiac:  Regular rhythm and rate Pulm: No respiratory distress and lungs are clear. There is a left-sided chest tube in place currently to waterseal. The left thoracotomy incision is clean dry and intact with staples. Abdomen: Soft nontender nondistended  Labs:  No results for input(s): WBC, HGB, HCT, PLT in the last 72 hours. No results for input(s): NA, K, CL, CO2, GLUCOSE, BUN, CREATININE, CALCIUM in the last 72 hours.  Invalid input(s): MAGNESIUM No results for input(s): LABPROT, INR in the last 72 hours.  Assessment/Plan: This a 60 year old female status post left thoracotomy for left upper lobectomy. Patient is currently doing well and one chest tube was already removed.  -We'll review this morning's chest x-ray and potentially remove the last left-sided chest tube today. It were able to do this we'll also discontinue the epidural and start transitioning the patient to oral pain medications. -Continue encouraging ambulation. -Continue bowel regimen. -Chest x-ray in a.m. as well   Melvyn Neth, [facility]

## 2016-04-23 NOTE — Plan of Care (Signed)
Problem: Pain Managment: Goal: General experience of comfort will improve Outcome: Progressing After removal of epidural, pain has been significantly worse than patient expected.  Encouraged on rating of pain and staggering pain medications for better pain control.

## 2016-04-23 NOTE — Progress Notes (Signed)
Chest tube removed earlier per MD. Epidural removed per MD orders by RN. Patient now complaining of significant pain at incision site.  There is swelling around incision site. MD notified and advised he will come and look at patients incision. Lauris Poag, RN 04/23/16 [phone]

## 2016-04-23 NOTE — Progress Notes (Signed)
Per MD, incision site unchanged, continue with pain medication s/p epidural removal. Patient given pain medication as allowed by [facility] and showing improvement, less anxious, more relaxed. Lauris Poag, RN 04/23/16 [phone]

## 2016-04-24 ENCOUNTER — Telehealth: Payer: Self-pay

## 2016-04-24 MED ORDER — TRAMADOL HCL 50 MG PO TABS: 50.0000 mg | ORAL_TABLET | Freq: Four times a day (QID) | ORAL | 0 refills | Status: DC

## 2016-04-24 MED ORDER — OXYCODONE HCL 5 MG PO TABS: 5.0000 mg | ORAL_TABLET | ORAL | 0 refills | Status: DC | PRN

## 2016-04-24 NOTE — Progress Notes (Signed)
Physical Therapy Treatment Patient Details Name: Sheila Suarez MRN: [id number] DOB: 1955-11-28 Today's Date: 04/24/2016    History of Present Illness Pt is admitted for L thoracotomy and upper lobectomy to remove L lung mass on 04/17/16. Pt now has L chest tube as well as epidural in place.    PT Comments    Pt is making good progress towards goals with ability to ambulate without AD this date. Some unsteadiness noted, educated to continue use of RW in home environment to prevent falls. CM notified. Pt very motivated to return home.   Follow Up Recommendations  Home health PT     Equipment Recommendations  Rolling walker with 5" wheels    Recommendations for Other Services       Precautions / Restrictions Precautions Precautions: Fall Restrictions Weight Bearing Restrictions: No    Mobility  Bed Mobility Overal bed mobility: Modified Independent Bed Mobility: Supine to Sit     Supine to sit: Modified independent (Device/Increase time)     General bed mobility comments: safe technique. Used bed rail for mobility.   Transfers Overall transfer level: Needs assistance Equipment used: None Transfers: Sit to/from Stand Sit to Stand: Supervision         General transfer comment: able to initiate transfer from seated surface. Once standing, able to stand with upright posture  Ambulation/Gait Ambulation/Gait assistance: Supervision Ambulation Distance (Feet): 200 Feet Assistive device: None   Gait velocity: able to ambulate 10' in 16 seconds   General Gait Details: ambulated using reciprocal gait pattern and no AD. Pt ambulates with very slow gait speed and occasionally holds on to railing in hallway. No LOB noted   Stairs            Wheelchair Mobility    Modified Rankin (Stroke Patients Only)       Balance                                    Cognition Arousal/Alertness: Awake/alert Behavior During Therapy: WFL for tasks  assessed/performed Overall Cognitive Status: Within Functional Limits for tasks assessed                      Exercises      General Comments        Pertinent Vitals/Pain Pain Assessment: Faces Faces Pain Scale: Hurts a little bit Pain Location: L chest Pain Descriptors / Indicators: Operative site guarding Pain Intervention(s): Limited activity within patient's tolerance;Repositioned    Home Living                      Prior Function            PT Goals (current goals can now be found in the care plan section) Acute Rehab PT Goals Patient Stated Goal: to get stronger PT Goal Formulation: With patient Time For Goal Achievement: 05/02/16 Potential to Achieve Goals: Good Progress towards PT goals: Progressing toward goals    Frequency    Min 2X/week      PT Plan Current plan remains appropriate    Co-evaluation             End of Session   Activity Tolerance: Patient limited by fatigue Patient left: in chair     Time: 0922-0936 PT Time Calculation (min) (ACUTE ONLY): 14 min  Charges:  $Gait Training: 8-22 mins  G Codes:      Zachery Niswander 04/24/2016, 10:02 AM Greggory Stallion, PT, DPT [phone]

## 2016-04-24 NOTE — Progress Notes (Signed)
Discharge instructions reviewed with the pt and her sister. RX given to the pt for oxycodone and tramadol.  pt being sent out via wheelchair with belongings

## 2016-04-24 NOTE — Discharge Summary (Signed)
Physician Discharge Summary  Patient ID: Sheila Suarez MRN: [id number] DOB/AGE: 1955/08/08 60 y.o.  Admit date: 04/17/2016 Discharge date: 04/24/2016   Discharge Diagnoses:  Active Problems:   Lung mass   Procedures: Left thoracotomy and left upper lobectomy   Hospital Course: Admitted after surgery for an enlarging left upper lobe mass.  Pathology of left upper lobe was scar and no carcinoma.  Did well postop.  Had ileus secondary to colonic pseudo-obstruction.  At the time of discharge, her wounds were healing as expected and chest xray showed miniscule apical pneumothorax.  Disposition: 01-Home or Self Care  Discharge Instructions    Call MD for:  difficulty breathing, headache or visual disturbances    Complete by:  As directed    Call MD for:  redness, tenderness, or signs of infection (pain, swelling, redness, odor or green/yellow discharge around incision site)    Complete by:  As directed    Diet - low sodium heart healthy    Complete by:  As directed    Increase activity slowly    Complete by:  As directed        Medication List    TAKE these medications   oxyCODONE 5 MG immediate release tablet Commonly known as:  Oxy IR/ROXICODONE Take 1-2 tablets (5-10 mg total) by mouth every 4 (four) hours as needed for severe pain.   traMADol 50 MG tablet Commonly known as:  ULTRAM Take 1 tablet (50 mg total) by mouth every 6 (six) hours.        [name], MD

## 2016-04-24 NOTE — Final Progress Note (Signed)
Did well yesterday.  All chest tubes removed.  Eating well.  Walking.  Had bowel movement and abdomen much improved  Wounds clean and dry Lungs decreased on the left Heart is regular  CXRay from yesterday looks good  Discharge instructions given  Will see at the end of this week with a chest xray.  [employer].

## 2016-04-24 NOTE — Telephone Encounter (Signed)
Patient returned my call and I informed her of her appointment with Dr. Genevive Bi on Friday 04/28/2016 at 10:30 AM and before that she needed to go to the [place] to have her chest X-Ray done first. Patient agreed and had no further questions.

## 2016-04-24 NOTE — Telephone Encounter (Signed)
Called patient at this time and wasn't able to leave her a voicemail since it's not set-up yet. I need to tell patient that she has a post-operation appointment with Dr. Genevive Bi on Friday 04/28/2016 at 10:15 AM. Patient will need a Chest X-Ray prior to her appointment.

## 2016-04-24 NOTE — Care Management (Signed)
Patient lives at home with daughter.  Lexicographer.  Patient has had both chest tubes removed, and is on RA.  Home health PT, RW and BSC ordered.  Patient was provided with agency preference list. [facility] selected.  Corene Cornea with [facility] notified of discharge. Equipment delivered to room.  RNCM signing off

## 2016-04-27 ENCOUNTER — Other Ambulatory Visit: Payer: Self-pay

## 2016-04-27 ENCOUNTER — Telehealth: Payer: Self-pay

## 2016-04-27 DIAGNOSIS — R918 Other nonspecific abnormal finding of lung field: Secondary | ICD-10-CM

## 2016-04-27 NOTE — Telephone Encounter (Signed)
Screening Colonoscopy Z12.11 EGD: Weight loss R63.4 [facility] 76/72/0947 [employer]  Pre cert is not required

## 2016-04-27 NOTE — Telephone Encounter (Signed)
Gastroenterology Pre-Procedure Review  Request Date: 06/20/16 Requesting Physician: Dr. Dian Situ  PATIENT REVIEW QUESTIONS: The patient responded to the following health history questions as indicated:    1. Are you having any GI issues? yes (constipation ) 2. Do you have a personal history of Polyps? no 3. Do you have a family history of Colon Cancer or Polyps? no 4. Diabetes Mellitus? no 5. Joint replacements in the past 12 months?no 6. Major health problems in the past 3 months?yes (Partial lung removal 04/17/16) 7. Any artificial heart valves, MVP, or defibrillator?no    MEDICATIONS & ALLERGIES:    Patient reports the following regarding taking any anticoagulation/antiplatelet therapy:   Plavix, Coumadin, Eliquis, Xarelto, Lovenox, Pradaxa, Brilinta, or Effient? no Aspirin? no  Patient confirms/reports the following medications:  Current Outpatient Prescriptions  Medication Sig Dispense Refill  . oxyCODONE (OXY IR/ROXICODONE) 5 MG immediate release tablet Take 1-2 tablets (5-10 mg total) by mouth every 4 (four) hours as needed for severe pain. 60 tablet 0  . traMADol (ULTRAM) 50 MG tablet Take 1 tablet (50 mg total) by mouth every 6 (six) hours. 60 tablet 0   No current facility-administered medications for this visit.     Patient confirms/reports the following allergies:  No Known Allergies  No orders of the defined types were placed in this encounter.   AUTHORIZATION INFORMATION Primary Insurance: 1D#: Group #:  Secondary Insurance: 1D#: Group #:  SCHEDULE INFORMATION: Date: 06/20/2016 Time: Location: [facility]

## 2016-04-28 ENCOUNTER — Encounter: Payer: Self-pay | Admitting: *Deleted

## 2016-04-28 ENCOUNTER — Ambulatory Visit
Admission: RE | Admit: 2016-04-28 | Discharge: 2016-04-28 | Disposition: A | Discharge status: Home / Self Care | Payer: BLUE CROSS/BLUE SHIELD | Source: Ambulatory Visit | Attending: Cardiothoracic Surgery | Admitting: Cardiothoracic Surgery

## 2016-04-28 ENCOUNTER — Inpatient Hospital Stay: Payer: BLUE CROSS/BLUE SHIELD | Admitting: Hematology and Oncology

## 2016-04-28 ENCOUNTER — Encounter: Payer: Self-pay | Admitting: Cardiothoracic Surgery

## 2016-04-28 ENCOUNTER — Inpatient Hospital Stay
Admission: AD | Admit: 2016-04-28 | Discharge: 2016-05-04 | DRG: 200 | Disposition: A | Discharge status: Home Health | Payer: BLUE CROSS/BLUE SHIELD | Source: Ambulatory Visit | Attending: Cardiothoracic Surgery | Admitting: Cardiothoracic Surgery

## 2016-04-28 ENCOUNTER — Ambulatory Visit (INDEPENDENT_AMBULATORY_CARE_PROVIDER_SITE_OTHER): Payer: BLUE CROSS/BLUE SHIELD | Admitting: Cardiothoracic Surgery

## 2016-04-28 ENCOUNTER — Inpatient Hospital Stay: Payer: BLUE CROSS/BLUE SHIELD

## 2016-04-28 VITALS — BP 149/75 | HR 73 | Temp 98.0°F | Wt 112.0 lb

## 2016-04-28 DIAGNOSIS — Y838 Other surgical procedures as the cause of abnormal reaction of the patient, or of later complication, without mention of misadventure at the time of the procedure: Secondary | ICD-10-CM | POA: Diagnosis present

## 2016-04-28 DIAGNOSIS — C3492 Malignant neoplasm of unspecified part of left bronchus or lung: Secondary | ICD-10-CM | POA: Diagnosis present

## 2016-04-28 DIAGNOSIS — J95811 Postprocedural pneumothorax: Secondary | ICD-10-CM | POA: Diagnosis present

## 2016-04-28 DIAGNOSIS — R918 Other nonspecific abnormal finding of lung field: Secondary | ICD-10-CM

## 2016-04-28 DIAGNOSIS — Z87891 Personal history of nicotine dependence: Secondary | ICD-10-CM | POA: Diagnosis not present

## 2016-04-28 DIAGNOSIS — I2510 Atherosclerotic heart disease of native coronary artery without angina pectoris: Secondary | ICD-10-CM | POA: Diagnosis present

## 2016-04-28 DIAGNOSIS — Z902 Acquired absence of lung [part of]: Secondary | ICD-10-CM | POA: Diagnosis not present

## 2016-04-28 DIAGNOSIS — J939 Pneumothorax, unspecified: Secondary | ICD-10-CM | POA: Diagnosis present

## 2016-04-28 DIAGNOSIS — Z800 Family history of malignant neoplasm of digestive organs: Secondary | ICD-10-CM | POA: Diagnosis not present

## 2016-04-28 DIAGNOSIS — Z8249 Family history of ischemic heart disease and other diseases of the circulatory system: Secondary | ICD-10-CM

## 2016-04-28 DIAGNOSIS — Z801 Family history of malignant neoplasm of trachea, bronchus and lung: Secondary | ICD-10-CM | POA: Diagnosis not present

## 2016-04-28 DIAGNOSIS — Z09 Encounter for follow-up examination after completed treatment for conditions other than malignant neoplasm: Secondary | ICD-10-CM

## 2016-04-28 DIAGNOSIS — Z9689 Presence of other specified functional implants: Secondary | ICD-10-CM

## 2016-04-28 LAB — CBC
HCT: 33.4 % — ABNORMAL LOW (ref 35.0–47.0)
Hemoglobin: 11.5 g/dL — ABNORMAL LOW (ref 12.0–16.0)
MCH: 30.8 pg (ref 26.0–34.0)
MCHC: 34.4 g/dL (ref 32.0–36.0)
MCV: 89.7 fL (ref 80.0–100.0)
PLATELETS: 656 10*3/uL — AB (ref 150–440)
RBC: 3.72 MIL/uL — AB (ref 3.80–5.20)
RDW: 12.7 % (ref 11.5–14.5)
WBC: 7.9 10*3/uL (ref 3.6–11.0)

## 2016-04-28 LAB — COMPREHENSIVE METABOLIC PANEL
ALT: 16 U/L (ref 14–54)
AST: 15 U/L (ref 15–41)
Albumin: 3.3 g/dL — ABNORMAL LOW (ref 3.5–5.0)
Alkaline Phosphatase: 53 U/L (ref 38–126)
Anion gap: 5 (ref 5–15)
BUN: 13 mg/dL (ref 6–20)
CHLORIDE: 103 mmol/L (ref 101–111)
CO2: 29 mmol/L (ref 22–32)
CREATININE: 0.66 mg/dL (ref 0.44–1.00)
Calcium: 9.4 mg/dL (ref 8.9–10.3)
GFR calc Af Amer: >60 mL/min (ref >60)
GFR calc non Af Amer: >60 mL/min (ref >60)
Glucose, Bld: 108 mg/dL — ABNORMAL HIGH (ref 65–99)
Potassium: 4.7 mmol/L (ref 3.5–5.1)
SODIUM: 137 mmol/L (ref 135–145)
Total Bilirubin: 0.2 mg/dL — ABNORMAL LOW (ref 0.3–1.2)
Total Protein: 7.1 g/dL (ref 6.5–8.1)

## 2016-04-28 LAB — APTT: APTT: 49 s — AB (ref 24–36)

## 2016-04-28 MED ORDER — MIDAZOLAM HCL 2 MG/2ML IJ SOLN
INTRAMUSCULAR | Status: AC
  Filled 2016-04-28: qty 2

## 2016-04-28 MED ORDER — OXYCODONE-ACETAMINOPHEN 5-325 MG PO TABS
1.0000 | ORAL_TABLET | ORAL | Status: DC | PRN
  Administered 2016-04-28 – 2016-05-01 (×12): 2 via ORAL
  Administered 2016-05-01: 1 via ORAL
  Administered 2016-05-02 (×2): 2 via ORAL
  Filled 2016-04-28 (×4): qty 2
  Filled 2016-04-28: qty 1
  Filled 2016-04-28 (×10): qty 2

## 2016-04-28 MED ORDER — FENTANYL CITRATE (PF) 100 MCG/2ML IJ SOLN
INTRAMUSCULAR | Status: AC
  Filled 2016-04-28: qty 2

## 2016-04-28 MED ORDER — TRAMADOL HCL 50 MG PO TABS: 50.0000 mg | ORAL_TABLET | Freq: Four times a day (QID) | ORAL | 0 refills | Status: DC

## 2016-04-28 MED ORDER — MIDAZOLAM HCL 2 MG/2ML IJ SOLN
INTRAMUSCULAR | Status: DC | PRN
  Administered 2016-04-28: 2 mg via INTRAVENOUS
  Administered 2016-04-28: 1 mg via INTRAVENOUS

## 2016-04-28 MED ORDER — FENTANYL CITRATE (PF) 100 MCG/2ML IJ SOLN
INTRAMUSCULAR | Status: DC | PRN
  Administered 2016-04-28 (×3): 50 ug via INTRAVENOUS

## 2016-04-28 MED ORDER — TRAMADOL HCL 50 MG PO TABS
50.0000 mg | ORAL_TABLET | Freq: Four times a day (QID) | ORAL | Status: DC | PRN
  Administered 2016-04-29 (×2): 50 mg via ORAL
  Filled 2016-04-28: qty 1
  Filled 2016-04-28: qty 2
  Filled 2016-04-28: qty 1

## 2016-04-28 MED ORDER — SODIUM CHLORIDE 0.9 % IV SOLN
INTRAVENOUS | Status: DC
  Administered 2016-04-28: 13:00:00 via INTRAVENOUS

## 2016-04-28 MED ORDER — OXYCODONE-ACETAMINOPHEN 5-325 MG PO TABS
ORAL_TABLET | ORAL | Status: AC
  Administered 2016-04-29: 2 via ORAL
  Filled 2016-04-28: qty 2

## 2016-04-28 MED ORDER — OXYCODONE HCL 5 MG PO TABS: 5.0000 mg | ORAL_TABLET | ORAL | 0 refills | Status: DC | PRN

## 2016-04-28 NOTE — OR Nursing (Signed)
Dr Vernard Gambles called, pt reporting sudden sharp pain left chest, he assessed pt and ordered Portable Chest XRay and po pain medications.

## 2016-04-28 NOTE — Progress Notes (Signed)
She comes back today and further follow-up. She was discharged earlier this week after undergoing a left upper lobectomy. The final pathology did not reveal any evidence of malignancy. She is scheduled to get a chest x-ray today but that has not yet occurred. She is also scheduled to follow-up with Dr. Mike Gip today.  She states that she's done reasonably well since being discharged. Her appetite is been improving. She states she has occasional night sweats but no fever. No cough. She is having some postoperative discomfort and this also is improving.  Today she is afebrile. Her lungs are diminished on the left. Her heart regular. Her thoracotomy wound is well-healed. All of her sutures and staples are intact.  She will obtain a chest x-ray today and she will contact our office once that's complete to review. I will see her back again next week for suture removal.

## 2016-04-28 NOTE — Progress Notes (Signed)
Called to admit the patient to the hospital. I was given Room # 221 and as soon as patient comes back to our office, I will take her to the [place] to register her.

## 2016-04-28 NOTE — Addendum Note (Signed)
Addended by: [name] E on: 04/28/2016 11:17 AM   Modules accepted: Orders, SmartSet

## 2016-04-28 NOTE — Progress Notes (Deleted)
[facility] day:  04/28/16  Chief Complaint: Kaetlin Bullen is a 60 y.o. female with lupus and an enlarging left upper lobe nodule who is seen for assessment after interval lung resection.  HPI: The patient was last seen in the medical oncology clinic on 04/07/2016.  At that time, she was losing weight despite eating all of the time.  We discussed her upcoming lung surgery for an enlarging left upper lobe nodule.  She underwent left thoracotomy with left upper lobe lobectomy on 04/17/2016 by Dr. [name].  Biopsy revealed subpleural fibro-elastotic scar consistent with benign pulmonary cap.  There was no evidence of malignancy.  There were 2 benign lymph nodes.   Symptomatically, she continues to lose weight.  She states that she is eating all of the time.  She denies any nausea, vomiting or diarrhea.     Past Medical History:  Diagnosis Date  . Cancer ([place])    Lung  . Coronary artery disease   . Lupus ([place])   . Shortness of breath dyspnea     Past Surgical History:  Procedure Laterality Date  . CYST EXCISION Left    hand  . FLEXIBLE BRONCHOSCOPY N/A 04/17/2016   Procedure: FLEXIBLE BRONCHOSCOPY;  Surgeon: [name], MD;  Location: [facility] ORS;  Service: Thoracic;  Laterality: N/A;  . THORACOTOMY Left 04/17/2016   Procedure: THORACOTOMY LEFT UPPER LOBE LUNG RESECTION;  Surgeon: [name], MD;  Location: [facility] ORS;  Service: Thoracic;  Laterality: Left;  . TUBAL LIGATION Bilateral    37 years ago    Family History  Problem Relation Age of Onset  . Pancreatic cancer Sister   . Lung cancer Sister   . Lung cancer Brother   . Stomach cancer Paternal Grandmother   . Heart disease Father   . Cancer Daughter 65    cervical  . Cancer Maternal Aunt     brain  . Cancer Maternal Uncle     prostate  . Cancer Maternal Aunt     bone    Social History:  reports that she quit smoking about 5 weeks ago. Her smoking use included  Cigarettes. She has a 8.75 pack-year smoking history. She has never used smokeless tobacco. She reports that she drinks about 0.6 oz of alcohol per week . She reports that she does not use drugs.  She works in [place] in [employer], trade [employer].  The patient is alone today.  Allergies: No Known Allergies  Current Medications: Current Outpatient Prescriptions  Medication Sig Dispense Refill  . oxyCODONE (OXY IR/ROXICODONE) 5 MG immediate release tablet Take 1-2 tablets (5-10 mg total) by mouth every 4 (four) hours as needed for severe pain. 60 tablet 0  . traMADol (ULTRAM) 50 MG tablet Take 1 tablet (50 mg total) by mouth every 6 (six) hours. 60 tablet 0   No current facility-administered medications for this visit.     Review of Systems:  GENERAL:  Feels "ok".  No fevers.  Weight loss of 4 pounds. PERFORMANCE STATUS (ECOG):  1 HEENT:  Poor vision (near and far).  Needs glasses.  No runny nose, sore throat, mouth sores or tenderness. Lungs: Shortness of breath with exertion.  No cough.  No hemoptysis. Cardiac:  No chest pain, palpitations, orthopnea, or PND. GI:  No nausea, vomiting, diarrhea, constipation, melena or hematochezia. GU:  No urgency, frequency, dysuria, or hematuria. Musculoskeletal:  No back pain.  No joint pain.  No muscle tenderness. Extremities:  No  pain or swelling. Skin:  No rashes or skin changes. Neuro:  No headache, numbness or weakness, balance or coordination issues. Endocrine:  No diabetes, thyroid issues, hot flashes or night sweats. Psych:  No mood changes, depression or anxiety. Pain:  No focal pain. Review of systems:  All other systems reviewed and found to be negative.  Physical Exam: There were no vitals taken for this visit. GENERAL:  Thin woman sitting comfortably in the exam room in no acute distress. MENTAL STATUS:  Alert and oriented to person, place and time. HEAD:  Braided brown hair with graying.  Normocephalic, atraumatic, face symmetric, no  Cushingoid features. EYES:  Glasses.  Brown eyes.  Pupils equal round and reactive to light and accomodation.  No conjunctivitis or scleral icterus. ENT:  Oropharynx clear without lesion.  Dentures.  Tongue normal. Mucous membranes moist.  RESPIRATORY:  Clear to auscultation without rales, wheezes or rhonchi. CARDIOVASCULAR:  Regular rate and rhythm without murmur, rub or gallop. ABDOMEN:  Soft, non-tender, with active bowel sounds, and no hepatosplenomegaly.  No masses. SKIN:  No rashes, ulcers or lesions. EXTREMITIES: No edema, skin discoloration or tenderness.  No palpable cords. LYMPH NODES: No palpable cervical, supraclavicular, axillary or inguinal adenopathy  NEUROLOGICAL: Unremarkable. PSYCH:  Appropriate.   No visits with results within 3 Day(s) from this visit.  Latest known visit with results is:  Admission on 04/17/2016, Discharged on 04/24/2016  Component Date Value Ref Range Status  . ABO/RH(D) 04/17/2016 A POS   Final  . Order Confirmation 04/19/2016 ORDER PROCESSED BY BLOOD BANK   Final  . SURGICAL PATHOLOGY 04/20/2016    Final                   Value:Surgical Pathology CASE: [id number] PATIENT: Lorayne Marek Surgical Pathology Report     SPECIMEN SUBMITTED: A. Lung, left upper lobe B. L10-Hilar node #1 C. L10-Hilar node #2 D. L5-subarotic node E. Lung, left upper lobe  CLINICAL HISTORY: None provided  PRE-OPERATIVE DIAGNOSIS: Left lung mass  POST-OPERATIVE DIAGNOSIS: Same as pre-op     DIAGNOSIS: A. LUNG, LEFT UPPER LOBE; WEDGE BIOPSY: - SUBPLEURAL FIBRO-ELASTOTIC SCAR CONSISTENT WITH BENIGN PULMONARY APICAL CAP. - FOCAL OSSEOUS METAPLASIA. - OVERLYING PLEURAL FIBROUS PLAQUES. - NEGATIVE FOR MALIGNANCY.  B. L 10 LYMPH NODE, HILAR #1; EXCISION: - BENIGN LYMPHOID TISSUE.  C. L 10 LYMPH NODE, HILAR #2; EXCISION: - BENIGN LYMPHOID TISSUE.  D. L5 LYMPH NODE, SUBAORTIC; EXCISION: - BENIGN LYMPHOID TISSUE.  E. LUNG, LEFT UPPER LOBE;  LOBECTOMY: - FIBRO-ELASTOTIC SCAR MOST CONSISTENT WITH BENIGN PULMONARY APICAL CAP. - TWO BENIGN LYMPH NODES. - NEGATIVE FOR MALIGNANCY.  Comment: Pulmo                         nary apical cap [facility]) is a morphologically distinct type of fibroelastotic scar involving the lung apices. It is thought to be due to underlying chronic ischemia. These lesions are typically found in the apices of the upper lobes and in a subpleural location. A Congo Red stain is negative for amyloid. Stain controls worked appropriately. These findings were discussed with Dr. Genevive Bi on 04/20/2016.   GROSS DESCRIPTION:  A. Intraoperative Consultation:     Received: fresh     Specimen: left upper lobe     Pathologic Evaluation: frozen section     Diagnosis: FSA1, A2: 1 cm areas of atelectasis and fibrosis, no definite carcinoma identified     Communicated to: Dr. Genevive Bi At 9:42  AM on 04/17/2016, Quay Burow M.D.     Tissue submitted: A1 and A2  A. Labeled: left upper lobe  Type of procedure: wedge  Laterality: length  Weight of specimen: 13 grams  Size of specimen: 8.0 x 3.5 x 1.8 cm  Other attached structures: 8.5 cm long staple line  Specimen integrity: gro                         ssly intact with focal focally green and fibrous adhesion  Orientation: pleura-green and parenchymal margin-blue  Number of masses: 2 ill-defined areas  Size(s) of mass(es): A-1.0 x 0.8 x 0.5 cm and B-1.0 x 0.6 x 0.4 cm, 2 areas within 0.3 cm of each other  Location of mass(es): peripheral  Description of mass(es): ill-defined firm tan  Relationship of mass(es) to bronchus: cannot be determined  Margins: Bronchial margin: cannot be determined Parenchymal margin: 2 cm Other margins: abutting pleura  Relationship / distance of mass(es) to pleura: abutting with overlying fibrous adhesions  Extension of mass(es): overlying fibrous adhesion on pleura  Description of remainder of lung: crepitant gray  tan  Block summary: 1-first frozen section remnant (area A) 2-second frozen section remnant (area B) 3-6- area A 7-A with B 8-9-remaining B  All area A and B includes perpendicular parenchymal margin and pleura   B. Labeled: L 10 hilar node #1  Tissue frag                         ment(s): 1  Size: 1.0 x 0.6 x 0.3 cm  Description: gray fragment of tissue, bisected  Entirely submitted in one cassette(s).  C. Labeled: L 10 hilar node #2  Tissue fragment(s): 2  Size: 0.4 x 0.3 x 0.2 cm and 1.0 x 0.5 x 0.2 cm  Description: tan to gray fragments  Entirely submitted in one cassette(s).   D. Labeled: L5 subaortic node  Tissue fragment(s): 1  Size: 1.8 x 1.0 x 0.5 cm  Description: gray fragment of tissue with a small amount of attached fibrofatty tissue, bisected  Entirely submitted in 1 cassette(s).  E. Labeled: left upper lobe  Type of procedure: lobectomy  Laterality: left  Weight of specimen: 148 grams  Size of specimen: 16.5 x 11.3 x 3.0 cm  Other attached structures: no additional attached  Specimen integrity: intact  Orientation: pleura-green Parenchyma underlying 3.1 staple line hilum (presumed parenchymal margin)-orange Parenchyma underlying 8.5 cm staple line (presumed from previous wedge resection)-yellow  Num                         ber of masses: 1 mass-adjacent to previous resection and 6 pleural plaques  Size(s) of mass(es): mass-0.9 x 0.8 x 0.5 cm and pleural plaques ranging from 0.3-0.6 cm in greatest dimension  Location of mass(es): mass-0.2 cm from yellow inked margin  Description of mass(es): mass ill-defined firm gray, pleural plaques areas slightly firm tan  Relationship of mass(es) to bronchus: 5.5 cm from bronchial margin  Margins: Bronchial margin: 5.5 cm Parenchymal margin: 7.5 cm Other margins: 5.5 cm vascular margin  Relationship / distance of mass(es) to pleura: 1.5 cm  Extension of mass(es): extension not  identified  Description of remainder of lung: crepitant red-brown  Block summary: 1-en face bronchial margin 2-5 hilar lymph nodes candidates 3-en face vascular margins and 5 intraparenchymal lymph node candidates 4-en face presumed parenchymal margin (orange inked) 5-pleural plaque  adjacent to yellow ink 6-10-each containing 1 pleural plaque 11-13-entire mass adja                         cent to presumed previous wedge 14-15-representative uninvolved parenchyma   Final Diagnosis performed by Quay Burow, MD.  Electronically signed 04/20/2016 2:50:27PM    The electronic signature indicates that the named Attending Pathologist has evaluated the specimen  Technical component performed at [facility], [address], [place], [place] [address] Lab: [phone] Dir: Darrick Penna. Evette Doffing, MD  Professional component performed at [facility], [facility], [place], [place], [place] [address] Lab: [phone] Dir: Dellia Nims. Rubinas, MD    . Glucose-Capillary 04/17/2016 125* 65 - 99 mg/dL Final  . WBC 04/18/2016 10.9  3.6 - 11.0 K/uL Final  . RBC 04/18/2016 3.74* 3.80 - 5.20 MIL/uL Final  . Hemoglobin 04/18/2016 11.0* 12.0 - 16.0 g/dL Final  . HCT 04/18/2016 33.9* 35.0 - 47.0 % Final  . MCV 04/18/2016 90.8  80.0 - 100.0 fL Final  . MCH 04/18/2016 29.5  26.0 - 34.0 pg Final  . MCHC 04/18/2016 32.5  32.0 - 36.0 g/dL Final  . RDW 04/18/2016 12.4  11.5 - 14.5 % Final  . Platelets 04/18/2016 302  150 - 440 K/uL Final  . Sodium 04/18/2016 137  135 - 145 mmol/L Final  . Potassium 04/18/2016 3.9  3.5 - 5.1 mmol/L Final  . Chloride 04/18/2016 106  101 - 111 mmol/L Final  . CO2 04/18/2016 26  22 - 32 mmol/L Final  . Glucose, Bld 04/18/2016 121* 65 - 99 mg/dL Final  . BUN 04/18/2016 8  6 - 20 mg/dL Final  . Creatinine, Ser 04/18/2016 0.63  0.44 - 1.00 mg/dL Final  . Calcium 04/18/2016 8.6* 8.9 - 10.3 mg/dL Final  . Total Protein 04/18/2016 6.1* 6.5 - 8.1 g/dL Final  .  Albumin 04/18/2016 3.2* 3.5 - 5.0 g/dL Final  . AST 04/18/2016 22  15 - 41 U/L Final  . ALT 04/18/2016 16  14 - 54 U/L Final  . Alkaline Phosphatase 04/18/2016 49  38 - 126 U/L Final  . Total Bilirubin 04/18/2016 0.4  0.3 - 1.2 mg/dL Final  . GFR calc non Af Amer 04/18/2016 >60  >60 mL/min Final  . GFR calc Af Amer 04/18/2016 >60  >60 mL/min Final   Comment: (NOTE) The eGFR has been calculated using the CKD EPI equation. This calculation has not been validated in all clinical situations. eGFR's persistently <60 mL/min signify possible Chronic Kidney Disease.   . Anion gap 04/18/2016 5  5 - 15 Final  . WBC 04/19/2016 10.3  3.6 - 11.0 K/uL Final  . RBC 04/19/2016 3.47* 3.80 - 5.20 MIL/uL Final  . Hemoglobin 04/19/2016 10.6* 12.0 - 16.0 g/dL Final  . HCT 04/19/2016 31.3* 35.0 - 47.0 % Final  . MCV 04/19/2016 90.2  80.0 - 100.0 fL Final  . MCH 04/19/2016 30.6  26.0 - 34.0 pg Final  . MCHC 04/19/2016 33.9  32.0 - 36.0 g/dL Final  . RDW 04/19/2016 12.4  11.5 - 14.5 % Final  . Platelets 04/19/2016 274  150 - 440 K/uL Final  . Sodium 04/19/2016 138  135 - 145 mmol/L Final  . Potassium 04/19/2016 4.1  3.5 - 5.1 mmol/L Final  . Chloride 04/19/2016 104  101 - 111 mmol/L Final  . CO2 04/19/2016 30  22 - 32 mmol/L Final  . Glucose, Bld 04/19/2016 208* 65 - 99  mg/dL Final  . BUN 04/19/2016 10  6 - 20 mg/dL Final  . Creatinine, Ser 04/19/2016 0.68  0.44 - 1.00 mg/dL Final  . Calcium 04/19/2016 8.4* 8.9 - 10.3 mg/dL Final  . Total Protein 04/19/2016 5.9* 6.5 - 8.1 g/dL Final  . Albumin 04/19/2016 2.9* 3.5 - 5.0 g/dL Final  . AST 04/19/2016 20  15 - 41 U/L Final  . ALT 04/19/2016 16  14 - 54 U/L Final  . Alkaline Phosphatase 04/19/2016 45  38 - 126 U/L Final  . Total Bilirubin 04/19/2016 0.1* 0.3 - 1.2 mg/dL Final  . GFR calc non Af Amer 04/19/2016 >60  >60 mL/min Final  . GFR calc Af Amer 04/19/2016 >60  >60 mL/min Final   Comment: (NOTE) The eGFR has been calculated using the CKD EPI  equation. This calculation has not been validated in all clinical situations. eGFR's persistently <60 mL/min signify possible Chronic Kidney Disease.   . Anion gap 04/19/2016 4* 5 - 15 Final  . Amylase 04/19/2016 54  28 - 100 U/L Final    Assessment:  Airyana Sprunger is a 60 y.o. female with lupus x 8 years.  CT angiogram of the chest, abdominal and pelvis on 02/17/2016 revealed a 1.1 cm spiculated lesion in the left upper lobe increased from prior exam (08/30/2010). There were stable small pulmonary nodules bilaterally (2-3 mm).  She has a 15 pack year smoking history.  PET scan on 02/15/2016 revealed an irregular 1.0 cm apical LUL pulmonary nodule (SUV 0.7).  There was a 4 mm right middle lobe solid pulmonary nodule (stable since 08/02/2010) and two additional right lower lobe 3 mm pulmonary nodules.  PFTs revealed an FEV1 was 84% and her DLCO was 67%.  She has received the series of rabies injections after being bitten by a stray dog.  She has never had a colonoscopy.    She notes a history of 16 pound weight loss over an unclear period of time.  Her weight has been stable since 02/09/2016.  TSH was normal on 02/09/2016.  Symptomatically, she denies any new complaints.  Exam reveals no adenopathy or hepatosplenomegaly.  Plan: 1.  Discuss tumor board recommendations.  Discuss biopsy versus wedge resection.  Patient not interested in lobectomy.  Patient does not want lobe removed.  Discuss lesion concerning for malignancy, but unknown until biopsy or resection performed.  After some discussion, patient wishes to proceed with wedge resection scheduled for 04/17/2016. 2.  RTC on 04/28/2016 for MD assess after lung surgery (scheduled for 04/17/2016).  Addendum:  Request colonoscopy be scheduled.   Lequita Asal, MD  04/28/2016, 4:52 AM

## 2016-04-28 NOTE — Procedures (Signed)
CT LEFT chest tube placement 37F to pleurevac No complication No blood loss. See complete dictation in [facility].

## 2016-04-28 NOTE — Patient Instructions (Addendum)
Please try to take Mira lax 17 G daily to help you with your constipation.  Please give you a call if you have any questions or concerns.  Please go to the [facility] and let them know that you will need to be admitted to Room # 221.   I wish you well.

## 2016-04-29 NOTE — Progress Notes (Signed)
Left PTX s/p lobectomy Ct placed by IR good re-exapnsion Minimal output from CT no air leak   PE NAD Chest CTA, incisions healing well, staples in place. Lungs CTA. Ct in place no air leak  A/P keep ct suction cxr in am

## 2016-04-30 ENCOUNTER — Inpatient Hospital Stay: Payer: BLUE CROSS/BLUE SHIELD

## 2016-04-30 MED ORDER — GABAPENTIN 600 MG PO TABS
300.0000 mg | ORAL_TABLET | Freq: Three times a day (TID) | ORAL | Status: DC
  Administered 2016-04-30 – 2016-05-04 (×12): 300 mg via ORAL
  Filled 2016-04-30 (×3): qty 1
  Filled 2016-04-30: qty 2
  Filled 2016-04-30 (×9): qty 1

## 2016-04-30 MED ORDER — KETOROLAC TROMETHAMINE 15 MG/ML IJ SOLN
15.0000 mg | Freq: Four times a day (QID) | INTRAMUSCULAR | Status: DC
  Administered 2016-04-30 – 2016-05-01 (×4): 15 mg via INTRAVENOUS
  Filled 2016-04-30 (×4): qty 1

## 2016-04-30 NOTE — Progress Notes (Signed)
Left PTX, cxr lung up CT no airleak  AVSS PE NAD Chest CTA, incision healing well Ct in place  A/p keep ct on suction for now Add toradol and gabapentin

## 2016-05-01 ENCOUNTER — Inpatient Hospital Stay: Payer: BLUE CROSS/BLUE SHIELD

## 2016-05-01 MED ORDER — TRAMADOL HCL 50 MG PO TABS
100.0000 mg | ORAL_TABLET | Freq: Four times a day (QID) | ORAL | Status: DC
  Administered 2016-05-01 – 2016-05-04 (×10): 100 mg via ORAL
  Filled 2016-05-01 (×10): qty 2

## 2016-05-01 NOTE — Progress Notes (Signed)
Patient ID: Sheila Suarez, female   DOB: [birth year], 60 y.o.   MRN: [id number]  No chief complaint on file.   HPI [name] is a 60 y.o. female.  She was seen in clinic on 9/29 and a chest xray was obtained that showed a 15% pneumothorax.  After discussion with our interventional radiologist, she was admitted following chest tube insertion.  She denied any fever or cough.     Past Medical History:  Diagnosis Date  . Cancer ([place])    Lung  . Coronary artery disease   . Lupus   . Shortness of breath dyspnea     Past Surgical History:  Procedure Laterality Date  . CYST EXCISION Left    hand  . FLEXIBLE BRONCHOSCOPY N/A 04/17/2016   Procedure: FLEXIBLE BRONCHOSCOPY;  Surgeon: [name], MD;  Location: [facility] ORS;  Service: Thoracic;  Laterality: N/A;  . THORACOTOMY Left 04/17/2016   Procedure: THORACOTOMY LEFT UPPER LOBE LUNG RESECTION;  Surgeon: [name], MD;  Location: [facility] ORS;  Service: Thoracic;  Laterality: Left;  . TUBAL LIGATION Bilateral    37 years ago    Family History  Problem Relation Age of Onset  . Pancreatic cancer Sister   . Lung cancer Sister   . Lung cancer Brother   . Stomach cancer Paternal Grandmother   . Heart disease Father   . Cancer Daughter 40    cervical  . Cancer Maternal Aunt     brain  . Cancer Maternal Uncle     prostate  . Cancer Maternal Aunt     bone    Social History Social History  Substance Use Topics  . Smoking status: Former Smoker    Packs/day: 0.25    Years: 35.00    Types: Cigarettes    Quit date: 03/19/2016  . Smokeless tobacco: Never Used  . Alcohol use 0.6 oz/week    1 Cans of beer per week     Comment: Rarely    No Known Allergies  Current Facility-Administered Medications  Medication Dose Route Frequency Provider Last Rate Last Dose  . 0.9 %  sodium chloride infusion   Intravenous Continuous Arne Cleveland, MD 10 mL/hr at 04/28/16 1303    . gabapentin (NEURONTIN) tablet 300 mg  300 mg Oral TID  Jules Husbands, MD   300 mg at 04/30/16 1948  . ketorolac (TORADOL) 15 MG/ML injection 15 mg  15 mg Intravenous Q6H Diego Sarita Haver, MD   15 mg at 05/01/16 0539  . oxyCODONE-acetaminophen (PERCOCET/ROXICET) 5-325 MG per tablet 1-2 tablet  1-2 tablet Oral Q4H PRN [name], MD   2 tablet at 05/01/16 0539  . traMADol (ULTRAM) tablet 50-100 mg  50-100 mg Oral Q6H PRN [name], MD   50 mg at 04/29/16 1947    Location, Quality, Duration, Severity, Timing, Context, Modifying Factors, Associated Signs and Symptoms.  Review of Systems A complete review of systems was asked and was negative except for the following positive findings postoperative musculoskeletal pain  Blood pressure 116/61, pulse 66, temperature 98.5 F (36.9 C), temperature source Oral, resp. rate (!) 22, height '5\' 5"'$  (1.651 m), weight 112 lb (50.8 kg), SpO2 100 %.  Physical Exam CONSTITUTIONAL:  Pleasant, well-developed, well-nourished, and in no acute distress. EYES: Pupils equal and reactive to light, Sclera non-icteric EARS, NOSE, MOUTH AND THROAT:  The oropharynx was clear.  Oral mucosa pink and moist. LYMPH NODES:  Lymph nodes in the neck and axillae were normal RESPIRATORY:  Lungs were  clear on the right and decreased on the left.  Normal respiratory effort without pathologic use of accessory muscles of respiration CARDIOVASCULAR: Heart was regular without murmurs.  There were no carotid bruits. GI: The abdomen was soft, nontender, and nondistended. There were no palpable masses. There was no hepatosplenomegaly. There were normal bowel sounds in all quadrants. GU:   MUSCULOSKELETAL:  Normal muscle strength and tone.  No clubbing or cyanosis.   SKIN:  There were no pathologic skin lesions.  There were no nodules on palpation.  Wounds are clean and dry NEUROLOGIC:  Sensation is normal.  Cranial nerves are grossly intact. PSYCH:  Oriented to person, place and time.  Mood and affect are normal.  Data Reviewed Chest  xray  I have personally reviewed the patient's imaging and medical records.    Assessment    Pneumothorax after LUL resection    Plan    Admit to hospital after chest tube insertion       [name], MD 05/01/2016, 8:17 AM

## 2016-05-01 NOTE — Progress Notes (Signed)
Still having some discomfort at chest tube site  VSS, Afebrile  No air leak seen Increased suction to 40 and did get a small amount of air  Sutures removed.  Wounds clean and dry Lungs clear but decreased on the left  Will repeat CXRay now on 40 cm of water suction

## 2016-05-02 ENCOUNTER — Inpatient Hospital Stay: Payer: BLUE CROSS/BLUE SHIELD

## 2016-05-02 MED ORDER — MAGNESIUM HYDROXIDE 400 MG/5ML PO SUSP
30.0000 mL | Freq: Two times a day (BID) | ORAL | Status: DC | PRN
  Administered 2016-05-02: 30 mL via ORAL
  Filled 2016-05-02: qty 30

## 2016-05-02 NOTE — Plan of Care (Signed)
Problem: Pain Managment: Goal: General experience of comfort will improve Outcome: Progressing PO pain meds effective for c/o CT site pain.

## 2016-05-02 NOTE — Progress Notes (Signed)
Did well overnight but feels that chest tube is pulling on skin.  VSS, Afeb Lungs clear Heart reg Wounds clean and dry  Chest tube without tension, erythema or swelling  No air leak  CXRay from today looks good - no pneumothorax on water seal  Will leave on water seal today and repeat CXRay in the morning.  Anticipate tube removal tomorrow She would like an enema for her bowels.

## 2016-05-03 ENCOUNTER — Inpatient Hospital Stay: Payer: BLUE CROSS/BLUE SHIELD

## 2016-05-03 MED ORDER — OXYCODONE-ACETAMINOPHEN 5-325 MG PO TABS: 1.0000 | ORAL_TABLET | ORAL | 0 refills | Status: DC | PRN

## 2016-05-03 MED ORDER — GABAPENTIN 600 MG PO TABS: 300.0000 mg | ORAL_TABLET | Freq: Three times a day (TID) | ORAL | 0 refills | Status: DC

## 2016-05-03 MED ORDER — TRAMADOL HCL 50 MG PO TABS: 100.0000 mg | ORAL_TABLET | Freq: Four times a day (QID) | ORAL | 0 refills | Status: DC

## 2016-05-03 NOTE — Progress Notes (Signed)
Quiet night.  Hungry.  Walked several times.  Wounds clean and dry No air leak CXRay from this morning without pneumothorax  CT removed.  Will check CXRay.  Discharge instructions given if CXRay looks OK.  Will see in one week

## 2016-05-03 NOTE — Care Management (Signed)
Patient admitted with pneumothorax.  Patient previously discharged from [facility].  Patient PCP [facility].  Lives at home with daughter.  [facility] for transportation. Patient was provided walker and BSC at last admission.  [facility] open with [facility] for PT.  Patient to discharge home today.  Resumption of home care orders placed.  Corene Cornea with [facility] notified.  RNCM signing off.

## 2016-05-03 NOTE — Discharge Instructions (Signed)
Leave dressing in place for 48 hours and then remove and wash over chest tube site with soap and water.  Cover with BandAid.  Call Dr. Genevive Bi office and make appointment for one week.  Will need a chest xray prior to visit.

## 2016-05-03 NOTE — Discharge Summary (Signed)
Physician Discharge Summary  Patient ID: Sheila Suarez MRN: [id number] DOB/AGE: [birth year] 59 y.o.  Admit date: 04/28/2016 Discharge date: 05/03/2016   Discharge Diagnoses:  Active Problems:   Pneumothorax   Procedures:insertion of percutaneous drain left chest  Hospital Course: Patient admitted from clinic with a moderate size left pneumothorax.  Percutaneous drain placed with prompt reexpansion of lung.  After several days, there was no air leak and chest tube removed without incident on the day of discharge.  Disposition: 06-Home-Health Care Svc  Discharge Instructions    Call MD for:  redness, tenderness, or signs of infection (pain, swelling, redness, odor or green/yellow discharge around incision site)    Complete by:  As directed    Diet - low sodium heart healthy    Complete by:  As directed    Increase activity slowly    Complete by:  As directed        Medication List    STOP taking these medications   oxyCODONE 5 MG immediate release tablet Commonly known as:  Oxy IR/ROXICODONE     TAKE these medications   gabapentin 600 MG tablet Commonly known as:  NEURONTIN Take 0.5 tablets (300 mg total) by mouth 3 (three) times daily.   oxyCODONE-acetaminophen 5-325 MG tablet Commonly known as:  PERCOCET/ROXICET Take 1-2 tablets by mouth every 4 (four) hours as needed for moderate pain.   traMADol 50 MG tablet Commonly known as:  ULTRAM Take 2 tablets (100 mg total) by mouth every 6 (six) hours. What changed:  how much to take        [name], MD

## 2016-05-04 ENCOUNTER — Inpatient Hospital Stay: Payer: BLUE CROSS/BLUE SHIELD

## 2016-05-04 NOTE — Discharge Summary (Signed)
Physician Discharge Summary  Patient ID: Sheila Suarez MRN: [id number] DOB/AGE: Jun 12, 1956 60 y.o.  Admit date: 04/28/2016 Discharge date: 05/04/2016   Discharge Diagnoses:  Active Problems:   Pneumothorax   Procedures:insertion of left chest tube  Hospital Course: Admitted after seen in clinic with shortness of breath and left pneumothorax. Treated with pigtail drain.  Chest tube removed and CXRay showed a very small pneumothorax and watched another 24 hours and repeat CXray showed no change.  Discharged to home.    Disposition: 06-Home-Health Care Svc  Discharge Instructions    Call MD for:  redness, tenderness, or signs of infection (pain, swelling, redness, odor or green/yellow discharge around incision site)    Complete by:  As directed    Diet - low sodium heart healthy    Complete by:  As directed    Increase activity slowly    Complete by:  As directed        Medication List    STOP taking these medications   oxyCODONE 5 MG immediate release tablet Commonly known as:  Oxy IR/ROXICODONE     TAKE these medications   gabapentin 600 MG tablet Commonly known as:  NEURONTIN Take 0.5 tablets (300 mg total) by mouth 3 (three) times daily.   oxyCODONE-acetaminophen 5-325 MG tablet Commonly known as:  PERCOCET/ROXICET Take 1-2 tablets by mouth every 4 (four) hours as needed for moderate pain.   traMADol 50 MG tablet Commonly known as:  ULTRAM Take 2 tablets (100 mg total) by mouth every 6 (six) hours. What changed:  how much to take        [name], MD

## 2016-05-04 NOTE — Progress Notes (Signed)
Patient discharge teaching given, including activity, diet, follow-up appoints, and medications. Patient verbalized understanding of all discharge instructions. IV access was d/c'd. Vitals are stable. Skin is intact except as charted in most recent assessments. Pt to be escorted out by sister, to be driven home by sister.  Angus Seller

## 2016-05-04 NOTE — Progress Notes (Signed)
Had a bowel movement.  Not short of breath. Wounds clean and dry Lungs decreased slighytly on the left  CXRay is unchanged from yesterday  Will discharge to home.  Followup in one week.

## 2016-05-05 ENCOUNTER — Encounter: Payer: Self-pay | Admitting: Emergency Medicine

## 2016-05-05 ENCOUNTER — Other Ambulatory Visit: Payer: Self-pay

## 2016-05-05 ENCOUNTER — Telehealth: Payer: Self-pay | Admitting: Cardiothoracic Surgery

## 2016-05-05 ENCOUNTER — Emergency Department
Admission: EM | Admit: 2016-05-05 | Discharge: 2016-05-05 | Disposition: A | Discharge status: Home / Self Care | Payer: BLUE CROSS/BLUE SHIELD | Attending: Student in an Organized Health Care Education/Training Program | Admitting: Student in an Organized Health Care Education/Training Program

## 2016-05-05 ENCOUNTER — Emergency Department: Payer: BLUE CROSS/BLUE SHIELD

## 2016-05-05 ENCOUNTER — Encounter: Payer: Self-pay | Admitting: Cardiothoracic Surgery

## 2016-05-05 DIAGNOSIS — Z859 Personal history of malignant neoplasm, unspecified: Secondary | ICD-10-CM | POA: Diagnosis not present

## 2016-05-05 DIAGNOSIS — T819XXA Unspecified complication of procedure, initial encounter: Secondary | ICD-10-CM | POA: Insufficient documentation

## 2016-05-05 DIAGNOSIS — Z87891 Personal history of nicotine dependence: Secondary | ICD-10-CM | POA: Insufficient documentation

## 2016-05-05 DIAGNOSIS — Y69 Unspecified misadventure during surgical and medical care: Secondary | ICD-10-CM | POA: Insufficient documentation

## 2016-05-05 DIAGNOSIS — Z5321 Procedure and treatment not carried out due to patient leaving prior to being seen by health care provider: Secondary | ICD-10-CM | POA: Diagnosis not present

## 2016-05-05 DIAGNOSIS — R0602 Shortness of breath: Secondary | ICD-10-CM | POA: Diagnosis present

## 2016-05-05 DIAGNOSIS — I2510 Atherosclerotic heart disease of native coronary artery without angina pectoris: Secondary | ICD-10-CM | POA: Diagnosis not present

## 2016-05-05 NOTE — ED Notes (Signed)
Dr Edd Fabian spoken with about pt's x-ray, no new readings noted at this time

## 2016-05-05 NOTE — Telephone Encounter (Signed)
Spoke with patient at this time. She states that the nurse came to her home and expressed that she could not really assess her lungs completely. Patient is still experiencing chest pains, chills, and hot flashes. Has not taken her temperature. Denies difficulty breathing and denies worsening pain during inspiration. But patient states "I feel like something is wrong and I don't want my lung to collapse."  Patient was sent to the Emergency Room and Dr. Azalee Course will be notified.

## 2016-05-05 NOTE — ED Triage Notes (Signed)
Pt presents to ED c/o SOB and pain when she takes deep breaths related to recent lung surgery on the 04/17/2016 in which she was discharged  yesterday due to lung collapse. Pt still with pain , alert and able to tolerate interview. Said she was supposed to meet Dr. Heath Lark per PCP nurse.

## 2016-05-05 NOTE — ED Notes (Signed)
Pt adamantly refuses blood work/needle, states " I want the xray; I am not here for needles".

## 2016-05-05 NOTE — Telephone Encounter (Signed)
Patient has called and complains of heavy chest pain with some chills with hot flashes. No fever, little nausea.  Patient was discharged on 05/04/16. Chest tube was removed and the [place] showed very small pneumothorax.   She states that the home health nurse is to arrive around 11:00am this morning and if she continues with this pain she will go to the emergency room.   Please call patient with any further directions. [phone].

## 2016-05-08 ENCOUNTER — Telehealth: Payer: Self-pay | Admitting: Emergency Medicine

## 2016-05-08 NOTE — Telephone Encounter (Signed)
Called patient due to lwot to inquire about condition and follow up plans. Pt says she still is in a lot of pain and feeling bad.  She says she has appt with dr Genevive Bi on Friday.  I asked her to call the office today to notify that she came in but wasn't seen, but that an xray was done.  She agrees to call now.

## 2016-05-09 ENCOUNTER — Telehealth: Payer: Self-pay

## 2016-05-09 NOTE — Telephone Encounter (Signed)
Erin Ruppe (Physical Therapist with [facility]) called in behalf of patient [name]. She is having significant pain on her left side where her lung was collapsed. When she listened to her lungs she could hear something different in the middle lobe of her left lung. The Gabapentin medication is making her extremely nauseous and is causing her to throw up. The  patient states that she took a gabapentin tablet in the hospital as opposed to the capsule and it did not make her throw up.

## 2016-05-10 ENCOUNTER — Emergency Department: Payer: BLUE CROSS/BLUE SHIELD

## 2016-05-10 ENCOUNTER — Encounter: Payer: Self-pay | Admitting: Emergency Medicine

## 2016-05-10 ENCOUNTER — Emergency Department
Admission: EM | Admit: 2016-05-10 | Discharge: 2016-05-10 | Disposition: A | Discharge status: Home / Self Care | Payer: BLUE CROSS/BLUE SHIELD | Attending: Emergency Medicine | Admitting: Emergency Medicine

## 2016-05-10 ENCOUNTER — Other Ambulatory Visit: Payer: Self-pay

## 2016-05-10 DIAGNOSIS — Z79899 Other long term (current) drug therapy: Secondary | ICD-10-CM | POA: Diagnosis not present

## 2016-05-10 DIAGNOSIS — Z85118 Personal history of other malignant neoplasm of bronchus and lung: Secondary | ICD-10-CM | POA: Diagnosis not present

## 2016-05-10 DIAGNOSIS — Z87891 Personal history of nicotine dependence: Secondary | ICD-10-CM | POA: Diagnosis not present

## 2016-05-10 DIAGNOSIS — R0602 Shortness of breath: Secondary | ICD-10-CM | POA: Insufficient documentation

## 2016-05-10 DIAGNOSIS — R079 Chest pain, unspecified: Secondary | ICD-10-CM | POA: Diagnosis present

## 2016-05-10 DIAGNOSIS — I2510 Atherosclerotic heart disease of native coronary artery without angina pectoris: Secondary | ICD-10-CM | POA: Insufficient documentation

## 2016-05-10 DIAGNOSIS — R0789 Other chest pain: Secondary | ICD-10-CM | POA: Insufficient documentation

## 2016-05-10 DIAGNOSIS — R918 Other nonspecific abnormal finding of lung field: Secondary | ICD-10-CM

## 2016-05-10 LAB — CBC
HCT: 40.9 % (ref 35.0–47.0)
Hemoglobin: 13.2 g/dL (ref 12.0–16.0)
MCH: 29.4 pg (ref 26.0–34.0)
MCHC: 32.3 g/dL (ref 32.0–36.0)
MCV: 91.2 fL (ref 80.0–100.0)
PLATELETS: 471 10*3/uL — AB (ref 150–440)
RBC: 4.48 MIL/uL (ref 3.80–5.20)
RDW: 12.9 % (ref 11.5–14.5)
WBC: 6.9 10*3/uL (ref 3.6–11.0)

## 2016-05-10 LAB — BASIC METABOLIC PANEL
Anion gap: 8 (ref 5–15)
BUN: 13 mg/dL (ref 6–20)
CHLORIDE: 106 mmol/L (ref 101–111)
CO2: 24 mmol/L (ref 22–32)
CREATININE: 0.79 mg/dL (ref 0.44–1.00)
Calcium: 9.6 mg/dL (ref 8.9–10.3)
GFR calc Af Amer: >60 mL/min (ref >60)
GFR calc non Af Amer: >60 mL/min (ref >60)
Glucose, Bld: 138 mg/dL — ABNORMAL HIGH (ref 65–99)
Potassium: 3.7 mmol/L (ref 3.5–5.1)
SODIUM: 138 mmol/L (ref 135–145)

## 2016-05-10 LAB — TROPONIN I: Troponin I: <0.03 ng/mL (ref <0.03)

## 2016-05-10 MED ORDER — SUCRALFATE 1 G PO TABS: 1.0000 g | ORAL_TABLET | Freq: Four times a day (QID) | ORAL | 0 refills | Status: DC

## 2016-05-10 MED ORDER — RANITIDINE HCL 150 MG PO TABS: 150.0000 mg | ORAL_TABLET | Freq: Two times a day (BID) | ORAL | 1 refills | Status: DC

## 2016-05-10 MED ORDER — ONDANSETRON HCL 4 MG PO TABS: 4.0000 mg | ORAL_TABLET | Freq: Three times a day (TID) | ORAL | 0 refills | Status: DC | PRN

## 2016-05-10 MED ORDER — GI COCKTAIL ~~LOC~~
ORAL | Status: AC
  Administered 2016-05-10: 30 mL via ORAL
  Filled 2016-05-10: qty 30

## 2016-05-10 MED ORDER — GI COCKTAIL ~~LOC~~
30.0000 mL | Freq: Once | ORAL | Status: AC
  Administered 2016-05-10: 30 mL via ORAL

## 2016-05-10 MED ORDER — LIDOCAINE 5 % EX PTCH: 1.0000 | MEDICATED_PATCH | Freq: Two times a day (BID) | CUTANEOUS | 0 refills | Status: DC

## 2016-05-10 NOTE — Telephone Encounter (Signed)
Arabi called and stated that she has been waiting for 2 days for a call back.

## 2016-05-10 NOTE — Telephone Encounter (Signed)
Error

## 2016-05-10 NOTE — ED Triage Notes (Signed)
Pt comes into the ED via EMS from home c/o left sided chest pain that radiates into her back.  Pain started two days ago.  Patient states it causes shortness of breath and nausea.  Patient admitted to the hospital last week and had a chest tube place.  Chest tube removed last Thursday.  Patient states pain gets worse with movement.  Patient also has history of upper left lobectomy from left lung cancer. 133/86, 98% room air, 324 asp given in route.

## 2016-05-10 NOTE — Telephone Encounter (Signed)
Called patient back and she stated that she was at the Emergency Room. I told her that I was calling her in reference to her questions but I let her go since she stated that they were asking her questions.   I then called Dr. Genevive Bi to let him know that she was at the Emergency Room.

## 2016-05-10 NOTE — ED Provider Notes (Signed)
[facility] Emergency Department Provider Note   ____________________________________________   I have reviewed the triage vital signs and the nursing notes.   HISTORY  Chief Complaint Chest Pain and Shortness of Breath   History limited by: Not Limited   HPI Sheila Suarez is a 60 y.o. female who presents to the emergency department today because of concerns for chest pain.The patient states that she has had chest pain since she was discharged from the hospital 6 days ago. She had a long stay in the hospital secondary to a pneumothorax on the left side. She did have chest tubes placed. She states that since leaving the hospital she is continuing to have pain on the left side. For the past 2 days however the pain has become more severe. She describes it as being in the center chest and radiating around her left breast. She thinks she might be slightly more short of breath as well. Furthermore she states she has been feeling hot and cold although does not sound like she has had any measured fevers. She denies any significant cough.    Past Medical History:  Diagnosis Date  . Cancer ([place])    Lung  . Coronary artery disease   . Lupus   . Shortness of breath dyspnea     Patient Active Problem List   Diagnosis Date Noted  . Pneumothorax 04/28/2016  . Lung mass 04/17/2016  . Bradycardia 03/20/2016  . CAD (coronary artery disease) 02/28/2016  . Shortness of breath 02/28/2016  . Lung cancer ([place]) 02/21/2016  . Lupus anticoagulant positive 02/21/2016  . Weight loss 02/09/2016  . Nodule of left lung 02/07/2016    Past Surgical History:  Procedure Laterality Date  . CYST EXCISION Left    hand  . FLEXIBLE BRONCHOSCOPY N/A 04/17/2016   Procedure: FLEXIBLE BRONCHOSCOPY;  Surgeon: [name], MD;  Location: [facility] ORS;  Service: Thoracic;  Laterality: N/A;  . THORACOTOMY Left 04/17/2016   Procedure: THORACOTOMY LEFT UPPER LOBE LUNG RESECTION;  Surgeon:  [name], MD;  Location: [facility] ORS;  Service: Thoracic;  Laterality: Left;  . TUBAL LIGATION Bilateral    37 years ago    Prior to Admission medications   Medication Sig Start Date End Date Taking? Authorizing Provider  gabapentin (NEURONTIN) 600 MG tablet Take 0.5 tablets (300 mg total) by mouth 3 (three) times daily. 05/03/16   [name], MD  oxyCODONE-acetaminophen (PERCOCET/ROXICET) 5-325 MG tablet Take 1-2 tablets by mouth every 4 (four) hours as needed for moderate pain. 05/03/16   [name], MD  traMADol (ULTRAM) 50 MG tablet Take 2 tablets (100 mg total) by mouth every 6 (six) hours. 05/03/16   [name], MD    Allergies Review of patient's allergies indicates no known allergies.  Family History  Problem Relation Age of Onset  . Pancreatic cancer Sister   . Lung cancer Sister   . Lung cancer Brother   . Stomach cancer Paternal Grandmother   . Heart disease Father   . Cancer Daughter 62    cervical  . Cancer Maternal Aunt     brain  . Cancer Maternal Uncle     prostate  . Cancer Maternal Aunt     bone    Social History Social History  Substance Use Topics  . Smoking status: Former Smoker    Packs/day: 0.25    Years: 35.00    Types: Cigarettes    Quit date: 03/19/2016  . Smokeless tobacco: Never Used  . Alcohol  use 0.6 oz/week    1 Cans of beer per week     Comment: Rarely    Review of Systems  Constitutional: Positive for hot and cold flashes Cardiovascular: Positive for chest pain. Respiratory: Positive for shortness of breath. Gastrointestinal: Negative for abdominal pain, vomiting and diarrhea. Genitourinary: Negative for dysuria. Musculoskeletal: Positive for left chest wall pain at the site of her chest tube Skin: Negative for rash. Neurological: Negative for headaches, focal weakness or numbness.  10-point ROS otherwise negative.  ____________________________________________   PHYSICAL EXAM:  VITAL SIGNS: ED Triage Vitals  Enc  Vitals Group     BP 148/75     Pulse 84     Resp 22     Temp 98.2     Temp src      SpO2 100   Constitutional: Alert and oriented. Appears slightly uncomfortable Eyes: Conjunctivae are normal. Normal extraocular movements. ENT   Head: Normocephalic and atraumatic.   Nose: No congestion/rhinnorhea.   Mouth/Throat: Mucous membranes are moist.   Neck: No stridor. Hematological/Lymphatic/Immunilogical: No cervical lymphadenopathy. Cardiovascular: Normal rate, regular rhythm.  No murmurs, rubs, or gallops. Respiratory: Normal respiratory effort without tachypnea nor retractions. Breath sounds are clear and equal bilaterally. No wheezes/rales/rhonchi. Gastrointestinal: Soft and nontender. No distention.  Genitourinary: Deferred Musculoskeletal: Normal range of motion in all extremities. No lower extremity edema. Neurologic:  Normal speech and language. No gross focal neurologic deficits are appreciated.  Skin:  Skin is warm, dry and intact. No rash noted. Psychiatric: Mood and affect are normal. Speech and behavior are normal. Patient exhibits appropriate insight and judgment.  ____________________________________________    LABS (pertinent positives/negatives)  Labs Reviewed  BASIC METABOLIC PANEL - Abnormal; Notable for the following:       Result Value   Glucose, Bld 138 (*)    All other components within normal limits  CBC - Abnormal; Notable for the following:    Platelets 471 (*)    All other components within normal limits  TROPONIN I     ____________________________________________   EKG  I, Nance Pear, attending physician, personally viewed and interpreted this EKG  EKG Time: 1530 Rate: 81 Rhythm: normal sinus rhythm Axis: normal Intervals: qtc 422 QRS: narrow ST changes: no st elevation Impression: normal ekg   ____________________________________________    RADIOLOGY  CXR   IMPRESSION:  Postop changes. No active cardiopulmonary  disease.    Loculated left hydro pneumothorax has resolved.   ____________________________________________   PROCEDURES  Procedures  ____________________________________________   INITIAL IMPRESSION / ASSESSMENT AND PLAN / ED COURSE  Pertinent labs & imaging results that were available during my care of the patient were reviewed by me and considered in my medical decision making (see chart for details).  Patient with continued chest pain after chest tube. X-ray and blood work without any concerning findings. The patient had been prescribed pain medication by discharging physician. Will add on lidocaine patch.  ____________________________________________   FINAL CLINICAL IMPRESSION(S) / ED DIAGNOSES  Final diagnoses:  Chest wall pain     Note: This dictation was prepared with Dragon dictation. Any transcriptional errors that result from this process are unintentional    Nance Pear, MD 05/10/16 [phone]

## 2016-05-10 NOTE — Discharge Instructions (Signed)
Please seek medical attention for any high fevers, chest pain, shortness of breath, change in behavior, persistent vomiting, bloody stool or any other new or concerning symptoms.  

## 2016-05-12 ENCOUNTER — Encounter: Payer: Self-pay | Admitting: Cardiothoracic Surgery

## 2016-05-12 ENCOUNTER — Ambulatory Visit (INDEPENDENT_AMBULATORY_CARE_PROVIDER_SITE_OTHER): Payer: BLUE CROSS/BLUE SHIELD | Admitting: Cardiothoracic Surgery

## 2016-05-12 VITALS — BP 121/80 | HR 80 | Temp 98.0°F | Resp 22 | Ht 65.0 in | Wt 111.8 lb

## 2016-05-12 DIAGNOSIS — J9383 Other pneumothorax: Secondary | ICD-10-CM

## 2016-05-12 DIAGNOSIS — R918 Other nonspecific abnormal finding of lung field: Secondary | ICD-10-CM

## 2016-05-12 NOTE — Addendum Note (Signed)
Addended by: Celene Kras on: 05/12/2016 11:45 AM   Modules accepted: Orders

## 2016-05-12 NOTE — Progress Notes (Signed)
  Patient ID: Sheila Suarez, female   DOB: 1956/07/26, 60 y.o.   MRN: [id number]  HISTORY: She returns today in follow-up. She was recently seen in our emergency department for complaints of abdominal pain and chest wall pain. She was given prescriptions for Carafate and ranitidine. She was also given a prescription for Zofran. She doesn't feel that these medications have particularly helped. She continues to have some left upper quadrant pain as well as some tenderness under her left breast. She states she's only had 1 small bowel movement which was laxative induced. Her appetite is only been fair.   Vitals:   05/12/16 0853  BP: 121/80  Pulse: 80  Resp: (!) 22  Temp: 98 F (36.7 C)     EXAM:    Resp: Lungs are clear bilaterally But diminished on the left..  No respiratory distress, normal effort. Heart:  Regular without murmurs Abd:  Abdomen is soft, non distended and non tender. No masses are palpable.  There is no rebound and no guarding.  Neurological: Alert and oriented to person, place, and time. Coordination normal.  Skin: Skin is warm and dry. No rash noted. No diaphoretic. No erythema. No pallor.  Psychiatric: Normal mood and affect. Normal behavior. Judgment and thought content normal.   She has normal bowel sounds in all quadrants. There is no tenderness. Her thoracotomy wound is well healed. Her chest tube site is also healed.   ASSESSMENT: I have independently reviewed the patient's chest x-ray from the emergency department. That shows the postoperative changes only. I see no acute problems on it.   PLAN:   I did review with her her medications. She states that she's currently taking 3 Percocet tablets every 4 hours. I explained her that this seemed a bit excessive and I also reviewed with her her other medications. It appears that she was taking the Carafate as needed for nausea and not the Zofran. We again reviewed all those. I told her I might see her back in 2  weeks with another chest x-ray. I also told her we will try to move up her appointment with our gastroenterologist. I also explained to her that most patients require 3 months till they're completely healed and are able to return to work. All her questions were answered. I'll see her back again in 2 weeks.    [name], MD

## 2016-05-12 NOTE — Patient Instructions (Signed)
We have provided you a note per your request for your disability. We have an appointment with DR.Wohl on 05/22/16 @ 1:00 pm. Your colonoscopy is scheduled for 06/20/16 with Dr.Wohl. Please see your follow up appointment listed below with Dr.Oaks. We need for you to have your Chest xray prior to coming up to see Dr.Oaks.

## 2016-05-19 ENCOUNTER — Other Ambulatory Visit: Payer: Self-pay

## 2016-05-22 ENCOUNTER — Encounter: Payer: Self-pay | Admitting: Gastroenterology

## 2016-05-22 ENCOUNTER — Other Ambulatory Visit: Payer: Self-pay

## 2016-05-22 ENCOUNTER — Ambulatory Visit (INDEPENDENT_AMBULATORY_CARE_PROVIDER_SITE_OTHER): Payer: BLUE CROSS/BLUE SHIELD | Admitting: Gastroenterology

## 2016-05-22 ENCOUNTER — Ambulatory Visit: Payer: BLUE CROSS/BLUE SHIELD | Admitting: Hematology and Oncology

## 2016-05-22 VITALS — BP 128/69 | HR 76 | Temp 98.3°F | Ht 65.0 in | Wt 117.0 lb

## 2016-05-22 DIAGNOSIS — K5900 Constipation, unspecified: Secondary | ICD-10-CM | POA: Diagnosis not present

## 2016-05-22 DIAGNOSIS — R1012 Left upper quadrant pain: Secondary | ICD-10-CM | POA: Diagnosis not present

## 2016-05-22 NOTE — Progress Notes (Signed)
Gastroenterology Consultation  Referring Provider:     [facility] Physician:  [facility] Primary Gastroenterologist:  Dr. Allen Norris     Reason for Consultation:     Chest and abdominal pain        HPI:   Sheila Suarez is a 60 y.o. y/o female referred for consultation & management of Chest and abdominal pain by Dr. [facility].  This patient comes in today after having significant chest pain after having a lobectomy for a lung lesion. The patient states that her pain started right after she had the surgery. The patient has never had a colonoscopy and she is 60 years old. The patient states she moves her bowels probably once a week to once every other week. The patient denies any unexplained weight loss fevers chills nausea or vomiting. She also denies any black stools or bloody stools. The patient was seen by Dr. Genevive Bi who recommended that she see me because of the feeling she has in the left upper quadrant of gas pains. She states that the pain is worse with moving around. There is no exacerbation or relieving of the pain with eating or moving her bowels.  Past Medical History:  Diagnosis Date  . Cancer ([place])    Lung  . Coronary artery disease   . Lupus   . Shortness of breath dyspnea     Past Surgical History:  Procedure Laterality Date  . CYST EXCISION Left    hand  . FLEXIBLE BRONCHOSCOPY N/A 04/17/2016   Procedure: FLEXIBLE BRONCHOSCOPY;  Surgeon: [name], MD;  Location: [facility] ORS;  Service: Thoracic;  Laterality: N/A;  . THORACOTOMY Left 04/17/2016   Procedure: THORACOTOMY LEFT UPPER LOBE LUNG RESECTION;  Surgeon: [name], MD;  Location: [facility] ORS;  Service: Thoracic;  Laterality: Left;  . TUBAL LIGATION Bilateral    37 years ago    Prior to Admission medications   Medication Sig Start Date End Date Taking? Authorizing Provider  gabapentin (NEURONTIN) 300 MG capsule Take 300 mg by mouth 3 (three) times daily. 05/04/16   Yes Historical Provider, MD  ondansetron (ZOFRAN) 4 MG tablet Take 1 tablet (4 mg total) by mouth every 8 (eight) hours as needed. 05/10/16  Yes Nance Pear, MD  oxyCODONE (OXY IR/ROXICODONE) 5 MG immediate release tablet TAKE 1 TO 2 TABLETS BY MOUTH EVERY 4 HOURS AS NEEDED FOR SEVERE PAIN 05/04/16  Yes Historical Provider, MD  oxyCODONE-acetaminophen (PERCOCET/ROXICET) 5-325 MG tablet Take 1-2 tablets by mouth every 4 (four) hours as needed for moderate pain. 05/03/16  Yes [name], MD  ranitidine (ZANTAC) 150 MG tablet Take 1 tablet (150 mg total) by mouth 2 (two) times daily. 05/10/16 05/10/17 Yes Nance Pear, MD  sucralfate (CARAFATE) 1 g tablet Take 1 tablet (1 g total) by mouth 4 (four) times daily. 05/10/16  Yes Nance Pear, MD  traMADol (ULTRAM) 50 MG tablet Take 2 tablets (100 mg total) by mouth every 6 (six) hours. 05/03/16  Yes [name], MD  gabapentin (NEURONTIN) 600 MG tablet Take 0.5 tablets (300 mg total) by mouth 3 (three) times daily. Patient not taking: Reported on 05/22/2016 05/03/16   [name], MD  lidocaine (LIDODERM) 5 % Place 1 patch onto the skin every 12 (twelve) hours. Remove & Discard patch within 12 hours or as directed by MD Patient not taking: Reported on 05/22/2016 05/10/16 05/10/17  Nance Pear, MD    Family History  Problem Relation Age of Onset  .  Pancreatic cancer Sister   . Lung cancer Sister   . Lung cancer Brother   . Stomach cancer Paternal Grandmother   . Heart disease Father   . Cancer Daughter 65    cervical  . Cancer Maternal Aunt     brain  . Cancer Maternal Uncle     prostate  . Cancer Maternal Aunt     bone     Social History  Substance Use Topics  . Smoking status: Former Smoker    Packs/day: 0.25    Years: 35.00    Types: Cigarettes    Quit date: 03/19/2016  . Smokeless tobacco: Former Systems developer    Quit date: 03/16/2016  . Alcohol use No     Comment: Rarely    Allergies as of 05/22/2016  . (No Known  Allergies)    Review of Systems:    All systems reviewed and negative except where noted in HPI.   Physical Exam:  BP 128/69   Pulse 76   Temp 98.3 F (36.8 C) (Oral)   Ht '5\' 5"'$  (1.651 m)   Wt 117 lb (53.1 kg)   BMI 19.47 kg/m  No LMP recorded. Patient is postmenopausal. Psych:  Alert and cooperative. Normal mood and affect. General:   Alert,  Well-developed, well-nourished, pleasant and cooperative in NAD Head:  Normocephalic and atraumatic. Eyes:  Sclera clear, no icterus.   Conjunctiva pink. Ears:  Normal auditory acuity. Nose:  No deformity, discharge, or lesions. Mouth:  No deformity or lesions,oropharynx pink & moist. Neck:  Supple; no masses or thyromegaly. Lungs:  Respirations even and unlabored.  Clear throughout to auscultation.   No wheezes, crackles, or rhonchi. No acute distress. Heart:  Regular rate and rhythm; no murmurs, clicks, rubs, or gallops. Abdomen:  Normal bowel sounds.  No bruits.  Soft, Tenderness on the left upper quadrant mostly over the ribs and wall palpating the rectus abdominis muscles touching the costal margin on the left and non-distended without masses, hepatosplenomegaly or hernias noted.  No guarding or rebound tenderness.  Negative Carnett sign.   Rectal:  Deferred.  Msk:  Symmetrical without gross deformities.  Good, equal movement & strength bilaterally. Pulses:  Normal pulses noted. Extremities:  No clubbing or edema.  No cyanosis. Neurologic:  Alert and oriented x3;  grossly normal neurologically. Skin:  Intact without significant lesions or rashes.  No jaundice. Lymph Nodes:  No significant cervical adenopathy. Psych:  Alert and cooperative. Normal mood and affect.  Imaging Studies: Dg Chest 1 View  Result Date: 04/30/2016 CLINICAL DATA:  60 year old female with history of pneumothorax. History of coronary artery disease and lupus. Additional history of thoracotomy on April 17, 2016 for wedge resection in the apex of the left upper  lobe (benign disease diagnosed). EXAM: CHEST 1 VIEW COMPARISON:  Chest x-ray 04/28/2016. FINDINGS: Previously noted small left apical pneumothorax is persistent and slightly increased in size, now occupying approximately 5-10% of the volume of the left hemithorax. Left-sided small bore chest tube remains in place with the pigtail formed over the left mid to upper hemithorax. Numerous skin staples are seen projecting over the left lateral hemithorax. Elevation of the left hemidiaphragm, similar to the recent prior examinations. Right lung is clear. No pleural effusions. No evidence of pulmonary edema. Heart size is normal. IMPRESSION: 1. Stable postoperative changes of recent thoracotomy with small bore left-sided chest tube in position and persistent and slightly increased small left-sided pneumothorax occupying 5-10% of the volume of the left hemithorax. Electronically Signed  By: Vinnie Langton M.D.   On: 04/30/2016 07:45   X-ray Chest Pa Or Ap  Result Date: 04/28/2016 CLINICAL DATA:  Chest tube placement for pneumothorax. EXAM: CHEST 1 VIEW COMPARISON:  04/28/2016 FINDINGS: Pigtail catheter placed in the left chest. The left pneumothorax has decreased in size. Small amount of residual pleural air along the lateral upper aspect of the chest. Surgical skin staples are again noted in the left chest. Trachea is midline. Heart and mediastinum are stable. Right lung remains clear. IMPRESSION: Left pneumothorax has markedly decreased in size following placement of a pigtail catheter. Electronically Signed   By: Markus Daft M.D.   On: 04/28/2016 15:48   Dg Chest 1 View  Result Date: 04/23/2016 CLINICAL DATA:  60 year old female with a history of removal of chest tube. EXAM: CHEST 1 VIEW COMPARISON:  04/23/2016, 04/22/2016 FINDINGS: Cardiomediastinal silhouette unchanged in size and contour. Interval removal of the left-sided thoracostomy tube. Surgical clips on the left chest. Coarsened interstitial markings  bilaterally. Tiny pneumothorax on the left is unchanged from the comparison. No large pleural effusion. IMPRESSION: Status post left chest tube removal with tiny unchanged pneumothorax. Surgical clips on the left chest wall. Similar appearance of chronic lung changes. Signed, Dulcy Fanny. Earleen Newport, DO [facility] Radiology Electronically Signed   By: Corrie Mckusick D.O.   On: 04/23/2016 16:19   Dg Chest 2 View  Result Date: 05/10/2016 CLINICAL DATA:  Left-sided chest pain EXAM: CHEST  2 VIEW COMPARISON:  05/05/2016 FINDINGS: Lungs are hyperaerated. Postoperative changes in the medial left upper lung zone. Normal heart size. The loculated anterior hydro pneumothorax is not seen today, on the lateral view. Volume loss in the left lung. No pleural effusion. IMPRESSION: Postop changes.  No active cardiopulmonary disease. Loculated left hydro pneumothorax has resolved. Electronically Signed   By: Marybelle Killings M.D.   On: 05/10/2016 16:11   Dg Chest 2 View  Result Date: 05/05/2016 CLINICAL DATA:  Shortness of breath and sharp pains on left side. Recent partial lung resection. EXAM: CHEST  2 VIEW COMPARISON:  05/04/2016 and 05/03/2016 FINDINGS: Stable volume loss in left hemithorax compatible with previous surgery. Again noted is lucency in the anterior left upper chest possibly representing a pleural air pocket. In the past, there was a chest tube at this location. This small air pocket has not changed from the recent comparison examination. Both lungs remain clear. Heart size is stable. No large layering pleural effusions. No acute bone abnormality. IMPRESSION: Stable small air pocket in the anterior left upper chest. This may represent a small loculated pneumothorax and related to old chest tube. No new chest findings. Electronically Signed   By: Markus Daft M.D.   On: 05/05/2016 16:39   Dg Chest 2 View  Result Date: 05/04/2016 CLINICAL DATA:  Postop, post left  thoracotomy and left upper lobectomy for resection of left upper lobe lesion EXAM: CHEST  2 VIEW COMPARISON:  Chest x-ray of 05/03/2016, PET-CT of 02/15/2016 FINDINGS: Changes of left upper lobectomy are noted with volume loss throughout the left hemi thorax. There is little change in a small probably loculated anterior left upper hydro pneumothorax. The right lung is clear. Mediastinal and hilar contours are unremarkable. The heart is within normal limits in size. No bony abnormality is seen. IMPRESSION: 1. Stable small loculated anterior left upper hydro pneumothorax. 2. Stable volume loss on the left after left upper lobectomy. Electronically Signed   By: Windy Canny.D.  On: 05/04/2016 08:49   Dg Chest 2 View  Result Date: 05/03/2016 CLINICAL DATA:  Status post left chest tube removal today. EXAM: CHEST  2 VIEW COMPARISON:  PA and lateral chest earlier today. FINDINGS: Pigtail catheter in the left chest has been removed. No pneumothorax is identified. There is volume loss in the left chest consistent with prior surgery. The right lung is expanded and clear. Heart size normal. No pleural effusion. IMPRESSION: Negative for pneumothorax after left chest tube removal. Electronically Signed   By: Inge Rise M.D.   On: 05/03/2016 12:50   Dg Chest 2 View  Result Date: 05/03/2016 CLINICAL DATA:  60 year old female with a history of postop check EXAM: CHEST  2 VIEW COMPARISON:  05/02/2016 FINDINGS: Cardiomediastinal silhouette unchanged from prior. Similar appearance of asymmetric elevation the left hemidiaphragm. Interstitial opacities, similar prior. No confluent airspace disease or pneumothorax. No pleural effusion. Unchanged position of left thoracostomy tube. Architectural distortion in the left hilum again noted. IMPRESSION: Unchanged thoracostomy tube without visualized pneumothorax. Chronic lung changes. Signed, Dulcy Fanny. Earleen Newport, DO [facility]  Radiology Electronically Signed   By: Corrie Mckusick D.O.   On: 05/03/2016 08:31   Dg Chest 2 View  Result Date: 05/02/2016 CLINICAL DATA:  Postop check EXAM: CHEST  2 VIEW COMPARISON:  05/01/2016 FINDINGS: Left pleural drainage catheter remains in place, unchanged. No pneumothorax. Heart is normal size. Stable elevation of the left hemidiaphragm. No confluent opacities or effusions. No acute bony abnormality. IMPRESSION: Left pleural drainage catheter remains in place without pneumothorax. No acute findings. Electronically Signed   By: Rolm Baptise M.D.   On: 05/02/2016 09:30   Dg Chest 2 View  Result Date: 04/28/2016 CLINICAL DATA:  Left lung mass removed EXAM: CHEST  2 VIEW COMPARISON:  04/23/2016 FINDINGS: Skin staples left chest wall again noted. Cardiomediastinal silhouette is stable. There is mild atelectasis in left upper lobe. Progression of left upper pneumothorax measures 2 cm thickness. About 7-10%. IMPRESSION: Progression of left upper pneumothorax about 7-10%. Mild atelectasis in left upper lobe. These results were called by telephone at the time of interpretation on 04/28/2016 at 9:51 am to Dr. [name] , who verbally acknowledged these results. Electronically Signed   By: Lahoma Crocker M.D.   On: 04/28/2016 09:52   Dg Chest 2 View  Result Date: 04/23/2016 CLINICAL DATA:  Left chest tube. Left upper lobe thoracotomy 04/17/2016. Ex-smoker. EXAM: CHEST  2 VIEW COMPARISON:  04/22/2016 FINDINGS: Numerous leads and wires project over the chest. Left chest tube is unchanged in position. Marked cardiomegaly. Left hemidiaphragm elevation. Trace right pleural fluid or thickening. Less than 5% lateral left-sided pneumothorax is similar. Diffuse interstitial thickening. No lobar consolidation. IMPRESSION: No significant change since the prior exam. Left chest tube in place with similar less than 5% lateral left sided pneumothorax. Left sided volume loss with hemidiaphragm elevation. Electronically  Signed   By: Abigail Miyamoto M.D.   On: 04/23/2016 09:50   Dg Chest Port 1 View  Result Date: 05/01/2016 CLINICAL DATA:  Recent chest tube placement for pneumothorax EXAM: PORTABLE CHEST 1 VIEW COMPARISON:  April 30, 2016 FINDINGS: Chest tube remains on the left without apparent pneumothorax currently. There is a nipple shadow on the right. There is no lung edema or consolidation. There is stable elevation of the left hemidiaphragm. Heart size and pulmonary vascularity are normal. There is atherosclerotic calcification in the aortic arch region. No adenopathy. No evident bone lesions. IMPRESSION: Stable chest tube  on the left without apparent pneumothorax. Stable elevation left hemidiaphragm. No edema or consolidation. Aortic atherosclerosis. Electronically Signed   By: Lowella Grip III M.D.   On: 05/01/2016 11:03   Dg Chest Port 1 View  Result Date: 04/29/2016 CLINICAL DATA:  Status post recent left upper lobectomy, with left-sided chest tube placement. Subsequent encounter. EXAM: PORTABLE CHEST 1 VIEW COMPARISON:  Chest radiograph performed earlier today at 3:27 p.m. FINDINGS: The previously noted trace left-sided pneumothorax is relatively stable in appearance, with a left-sided chest pigtail catheter again noted. The right lung appears clear. No pleural effusion is seen. The cardiomediastinal silhouette is borderline normal in size. No acute osseous abnormalities are seen. IMPRESSION: Trace left-sided pneumothorax is relatively stable in appearance, with left-sided chest pigtail catheter again noted. Electronically Signed   By: Garald Balding M.D.   On: 04/29/2016 03:18   Ct Image Guided Drainage By Percutaneous Catheter  Result Date: 04/28/2016 CLINICAL DATA:  Recent left upper lobectomy. Pneumothorax and chest radiography. Chest pain. Chest tube requested. EXAM: CT GUIDED LEFT CHEST TUBE PLACEMENT ANESTHESIA/SEDATION: Intravenous Fentanyl and Versed were administered as conscious sedation during  continuous monitoring of the patient's level of consciousness and physiological / cardiorespiratory status by the radiology RN, with a total moderate sedation time of less than 30 minutes. PROCEDURE: The procedure, risks, benefits, and alternatives were explained to the patient. Questions regarding the procedure were encouraged and answered. The patient understands and consents to the procedure. Select axial scans through the thorax were obtained. The anterior apical pneumothorax was localized an appropriate skin entry site was determined and marked. The operative field was prepped with chlorhexidinein a sterile fashion, and a sterile drape was applied covering the operative field. A sterile gown and sterile gloves were used for the procedure. Local anesthesia was provided with 1% Lidocaine. Under CT fluoroscopic guidance, a 7 cm Yueh needle was advanced into the anterior pleural space directed towards the apex. An Amplatz guidewire advanced easily, position confirmed on CT. Tract dilated to facilitate placement of a 14 French pigtail catheter, formed in the anterior aspect of the apical pneumothorax. Catheter position confirmed on CT. Catheter was secured to the skin with 0 Prolene suture and StatLock and placed to a Pleur-Evac suction. Follow-up scan shows significant evacuation of the pneumothorax. Catheter was covered with a sterile dressing. The patient tolerated the procedure well. COMPLICATIONS: None immediate FINDINGS: Anterior apical pneumothorax was localized. 14 French pigtail drain catheter placed as chest tube, to Pleur-Evac suction. IMPRESSION: 1. Technically successful CT-guided left chest tube placement. Electronically Signed   By: Lucrezia Europe M.D.   On: 04/28/2016 15:10    Assessment and Plan:   Sheila Suarez is a 60 y.o. y/o female who comes in today with what appears to be musculoskeletal pain that started right after her surgery. The patient's gas pains in her left upper quadrant are a  bit higher to be intestinal. The patient has never had a colonoscopy but due to her abdominal pain in the left upper quadrant and her need for a colonoscopy at her age she will be set up for an EGD and colonoscopy. I have discussed risks & benefits which include, but are not limited to, bleeding, infection, perforation & drug reaction.  The patient agrees with this plan & written consent will be obtained.     Note: This dictation was prepared with Dragon dictation along with smaller phrase technology. Any transcriptional errors that result from this process are unintentional.

## 2016-05-23 ENCOUNTER — Inpatient Hospital Stay: Payer: BLUE CROSS/BLUE SHIELD | Attending: Hematology and Oncology | Admitting: Hematology and Oncology

## 2016-05-23 ENCOUNTER — Other Ambulatory Visit: Payer: Self-pay | Admitting: *Deleted

## 2016-05-23 VITALS — BP 137/75 | HR 82 | Temp 96.4°F | Resp 18 | Wt 116.0 lb

## 2016-05-23 DIAGNOSIS — M329 Systemic lupus erythematosus, unspecified: Secondary | ICD-10-CM | POA: Diagnosis not present

## 2016-05-23 DIAGNOSIS — G4700 Insomnia, unspecified: Secondary | ICD-10-CM | POA: Insufficient documentation

## 2016-05-23 DIAGNOSIS — R918 Other nonspecific abnormal finding of lung field: Secondary | ICD-10-CM

## 2016-05-23 DIAGNOSIS — Z800 Family history of malignant neoplasm of digestive organs: Secondary | ICD-10-CM | POA: Diagnosis not present

## 2016-05-23 DIAGNOSIS — I2510 Atherosclerotic heart disease of native coronary artery without angina pectoris: Secondary | ICD-10-CM

## 2016-05-23 DIAGNOSIS — R634 Abnormal weight loss: Secondary | ICD-10-CM | POA: Insufficient documentation

## 2016-05-23 DIAGNOSIS — R911 Solitary pulmonary nodule: Secondary | ICD-10-CM | POA: Insufficient documentation

## 2016-05-23 DIAGNOSIS — Z79899 Other long term (current) drug therapy: Secondary | ICD-10-CM | POA: Diagnosis not present

## 2016-05-23 DIAGNOSIS — Z87891 Personal history of nicotine dependence: Secondary | ICD-10-CM | POA: Diagnosis not present

## 2016-05-23 DIAGNOSIS — R0600 Dyspnea, unspecified: Secondary | ICD-10-CM

## 2016-05-23 DIAGNOSIS — J984 Other disorders of lung: Secondary | ICD-10-CM | POA: Diagnosis not present

## 2016-05-23 DIAGNOSIS — Z801 Family history of malignant neoplasm of trachea, bronchus and lung: Secondary | ICD-10-CM | POA: Insufficient documentation

## 2016-05-23 DIAGNOSIS — G8928 Other chronic postprocedural pain: Secondary | ICD-10-CM | POA: Diagnosis not present

## 2016-05-23 DIAGNOSIS — Z8049 Family history of malignant neoplasm of other genital organs: Secondary | ICD-10-CM | POA: Diagnosis not present

## 2016-05-23 NOTE — Progress Notes (Signed)
Patient has pain in left ribcage.  States she has no appetite.  Drinks boost.  States she is SOB with any exertion.  Will follow up with Dr. Genevive Bi next week.

## 2016-05-23 NOTE — Progress Notes (Signed)
[facility] day:  05/23/16  Chief Complaint: Sheila Suarez is a 59 y.o. female with lupus and an enlarging left upper lobe nodule who is seen for assessment after interval lung resection.  HPI: The patient was last seen in the medical oncology clinic on 04/07/2016.  At that time, she was losing weight despite eating all of the time.  We discussed her upcoming lung surgery for an enlarging left upper lobe nodule.  She underwent left thoracotomy with left upper lobe lobectomy on 04/17/2016 by Dr. [name].  Pathology revealed subpleural fibro-elastotic scar consistent with benign pulmonary cap.  There was no evidence of malignancy.  There were 2 benign lymph nodes.  She was admitted to [facility] from 04/28/2016 - 05/04/2016 with shortness of breath and a left sided pneumothorax.  Chest tube was placed.   She is scheduled for colonoscopy with Dr. Allen Norris.  Symptomatically, she notes persistent pain since her chest tube came out.  She is on 4 pain medications.  She is sleeping poorly.   Past Medical History:  Diagnosis Date  . Cancer ([place])    Lung  . Coronary artery disease   . Lupus   . Shortness of breath dyspnea     Past Surgical History:  Procedure Laterality Date  . CYST EXCISION Left    hand  . FLEXIBLE BRONCHOSCOPY N/A 04/17/2016   Procedure: FLEXIBLE BRONCHOSCOPY;  Surgeon: [name], MD;  Location: [facility] ORS;  Service: Thoracic;  Laterality: N/A;  . THORACOTOMY Left 04/17/2016   Procedure: THORACOTOMY LEFT UPPER LOBE LUNG RESECTION;  Surgeon: [name], MD;  Location: [facility] ORS;  Service: Thoracic;  Laterality: Left;  . TUBAL LIGATION Bilateral    37 years ago    Family History  Problem Relation Age of Onset  . Pancreatic cancer Sister   . Lung cancer Sister   . Lung cancer Brother   . Stomach cancer Paternal Grandmother   . Heart disease Father   . Cancer Daughter 63    cervical  . Cancer Maternal Aunt     brain  .  Cancer Maternal Uncle     prostate  . Cancer Maternal Aunt     bone    Social History:  reports that she quit smoking about 2 months ago. Her smoking use included Cigarettes. She has a 8.75 pack-year smoking history. She quit smokeless tobacco use about 2 months ago. She reports that she does not drink alcohol or use drugs.  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  The patient is alone today.  Allergies: No Known Allergies  Current Medications: Current Outpatient Prescriptions  Medication Sig Dispense Refill  . gabapentin (NEURONTIN) 300 MG capsule Take 300 mg by mouth 3 (three) times daily.  0  . ondansetron (ZOFRAN) 4 MG tablet Take 1 tablet (4 mg total) by mouth every 8 (eight) hours as needed. 20 tablet 0  . oxyCODONE (OXY IR/ROXICODONE) 5 MG immediate release tablet TAKE 1 TO 2 TABLETS BY MOUTH EVERY 4 HOURS AS NEEDED FOR SEVERE PAIN  0  . oxyCODONE-acetaminophen (PERCOCET/ROXICET) 5-325 MG tablet Take 1-2 tablets by mouth every 4 (four) hours as needed for moderate pain. 30 tablet 0  . ranitidine (ZANTAC) 150 MG tablet Take 1 tablet (150 mg total) by mouth 2 (two) times daily. 60 tablet 1  . sucralfate (CARAFATE) 1 g tablet Take 1 tablet (1 g total) by mouth 4 (four) times daily. 60 tablet 0  . traMADol (ULTRAM)  50 MG tablet Take 2 tablets (100 mg total) by mouth every 6 (six) hours. 60 tablet 0   No current facility-administered medications for this visit.     Review of Systems:  GENERAL:  Uncomfortable since surgery.  No fevers.  Weight up 3 pounds. PERFORMANCE STATUS (ECOG):  1 HEENT:  Poor vision (near and far).  Needs glasses.  No runny nose, sore throat, mouth sores or tenderness. Lungs: Shortness of breath with exertion.  Chest pain at site of chest tube pull.  No cough.  No hemoptysis. Cardiac:  No chest pain, palpitations, orthopnea, or PND. GI:  No nausea, vomiting, diarrhea, constipation, melena or hematochezia. GU:  No urgency, frequency, dysuria, or  hematuria. Musculoskeletal:  No back pain.  No joint pain.  No muscle tenderness. Extremities:  No pain or swelling. Skin:  No rashes or skin changes. Neuro:  No headache, numbness or weakness, balance or coordination issues. Endocrine:  No diabetes, thyroid issues, hot flashes or night sweats. Psych:  Poor sleep.  No mood changes, depression or anxiety. Pain:  Pain at old chest tube site. Review of systems:  All other systems reviewed and found to be negative.  Physical Exam: Blood pressure 137/75, pulse 82, temperature (!) 96.4 F (35.8 C), temperature source Tympanic, resp. rate 18, weight 115 lb 15.4 oz (52.6 kg). GENERAL:  Thin woman rocking back and forth with a pillow at her side in the exam room. MENTAL STATUS:  Alert and oriented to person, place and time. HEAD:  Wearing a blue cap.  Braided brown hair with graying.  Normocephalic, atraumatic, face symmetric, no Cushingoid features. EYES:  Glasses.  Brown eyes.  Pupils equal round and reactive to light and accomodation.  No conjunctivitis or scleral icterus. ENT:  Oropharynx clear without lesion.  Dentures.  Tongue normal. Mucous membranes moist.  RESPIRATORY:  Bilateral breath sounds.  Clear to auscultation without rales, wheezes or rhonchi. CARDIOVASCULAR:  Regular rate and rhythm without murmur, rub or gallop. CHEST WALL:  Incision well healed. ABDOMEN:  Soft, non-tender, with active bowel sounds, and no hepatosplenomegaly.  No masses. SKIN:  No rashes, ulcers or lesions. EXTREMITIES: No edema, skin discoloration or tenderness.  No palpable cords. LYMPH NODES: No palpable cervical, supraclavicular, axillary or inguinal adenopathy  NEUROLOGICAL: Unremarkable. PSYCH:  Appropriate.   No visits with results within 3 Day(s) from this visit.  Latest known visit with results is:  Admission on 05/10/2016, Discharged on 05/10/2016  Component Date Value Ref Range Status  . Sodium 05/10/2016 138  135 - 145 mmol/L Final  . Potassium  05/10/2016 3.7  3.5 - 5.1 mmol/L Final  . Chloride 05/10/2016 106  101 - 111 mmol/L Final  . CO2 05/10/2016 24  22 - 32 mmol/L Final  . Glucose, Bld 05/10/2016 138* 65 - 99 mg/dL Final  . BUN 05/10/2016 13  6 - 20 mg/dL Final  . Creatinine, Ser 05/10/2016 0.79  0.44 - 1.00 mg/dL Final  . Calcium 05/10/2016 9.6  8.9 - 10.3 mg/dL Final  . GFR calc non Af Amer 05/10/2016 >60  >60 mL/min Final  . GFR calc Af Amer 05/10/2016 >60  >60 mL/min Final   Comment: (NOTE) The eGFR has been calculated using the CKD EPI equation. This calculation has not been validated in all clinical situations. eGFR's persistently <60 mL/min signify possible Chronic Kidney Disease.   . Anion gap 05/10/2016 8  5 - 15 Final  . WBC 05/10/2016 6.9  3.6 - 11.0 K/uL Final  .   RBC 05/10/2016 4.48  3.80 - 5.20 MIL/uL Final  . Hemoglobin 05/10/2016 13.2  12.0 - 16.0 g/dL Final  . HCT 05/10/2016 40.9  35.0 - 47.0 % Final  . MCV 05/10/2016 91.2  80.0 - 100.0 fL Final  . MCH 05/10/2016 29.4  26.0 - 34.0 pg Final  . MCHC 05/10/2016 32.3  32.0 - 36.0 g/dL Final  . RDW 05/10/2016 12.9  11.5 - 14.5 % Final  . Platelets 05/10/2016 471* 150 - 440 K/uL Final  . Troponin I 05/10/2016 <0.03  <0.03 ng/mL Final    Assessment:  Sheila Suarez is a 60 y.o. female with lupus x 8 years.  CT angiogram of the chest, abdominal and pelvis on 02/17/2016 revealed a 1.1 cm spiculated lesion in the left upper lobe increased from prior exam (08/30/2010). There were stable small pulmonary nodules bilaterally (2-3 mm).  She has a 15 pack year smoking history.  PET scan on 02/15/2016 revealed an irregular 1.0 cm apical LUL pulmonary nodule (SUV 0.7).  There was a 4 mm right middle lobe solid pulmonary nodule (stable since 08/02/2010) and two additional right lower lobe 3 mm pulmonary nodules.  PFTs revealed an FEV1 was 84% and her DLCO was 67%.  She underwent left thoracotomy with left upper lobe lobectomy on 04/17/2016.  Pathology revealed  subpleural fibro-elastotic scar consistent with benign pulmonary cap.  There was no evidence of malignancy.  There were 2 benign lymph nodes.  She has received the series of rabies injections after being bitten by a stray dog.  She has never had a colonoscopy.    She notes a history of 16 pound weight loss over an unclear period of time.  Her weight has been stable since 02/09/2016.  TSH was normal on 02/09/2016.  Symptomatically, she has had pain since her chest tube was pulled.   Plan: 1.  Discuss results of LUL lobectomy.  No tumor seen. 2.  Discuss pain since chest tube pull.  Patient has follow-up appointment with Dr. Oakes on 05/26/2016. 3.  Discuss plans for colonoscopy. 4.  RTC in 1 month for MD assessment and labs (CBC with diff, CMP).   Melissa C Corcoran, MD  05/23/2016, 10:14 AM   

## 2016-05-25 ENCOUNTER — Other Ambulatory Visit: Payer: Self-pay | Admitting: Cardiothoracic Surgery

## 2016-05-25 DIAGNOSIS — J9383 Other pneumothorax: Secondary | ICD-10-CM

## 2016-05-26 ENCOUNTER — Ambulatory Visit
Admission: RE | Admit: 2016-05-26 | Discharge: 2016-05-26 | Disposition: A | Discharge status: Home / Self Care | Payer: BLUE CROSS/BLUE SHIELD | Source: Ambulatory Visit | Attending: Cardiothoracic Surgery | Admitting: Cardiothoracic Surgery

## 2016-05-26 ENCOUNTER — Ambulatory Visit (INDEPENDENT_AMBULATORY_CARE_PROVIDER_SITE_OTHER): Payer: BLUE CROSS/BLUE SHIELD | Admitting: Cardiothoracic Surgery

## 2016-05-26 ENCOUNTER — Encounter: Payer: Self-pay | Admitting: Cardiothoracic Surgery

## 2016-05-26 VITALS — BP 123/75 | HR 92 | Temp 98.3°F | Wt 114.0 lb

## 2016-05-26 DIAGNOSIS — R911 Solitary pulmonary nodule: Secondary | ICD-10-CM

## 2016-05-26 DIAGNOSIS — R918 Other nonspecific abnormal finding of lung field: Secondary | ICD-10-CM

## 2016-05-26 DIAGNOSIS — J9383 Other pneumothorax: Secondary | ICD-10-CM

## 2016-05-26 MED ORDER — OXYCODONE-ACETAMINOPHEN 5-325 MG PO TABS: 1.0000 | ORAL_TABLET | ORAL | 0 refills | Status: DC | PRN

## 2016-05-26 NOTE — Patient Instructions (Signed)
We will be referring you to see the Pain Clinic to see if they are able to help you with the pain.

## 2016-05-26 NOTE — Progress Notes (Signed)
  Patient ID: Sheila Suarez, female   DOB: 05-15-1956, 60 y.o.   MRN: [id number]  HISTORY: This patient returns. She is about 6 weeks out from her left thoracotomy and left upper lobectomy. She continues to complain of pain in her left upper quadrant. She states this feels like a knot and is moving underneath of her ribs. She continues to have some shortness of breath. She's not taking the Neurontin as she thinks this may be causing hallucinations. She would like a refill on her Percocet. She saw our gastroenterologist and she is scheduled to have a colonoscopy and upper GI endoscopy early next month.   Vitals:   05/26/16 0858  BP: 123/75  Pulse: 92  Temp: 98.3 F (36.8 C)     EXAM:    Resp: Lungs are clear bilaterally.  No respiratory distress, normal effort. Heart:  Regular without murmurs Abd:  Abdomen is soft, non distended and non tender. No masses are palpable.  There is no rebound and no guarding.  Neurological: Alert and oriented to person, place, and time. Coordination normal.  Skin: Skin is warm and dry. No rash noted. No diaphoretic. No erythema. No pallor.  Psychiatric: Normal mood and affect. Normal behavior. Judgment and thought content normal.    ASSESSMENT: I have independently reviewed the patient's chest x-ray from today. There are the expected postoperative changes but no acute problems.   PLAN:   I'm not entirely sure what the pain may be due to. She does not want to take the gabapentin. We will continue to prescribe her with some oral narcotics that she states this is all that will help her sleep at night. She is scheduled to have a colonoscopy and upper GI endoscopy early next month. We'll repeat a CT scan at 3 months postop and I will see her in conjunction with Dr. Mike Gip. We'll also make an appointment for her to see our pain management clinic.    [name], MD

## 2016-05-29 ENCOUNTER — Other Ambulatory Visit: Payer: Self-pay | Admitting: Family Medicine

## 2016-05-29 ENCOUNTER — Ambulatory Visit
Admission: RE | Admit: 2016-05-29 | Discharge: 2016-05-29 | Disposition: A | Discharge status: Home / Self Care | Payer: BLUE CROSS/BLUE SHIELD | Source: Ambulatory Visit | Attending: Family Medicine | Admitting: Family Medicine

## 2016-05-29 DIAGNOSIS — M542 Cervicalgia: Secondary | ICD-10-CM

## 2016-06-01 ENCOUNTER — Encounter: Payer: Self-pay | Admitting: *Deleted

## 2016-06-02 ENCOUNTER — Encounter: Payer: Self-pay | Admitting: *Deleted

## 2016-06-02 ENCOUNTER — Ambulatory Visit: Payer: BLUE CROSS/BLUE SHIELD | Admitting: Anesthesiology

## 2016-06-02 ENCOUNTER — Ambulatory Visit: Admit: 2016-06-02 | Payer: BLUE CROSS/BLUE SHIELD

## 2016-06-02 ENCOUNTER — Ambulatory Visit
Admission: RE | Admit: 2016-06-02 | Discharge: 2016-06-02 | Disposition: A | Discharge status: Home / Self Care | Payer: BLUE CROSS/BLUE SHIELD | Source: Ambulatory Visit | Attending: Gastroenterology | Admitting: Gastroenterology

## 2016-06-02 ENCOUNTER — Encounter
Admission: RE | Disposition: A | Discharge status: Home / Self Care | Payer: Self-pay | Source: Ambulatory Visit | Attending: Gastroenterology

## 2016-06-02 DIAGNOSIS — Z79899 Other long term (current) drug therapy: Secondary | ICD-10-CM | POA: Diagnosis not present

## 2016-06-02 DIAGNOSIS — D649 Anemia, unspecified: Secondary | ICD-10-CM | POA: Diagnosis not present

## 2016-06-02 DIAGNOSIS — B9681 Helicobacter pylori [H. pylori] as the cause of diseases classified elsewhere: Secondary | ICD-10-CM | POA: Diagnosis not present

## 2016-06-02 DIAGNOSIS — I2510 Atherosclerotic heart disease of native coronary artery without angina pectoris: Secondary | ICD-10-CM | POA: Diagnosis not present

## 2016-06-02 DIAGNOSIS — Z87891 Personal history of nicotine dependence: Secondary | ICD-10-CM | POA: Insufficient documentation

## 2016-06-02 DIAGNOSIS — Z1211 Encounter for screening for malignant neoplasm of colon: Secondary | ICD-10-CM | POA: Diagnosis not present

## 2016-06-02 DIAGNOSIS — M329 Systemic lupus erythematosus, unspecified: Secondary | ICD-10-CM | POA: Diagnosis not present

## 2016-06-02 DIAGNOSIS — Z85118 Personal history of other malignant neoplasm of bronchus and lung: Secondary | ICD-10-CM | POA: Diagnosis not present

## 2016-06-02 DIAGNOSIS — K2970 Gastritis, unspecified, without bleeding: Secondary | ICD-10-CM | POA: Diagnosis not present

## 2016-06-02 DIAGNOSIS — R1012 Left upper quadrant pain: Secondary | ICD-10-CM | POA: Diagnosis not present

## 2016-06-02 HISTORY — PX: ESOPHAGOGASTRODUODENOSCOPY (EGD) WITH PROPOFOL: SHX5813

## 2016-06-02 HISTORY — PX: COLONOSCOPY WITH PROPOFOL: SHX5780

## 2016-06-02 SURGERY — COLONOSCOPY WITH PROPOFOL
Anesthesia: General

## 2016-06-02 MED ORDER — LIDOCAINE HCL (CARDIAC) 20 MG/ML IV SOLN
INTRAVENOUS | Status: DC | PRN
  Administered 2016-06-02: 60 mg via INTRAVENOUS

## 2016-06-02 MED ORDER — GLYCOPYRROLATE 0.2 MG/ML IJ SOLN
INTRAMUSCULAR | Status: DC | PRN
  Administered 2016-06-02: 0.2 mg via INTRAVENOUS

## 2016-06-02 MED ORDER — MIDAZOLAM HCL 2 MG/2ML IJ SOLN
INTRAMUSCULAR | Status: DC | PRN
  Administered 2016-06-02: 1 mg via INTRAVENOUS

## 2016-06-02 MED ORDER — PHENYLEPHRINE HCL 10 MG/ML IJ SOLN
INTRAMUSCULAR | Status: DC | PRN
  Administered 2016-06-02 (×3): 100 ug via INTRAVENOUS

## 2016-06-02 MED ORDER — PROPOFOL 500 MG/50ML IV EMUL
INTRAVENOUS | Status: DC | PRN
  Administered 2016-06-02: 140 ug/kg/min via INTRAVENOUS

## 2016-06-02 MED ORDER — SODIUM CHLORIDE 0.9 % IV SOLN
INTRAVENOUS | Status: DC
  Administered 2016-06-02: 08:00:00 via INTRAVENOUS
  Administered 2016-06-02: 1000 mL via INTRAVENOUS

## 2016-06-02 MED ORDER — PROPOFOL 10 MG/ML IV BOLUS
INTRAVENOUS | Status: DC | PRN
Start: 2016-06-02 — End: 2016-06-02
  Administered 2016-06-02: 50 mg via INTRAVENOUS

## 2016-06-02 NOTE — H&P (Signed)
Sheila Bellows MD [address], [place], [place] [address] Phone: [phone] Fax : [phone]  Primary Care Physician:  [facility] Primary Gastroenterologist:  Dr. Jonathon Suarez   Pre-Procedure History & Physical: HPI:  Sheila Suarez is a 60 y.o. female is here for an endoscopy and colonoscopy.   Past Medical History:  Diagnosis Date  . Cancer ([place])    Lung  . Coronary artery disease   . Lupus   . Shortness of breath dyspnea     Past Surgical History:  Procedure Laterality Date  . CYST EXCISION Left    hand  . FLEXIBLE BRONCHOSCOPY N/A 04/17/2016   Procedure: FLEXIBLE BRONCHOSCOPY;  Surgeon: Sheila Lewandowsky, MD;  Location: [facility] ORS;  Service: Thoracic;  Laterality: N/A;  . THORACOTOMY Left 04/17/2016   Procedure: THORACOTOMY LEFT UPPER LOBE LUNG RESECTION;  Surgeon: Sheila Lewandowsky, MD;  Location: [facility] ORS;  Service: Thoracic;  Laterality: Left;  . TUBAL LIGATION Bilateral    37 years ago    Prior to Admission medications   Medication Sig Start Date End Date Taking? Authorizing Provider  gabapentin (NEURONTIN) 100 MG capsule Take 100 mg by mouth 3 (three) times daily.   Yes Historical Provider, MD  bisacodyl (DULCOLAX) 5 MG EC tablet Take 5 mg by mouth daily as needed for moderate constipation.    Historical Provider, MD  ondansetron (ZOFRAN) 4 MG tablet Take 1 tablet (4 mg total) by mouth every 8 (eight) hours as needed. 05/10/16   Nance Pear, MD  oxyCODONE-acetaminophen (PERCOCET/ROXICET) 5-325 MG tablet Take 1-2 tablets by mouth every 4 (four) hours as needed for moderate pain. 05/26/16   Sheila Lewandowsky, MD  ranitidine (ZANTAC) 150 MG tablet Take 1 tablet (150 mg total) by mouth 2 (two) times daily. 05/10/16 05/10/17  Nance Pear, MD  sucralfate (CARAFATE) 1 g tablet Take 1 tablet (1 g total) by mouth 4 (four) times daily. 05/10/16   Nance Pear, MD  traMADol (ULTRAM) 50 MG tablet Take 2 tablets (100 mg total) by mouth every 6 (six) hours. 05/03/16    Sheila Lewandowsky, MD    Allergies as of 05/23/2016  . (No Known Allergies)    Family History  Problem Relation Age of Onset  . Pancreatic cancer Sister   . Lung cancer Sister   . Lung cancer Brother   . Stomach cancer Paternal Grandmother   . Heart disease Father   . Cancer Daughter 66    cervical  . Cancer Maternal Aunt     brain  . Cancer Maternal Uncle     prostate  . Cancer Maternal Aunt     bone    Social History   Social History  . Marital status: Divorced    Spouse name: N/A  . Number of children: N/A  . Years of education: N/A   Occupational History  . Not on file.   Social History Main Topics  . Smoking status: Former Smoker    Packs/day: 0.25    Years: 35.00    Types: Cigarettes    Quit date: 03/19/2016  . Smokeless tobacco: Former Systems developer    Quit date: 03/16/2016  . Alcohol use No     Comment: Rarely  . Drug use: No  . Sexual activity: Not on file   Other Topics Concern  . Not on file   Social History Narrative  . No narrative on file    Review of Systems: See HPI, otherwise negative ROS  Physical Exam: BP 132/70   Pulse  80   Temp 97 F (36.1 C) (Tympanic)   Resp 18   Ht '5\' 5"'$  (1.651 m)   Wt 112 lb (50.8 kg)   SpO2 100%   BMI 18.64 kg/m  General:   Alert,  pleasant and cooperative in NAD Head:  Normocephalic and atraumatic. Neck:  Supple; no masses or thyromegaly. Lungs:  Clear throughout to auscultation.    Heart:  Regular rate and rhythm. Abdomen:  Soft, nontender and nondistended. Normal bowel sounds, without guarding, and without rebound.   Neurologic:  Alert and  oriented x4;  grossly normal neurologically.  Impression/Plan: [name] is here for an endoscopy and colonoscopy to be performed for abdominal pain and average risk colorectal cancer screening.   Risks, benefits, limitations, and alternatives regarding  endoscopy and colonoscopy have been reviewed with the patient.  Questions have been answered.  All parties  agreeable.   Sheila Bellows, MD  06/02/2016, 7:50 AM

## 2016-06-02 NOTE — Anesthesia Postprocedure Evaluation (Signed)
Anesthesia Post Note  Patient: Sheila Suarez  Procedure(s) Performed: Procedure(s) (LRB): COLONOSCOPY WITH PROPOFOL (N/A) ESOPHAGOGASTRODUODENOSCOPY (EGD) WITH PROPOFOL (N/A)  Patient location during evaluation: PACU Anesthesia Type: General Level of consciousness: awake Pain management: satisfactory to patient Vital Signs Assessment: post-procedure vital signs reviewed and stable Respiratory status: nonlabored ventilation Cardiovascular status: stable Anesthetic complications: no    Last Vitals:  Vitals:   06/02/16 0854 06/02/16 0904  BP: 126/82 117/85  Pulse: 68 (!) 58  Resp: 19 16  Temp:      Last Pain:  Vitals:   06/02/16 0834  TempSrc: Tympanic  PainSc:                  V[name]

## 2016-06-02 NOTE — Op Note (Signed)
[facility] Gastroenterology Patient Name: Sheila Suarez Procedure Date: 06/02/2016 7:56 AM MRN: [id number] Account #: [id number] Date of Birth: 03-Mar-1956 Admit Type: Ambulatory Age: 60 Room: [facility] ENDO ROOM 1 Gender: Female Note Status: Finalized Procedure:            Colonoscopy Indications:          Screening for colorectal malignant neoplasm Providers:            [name] MD, MD, Carmelina Dane. Sheppard Coil MD, MD Referring MD:         Baxter Kail. Rebeca Alert MD, MD (Referring MD) Medicines:            Monitored Anesthesia Care Complications:        No immediate complications. Procedure:            Pre-Anesthesia Assessment:                       - Prior to the procedure, a History and Physical was                        performed, and patient medications, allergies and                        sensitivities were reviewed. The patient's tolerance of                        previous anesthesia was reviewed.                       - ASA Grade Assessment: III - A patient with severe                        systemic disease.                       - Prior to the procedure, a History and Physical was                        performed, and patient medications, allergies and                        sensitivities were reviewed. The patient's tolerance of                        previous anesthesia was reviewed.                       - ASA Grade Assessment: III - A patient with severe                        systemic disease.                       After obtaining informed consent, the colonoscope was                        passed under direct vision. Throughout the procedure,                        the patient's blood pressure, pulse, and oxygen  saturations were monitored continuously. The                        Colonoscope was introduced through the anus and                        advanced to the the cecum, identified by the                        appendiceal  orifice, IC valve and transillumination.                        The entire colon was examined. The colonoscopy was                        performed with ease. The patient tolerated the                        procedure well. The quality of the bowel preparation                        was excellent. Findings:      The entire examined colon appeared normal on direct and retroflexion       views. Impression:           - The entire examined colon is normal on direct and                        retroflexion views.                       - No specimens collected. Recommendation:       - Resume regular diet today. Procedure Code(s):    --- Professional ---                       F8101, Colorectal cancer screening; colonoscopy on                        individual not meeting criteria for high risk Diagnosis Code(s):    --- Professional ---                       Z12.11, Encounter for screening for malignant neoplasm                        of colon CPT copyright 2016 American Medical Association. All rights reserved. The codes documented in this report are preliminary and upon coder review may  be revised to meet current compliance requirements. [name], MD [name] MD, MD 06/02/2016 8:32:21 AM This report has been signed electronically. Gayland Curry MD, MD Number of Addenda: 0 Note Initiated On: 06/02/2016 7:56 AM Scope Withdrawal Time: 0 hours 11 minutes 20 seconds  Total Procedure Duration: 0 hours 15 minutes 8 seconds       [facility]

## 2016-06-02 NOTE — Anesthesia Preprocedure Evaluation (Signed)
Anesthesia Evaluation  Patient identified by MRN, date of birth, ID band Patient awake    Reviewed: Allergy & Precautions, NPO status , Patient's Chart, lab work & pertinent test results  Airway Mallampati: II       Dental  (+) Upper Dentures, Lower Dentures   Pulmonary shortness of breath, former smoker,  Lung ca  Recent Thoracotomy   + decreased breath sounds      Cardiovascular  Rhythm:Regular Rate:Normal     Neuro/Psych negative neurological ROS  negative psych ROS   GI/Hepatic negative GI ROS, Neg liver ROS,   Endo/Other  negative endocrine ROS  Renal/GU negative Renal ROS     Musculoskeletal negative musculoskeletal ROS (+)   Abdominal   Peds  Hematology  (+) anemia ,   Anesthesia Other Findings   Reproductive/Obstetrics                             Anesthesia Physical Anesthesia Plan  ASA: III  Anesthesia Plan: General   Post-op Pain Management:    Induction: Intravenous  Airway Management Planned: Natural Airway and Nasal Cannula  Additional Equipment:   Intra-op Plan:   Post-operative Plan:   Informed Consent: I have reviewed the patients History and Physical, chart, labs and discussed the procedure including the risks, benefits and alternatives for the proposed anesthesia with the patient or authorized representative who has indicated his/her understanding and acceptance.     Plan Discussed with: CRNA  Anesthesia Plan Comments:         Anesthesia Quick Evaluation

## 2016-06-02 NOTE — Transfer of Care (Signed)
Immediate Anesthesia Transfer of Care Note  Patient: Sheila Suarez  Procedure(s) Performed: Procedure(s): COLONOSCOPY WITH PROPOFOL (N/A) ESOPHAGOGASTRODUODENOSCOPY (EGD) WITH PROPOFOL (N/A)  Patient Location: Endoscopy Unit  Anesthesia Type:General  Level of Consciousness: awake, alert , oriented and patient cooperative  Airway & Oxygen Therapy: Patient Spontanous Breathing and Patient connected to nasal cannula oxygen  Post-op Assessment: Report given to RN, Post -op Vital signs reviewed and stable and Patient moving all extremities X 4  Post vital signs: Reviewed and stable  Last Vitals:  Vitals:   06/02/16 0733  BP: 132/70  Pulse: 80  Resp: 18  Temp: 36.1 C    Last Pain:  Vitals:   06/02/16 0733  TempSrc: Tympanic  PainSc: 6          Complications: No apparent anesthesia complications

## 2016-06-02 NOTE — Op Note (Signed)
[facility] Gastroenterology Patient Name: Sheila Suarez Procedure Date: 06/02/2016 7:53 AM MRN: [id number] Account #: [id number] Date of Birth: 10/27/1955 Admit Type: Ambulatory Age: 60 Room: [facility] ENDO ROOM 1 Gender: Female Note Status: Finalized Procedure:            Upper GI endoscopy Indications:          Abdominal pain in the left upper quadrant Providers:            [name] MD, MD, Carmelina Dane. Sheppard Coil MD, MD Referring MD:         Baxter Kail. Rebeca Alert MD, MD (Referring MD) Medicines:            Monitored Anesthesia Care Complications:        No immediate complications. Procedure:            Pre-Anesthesia Assessment:                       - ASA Grade Assessment: III - A patient with severe                        systemic disease.                       - Prior to the procedure, a History and Physical was                        performed, and patient medications, allergies and                        sensitivities were reviewed. The patient's tolerance of                        previous anesthesia was reviewed.                       After obtaining informed consent, the endoscope was                        passed under direct vision. Throughout the procedure,                        the patient's blood pressure, pulse, and oxygen                        saturations were monitored continuously. The Endoscope                        was introduced through the mouth, and advanced to the                        third part of duodenum. The upper GI endoscopy was                        accomplished with ease. The patient tolerated the                        procedure well. Findings:      The esophagus was normal.      The examined duodenum was normal.      The entire examined stomach was normal. This was biopsied with a cold  forceps for Helicobacter pylori cultures. Impression:           - Normal esophagus.                       - Normal examined duodenum.                 - Normal stomach. Biopsied.                       - Normal esophagus, stomach, and examined duodenum. Recommendation:       - Await pathology results. Procedure Code(s):    --- Professional ---                       [id number], Esophagogastroduodenoscopy, flexible, transoral;                        with biopsy, single or multiple Diagnosis Code(s):    --- Professional ---                       R10.12, Left upper quadrant pain CPT copyright 2016 American Medical Association. All rights reserved. The codes documented in this report are preliminary and upon coder review may  be revised to meet current compliance requirements. [name], MD [name] MD, MD 06/02/2016 8:12:18 AM This report has been signed electronically. Gayland Curry MD, MD Number of Addenda: 0 Note Initiated On: 06/02/2016 7:53 AM      [facility]

## 2016-06-05 ENCOUNTER — Encounter: Payer: Self-pay | Admitting: Gastroenterology

## 2016-06-05 LAB — SURGICAL PATHOLOGY

## 2016-06-06 ENCOUNTER — Emergency Department
Admission: EM | Admit: 2016-06-06 | Discharge: 2016-06-07 | Disposition: A | Discharge status: Home / Self Care | Payer: BLUE CROSS/BLUE SHIELD | Attending: Emergency Medicine | Admitting: Emergency Medicine

## 2016-06-06 ENCOUNTER — Emergency Department: Payer: BLUE CROSS/BLUE SHIELD

## 2016-06-06 ENCOUNTER — Encounter: Payer: Self-pay | Admitting: *Deleted

## 2016-06-06 DIAGNOSIS — Z79899 Other long term (current) drug therapy: Secondary | ICD-10-CM | POA: Diagnosis not present

## 2016-06-06 DIAGNOSIS — Z85118 Personal history of other malignant neoplasm of bronchus and lung: Secondary | ICD-10-CM | POA: Insufficient documentation

## 2016-06-06 DIAGNOSIS — M542 Cervicalgia: Secondary | ICD-10-CM | POA: Insufficient documentation

## 2016-06-06 DIAGNOSIS — R070 Pain in throat: Secondary | ICD-10-CM

## 2016-06-06 DIAGNOSIS — Z87891 Personal history of nicotine dependence: Secondary | ICD-10-CM | POA: Insufficient documentation

## 2016-06-06 DIAGNOSIS — I2510 Atherosclerotic heart disease of native coronary artery without angina pectoris: Secondary | ICD-10-CM | POA: Diagnosis not present

## 2016-06-06 DIAGNOSIS — R079 Chest pain, unspecified: Secondary | ICD-10-CM | POA: Diagnosis not present

## 2016-06-06 LAB — BASIC METABOLIC PANEL
ANION GAP: 7 (ref 5–15)
BUN: 6 mg/dL (ref 6–20)
CHLORIDE: 105 mmol/L (ref 101–111)
CO2: 26 mmol/L (ref 22–32)
Calcium: 9.4 mg/dL (ref 8.9–10.3)
Creatinine, Ser: 0.66 mg/dL (ref 0.44–1.00)
GFR calc Af Amer: >60 mL/min (ref >60)
Glucose, Bld: 103 mg/dL — ABNORMAL HIGH (ref 65–99)
POTASSIUM: 3.6 mmol/L (ref 3.5–5.1)
SODIUM: 138 mmol/L (ref 135–145)

## 2016-06-06 LAB — CBC
HCT: 38.2 % (ref 35.0–47.0)
HEMOGLOBIN: 12.5 g/dL (ref 12.0–16.0)
MCH: 29.1 pg (ref 26.0–34.0)
MCHC: 32.7 g/dL (ref 32.0–36.0)
MCV: 89.1 fL (ref 80.0–100.0)
PLATELETS: 521 10*3/uL — AB (ref 150–440)
RBC: 4.29 MIL/uL (ref 3.80–5.20)
RDW: 12.6 % (ref 11.5–14.5)
WBC: 10.7 10*3/uL (ref 3.6–11.0)

## 2016-06-06 LAB — TROPONIN I: Troponin I: <0.03 ng/mL (ref <0.03)

## 2016-06-06 MED ORDER — NITROGLYCERIN 0.4 MG SL SUBL
0.4000 mg | SUBLINGUAL_TABLET | SUBLINGUAL | Status: DC | PRN
  Administered 2016-06-06: 0.4 mg via SUBLINGUAL
  Filled 2016-06-06: qty 1

## 2016-06-06 MED ORDER — FENTANYL CITRATE (PF) 100 MCG/2ML IJ SOLN
50.0000 ug | Freq: Once | INTRAMUSCULAR | Status: AC
  Administered 2016-06-06: 50 ug via INTRAVENOUS
  Filled 2016-06-06: qty 2

## 2016-06-06 MED ORDER — GI COCKTAIL ~~LOC~~
30.0000 mL | Freq: Once | ORAL | Status: AC
  Administered 2016-06-06: 30 mL via ORAL
  Filled 2016-06-06: qty 30

## 2016-06-06 MED ORDER — IOPAMIDOL (ISOVUE-370) INJECTION 76%
100.0000 mL | Freq: Once | INTRAVENOUS | Status: AC | PRN
  Administered 2016-06-06: 100 mL via INTRAVENOUS

## 2016-06-06 MED ORDER — MORPHINE SULFATE (PF) 2 MG/ML IV SOLN: 4.0000 mg | Freq: Once | INTRAVENOUS | Status: DC

## 2016-06-06 MED ORDER — DEXAMETHASONE SODIUM PHOSPHATE 10 MG/ML IJ SOLN
10.0000 mg | Freq: Once | INTRAMUSCULAR | Status: AC
  Administered 2016-06-06: 10 mg via INTRAVENOUS
  Filled 2016-06-06: qty 1

## 2016-06-06 MED ORDER — OXYCODONE-ACETAMINOPHEN 5-325 MG PO TABS
1.0000 | ORAL_TABLET | Freq: Once | ORAL | Status: AC
  Administered 2016-06-06: 1 via ORAL
  Filled 2016-06-06: qty 1

## 2016-06-06 MED ORDER — NITROGLYCERIN 0.4 MG SL SUBL: 0.4000 mg | SUBLINGUAL_TABLET | SUBLINGUAL | Status: DC | PRN

## 2016-06-06 MED ORDER — ALBUTEROL SULFATE (2.5 MG/3ML) 0.083% IN NEBU
2.5000 mg | INHALATION_SOLUTION | Freq: Once | RESPIRATORY_TRACT | Status: AC
  Administered 2016-06-06: 2.5 mg via RESPIRATORY_TRACT
  Filled 2016-06-06: qty 3

## 2016-06-06 NOTE — ED Provider Notes (Signed)
-----------------------------------------   8:30 PM on 06/06/2016 -----------------------------------------   Blood pressure 130/70, pulse 63, temperature 97.8 F (36.6 C), temperature source Oral, resp. rate 20, height '5\' 5"'$  (1.651 m), weight 112 lb (50.8 kg), SpO2 100 %.  Assuming care from Dr. Mariea Clonts of Lorayne Marek is a 60 y.o. female with a chief complaint of Chest Pain .    In summary, 60 y.o. female with a hx of lung cancer status post left lower lobectomy 9/18, endoscopy 06/02/16, CAD w/ recent cardiac cath w/o reportedly no sign disease, hx of PTX, presenting with acute onset of neck pain.   The current plan of care is to f/u results of CT neck and chest to eval for evidence of dissection.  _________________________ 11:11 PM on 06/06/2016 -----------------------------------------  CT negative for dissection or any other acute findings. I reevaluated the patient who tells me that she was visiting with a friend, sitting and talking when she developed sudden onset of severe anterior neck pain. She points to the area between her clavicles on the center of her chest and the base of her neck. She tells me that she feels like she can't take a breath in. When she inspires she feels pain like there is an obstruction in her throat. She says that she can exhale without any difficulty and she is denying any chest pain. She also denies any hives, allergic reaction. She denies ever having anything similar in the past. Patient is day 3 from EGD and colonoscopy. Patient could have some throat/esophageal irritation from endoscopy.  Her exam is completely benign, tongue is normal size, uvula is midline, airway is patent, palpation of the neck shows no masses, patient has no stridor, lungs are clear to auscultation, she is neurologically intact otherwise. Labs WNL, second troponin pending.  Will give her albuterol, decadron and GI cocktail and also re-dose her  morphine.  _________________________ 11:55 PM on 06/06/2016 -----------------------------------------  Patient reports that she feels better. I reviewed and see substance controlled database and patient has had 6 prescriptions for narcotic pain medication over the course of the last month. We'll not provide patient with narcotic medication at this time.     Rudene Re, MD 06/06/16 [id number]

## 2016-06-06 NOTE — ED Provider Notes (Signed)
[facility] Emergency Department Provider Note  ____________________________________________  Time seen: Approximately 7:58 PM  I have reviewed the triage vital signs and the nursing notes.   HISTORY  Chief Complaint Chest Pain    HPI Sheila Suarez is a 60 y.o. female with a hx of lung cancer status post left lower lobectomy 9/18, endoscopy 06/02/16, CAD w/ recent cardiac cath w/o reportedly no sign disease, hx of PTX, presenting with acute onset of neck pain. The patient reports that she was visiting with her cousin, and while seated develop acute onset of severe anterior neck pain. On arrival to the emergency department, the patient is uncomfortable and having difficulty focusing on my examination but when prompted she states that the pain starts between the clavicles and radiates upwards.  She denies any headache, visual changes, nausea or vomiting, difficulty swallowing or breathing, palpitations, lightheadedness or syncope. She was given 4 aspirin in route by EMS.   Past Medical History:  Diagnosis Date  . Cancer ([place])    Lung  . Coronary artery disease   . Lupus   . Shortness of breath dyspnea     Patient Active Problem List   Diagnosis Date Noted  . Abdominal pain, left upper quadrant   . Special screening for malignant neoplasms, colon   . Pneumothorax 04/28/2016  . Lung mass 04/17/2016  . Bradycardia 03/20/2016  . CAD (coronary artery disease) 02/28/2016  . Shortness of breath 02/28/2016  . Lung cancer ([place]) 02/21/2016  . Lupus anticoagulant positive 02/21/2016  . Weight loss 02/09/2016  . Nodule of left lung 02/07/2016    Past Surgical History:  Procedure Laterality Date  . COLONOSCOPY WITH PROPOFOL N/A 06/02/2016   Procedure: COLONOSCOPY WITH PROPOFOL;  Surgeon: [name], MD;  Location: [facility] ENDOSCOPY;  Service: Gastroenterology;  Laterality: N/A;  . CYST EXCISION Left    hand  . ESOPHAGOGASTRODUODENOSCOPY (EGD) WITH PROPOFOL N/A  06/02/2016   Procedure: ESOPHAGOGASTRODUODENOSCOPY (EGD) WITH PROPOFOL;  Surgeon: [name], MD;  Location: [facility] ENDOSCOPY;  Service: Gastroenterology;  Laterality: N/A;  . FLEXIBLE BRONCHOSCOPY N/A 04/17/2016   Procedure: FLEXIBLE BRONCHOSCOPY;  Surgeon: [name], MD;  Location: [facility] ORS;  Service: Thoracic;  Laterality: N/A;  . LUNG REMOVAL, PARTIAL Left    Left lower lobe  . THORACOTOMY Left 04/17/2016   Procedure: THORACOTOMY LEFT UPPER LOBE LUNG RESECTION;  Surgeon: [name], MD;  Location: [facility] ORS;  Service: Thoracic;  Laterality: Left;  . TUBAL LIGATION Bilateral    37 years ago    Current Outpatient Rx  . Order #: [id number] Class: Historical Med  . Order #: [id number] Class: Historical Med  . Order #: [id number] Class: Print  . Order #: [id number] Class: Print  . Order #: [id number] Class: Print  . Order #: [id number] Class: Print  . Order #: [id number] Class: Normal    Allergies Patient has no known allergies.  Family History  Problem Relation Age of Onset  . Pancreatic cancer Sister   . Lung cancer Sister   . Lung cancer Brother   . Stomach cancer Paternal Grandmother   . Heart disease Father   . Cancer Daughter 63    cervical  . Cancer Maternal Aunt     brain  . Cancer Maternal Uncle     prostate  . Cancer Maternal Aunt     bone    Social History Social History  Substance Use Topics  . Smoking status: Former Smoker    Packs/day: 0.25    Years: 35.00    Types: Cigarettes  Quit date: 03/19/2016  . Smokeless tobacco: Former Systems developer    Quit date: 03/16/2016  . Alcohol use No     Comment: Rarely    Review of Systems Constitutional: No fever/chills. Eyes: No visual changes. ENT: No sore throat.Positive neck pain. No congestion or rhinorrhea. Cardiovascular: Positive upper chest pain. Denies palpitations. Respiratory: Denies shortness of breath.  No cough. Gastrointestinal: No abdominal pain.  No nausea, no vomiting.  No diarrhea.  No  constipation. Genitourinary: Negative for dysuria. Musculoskeletal: Negative for back pain. Skin: Negative for rash. Neurological: Negative for headaches. No focal numbness, tingling or weakness.   10-point ROS otherwise negative.  ____________________________________________   PHYSICAL EXAM:  VITAL SIGNS: ED Triage Vitals  Enc Vitals Group     BP 06/06/16 1951 (!) 160/97     Pulse Rate 06/06/16 1951 78     Resp 06/06/16 1951 (!) 22     Temp 06/06/16 1951 97.8 F (36.6 C)     Temp Source 06/06/16 1951 Oral     SpO2 06/06/16 1951 100 %     Weight 06/06/16 1954 112 lb (50.8 kg)     Height 06/06/16 1954 '5\' 5"'$  (1.651 m)     Head Circumference --      Peak Flow --      Pain Score 06/06/16 1955 8     Pain Loc --      Pain Edu? --      Excl. in [place]? --     Constitutional: Patient is alert oriented and able to answer questions but she is uncomfortable, grabbing at her neck, and has difficulty focusing on my examination and questioning. She is protecting her airway and GCS is 15. Eyes: Conjunctivae are normal.  EOMI. No scleral icterus. Head: Atraumatic. Nose: No congestion/rhinnorhea. Mouth/Throat: Mucous membranes are moist.  Neck: No stridor.  Supple.  No posterior or midline C-spine tenderness to palpation, step-offs or deformities. Positive JVD. No meningismus. No crepitus in the neck. No instability over the clavicles bilaterally. Cardiovascular: Normal rate, regular rhythm. No murmurs, rubs or gallops.  Respiratory: Normal respiratory effort.  No accessory muscle use or retractions. Lungs CTAB.  No wheezes, rales or ronchi. Gastrointestinal: Soft, nontender and nondistended.  No guarding or rebound.  No peritoneal signs. Musculoskeletal: No LE edema. No ttp in the calves or palpable cords.  Negative Homan's sign. Neurologic:  A&Ox3.  Speech is clear.  Face and smile are symmetric.  EOMI.  PERRLA.  Moves all extremities well. Skin:  Skin is warm, dry and intact. No rash  noted. Psychiatric: Mood and affect are normal. Speech and behavior are normal.  Normal judgement.  ____________________________________________   LABS (all labs ordered are listed, but only abnormal results are displayed)  Labs Reviewed  CBC  BASIC METABOLIC PANEL  TROPONIN I   ____________________________________________  EKG  ED ECG REPORT I, Eula Listen, the attending physician, personally viewed and interpreted this ECG.   Date: 06/06/2016  EKG Time: 1952  Rate: 75  Rhythm: normal sinus rhythm  Axis: normal  Intervals:none  ST&T Change: The patient has a difficult baseline tracing because she is unable to sit still during the EKG. She does not have any obvious ST elevation, or other ischemic changes.  ____________________________________________  RADIOLOGY  No results found.  ____________________________________________   PROCEDURES  Procedure(s) performed: None  Procedures  Critical Care performed: No ____________________________________________   INITIAL IMPRESSION / ASSESSMENT AND PLAN / ED COURSE  Pertinent labs & imaging results that were available during  my care of the patient were reviewed by me and considered in my medical decision making (see chart for details).  60 y.o. female with a history of lung cancer, previous pneumothorax, presenting with acute onset of neck pain. The patient is mildly hypertensive but otherwise has reassuring vital signs. She has no neurologic findings on my examination. She has no abnormal cardiopulmonary findings. However, the patient is very uncomfortable. I am concerned about thoracic aortic dissection, or possible carotid artery dissection, and we'll get CTs to evaluate for both of these. PE is also possible, given her cancer history and the fact that the pain is worse with deep breaths, but it would be unusual for this to give the patient pain. Additionally, her oxygen saturations are reassuring and she has no  evidence of lower extremity DVT. We will also evaluate the patient for acute MI. I will attempt to treat the patient with narcotics until we are able to further elucidate her clinical status.  The patient will be signed out to Dr. Alfred Levins for further evaluation and treatment.  ____________________________________________  FINAL CLINICAL IMPRESSION(S) / ED DIAGNOSES  Final diagnoses:  Neck pain  Chest pain  Neck pain  Chest pain    Clinical Course       NEW MEDICATIONS STARTED DURING THIS VISIT:  New Prescriptions   No medications on file      Eula Listen, MD 06/06/16 2020

## 2016-06-06 NOTE — ED Triage Notes (Addendum)
Per EMS report, patient c/o upper central chest pain at the base of throat that began at 1600 today while seated. Patient states pain is worse with inspiration. Patient had a recent cardiac catherization with no stents placed. Patient had LUL lobectomy in 03/2016 per EMS report. EMS administered 4 ASA '81mg'$  enroute.

## 2016-06-06 NOTE — ED Notes (Signed)
Report given to Andrea RN

## 2016-06-07 ENCOUNTER — Telehealth: Payer: Self-pay | Admitting: Cardiothoracic Surgery

## 2016-06-07 ENCOUNTER — Telehealth: Payer: Self-pay

## 2016-06-07 MED ORDER — GABAPENTIN 100 MG PO CAPS: 100.0000 mg | ORAL_CAPSULE | Freq: Three times a day (TID) | ORAL | 0 refills | Status: DC

## 2016-06-07 NOTE — Telephone Encounter (Signed)
Received refill notification for Gabapentin for Dr. Genevive Bi. Medication refill sent to Pharmacy at this time.

## 2016-06-07 NOTE — ED Notes (Signed)
Attempted to call taxi with no answer.

## 2016-06-07 NOTE — Telephone Encounter (Signed)
A referral has been sent to [facility] for post operative pain control. [facility] is not accepting new patient's at this time.   When I spoke with the receptionist at [facility], she states that the patient will receive a phone call for an appointment within 5 business days.   I will follow up and make sure appointment is made to [facility].

## 2016-06-07 NOTE — Telephone Encounter (Signed)
Thank you so much

## 2016-06-13 ENCOUNTER — Ambulatory Visit: Admission: RE | Admit: 2016-06-13 | Payer: BLUE CROSS/BLUE SHIELD | Source: Ambulatory Visit

## 2016-06-15 NOTE — Telephone Encounter (Signed)
I have called [facility] to follow up on the referral that was sent on 06/07/16. I spoke with Anderson Malta, the receptionist. She states that the referral has not been reviewed at this time. That the patient's referral was the next to be reviewed. She states that the referral coordinator was out the last two days and will return to work today. I advised then that I was told this would only take 3-5 business days. She stated that this should be taken care of no later than Monday of next week. I stated that I would call back before the end of the week to follow up on this.

## 2016-06-16 ENCOUNTER — Other Ambulatory Visit: Payer: Self-pay

## 2016-06-16 ENCOUNTER — Telehealth: Payer: Self-pay

## 2016-06-16 MED ORDER — CLARITHROMYCIN 500 MG PO TABS: 500.0000 mg | ORAL_TABLET | Freq: Two times a day (BID) | ORAL | 0 refills | Status: AC

## 2016-06-16 MED ORDER — AMOXICILLIN 500 MG PO CAPS: 1000.0000 mg | ORAL_CAPSULE | Freq: Three times a day (TID) | ORAL | 0 refills | Status: AC

## 2016-06-16 MED ORDER — OMEPRAZOLE 20 MG PO CPDR: 20.0000 mg | DELAYED_RELEASE_CAPSULE | Freq: Two times a day (BID) | ORAL | 0 refills | Status: DC

## 2016-06-16 NOTE — Telephone Encounter (Signed)
Pt notified of procedure results and rx. This has been sent to pt's pharmacy today. Pt advised we will need to recheck her stool in 6 weeks after completing treatment.

## 2016-06-16 NOTE — Telephone Encounter (Signed)
-----   Message from [name], MD sent at 06/14/2016  9:07 AM EST ----- She has H pylori gastritis, Suggest clarithromycin 500 mg PO BID, amoxicillin 1 gram TID, omeprazole 20 mg BID all for 14 days.  Check for allergies to penicillin

## 2016-06-16 NOTE — Telephone Encounter (Signed)
We have received a fax from [facility] stating that they have contacted the patient to make an appointment and the patient did not wish to undergo the evaluation at this time.   This has been sent to be scanned into media.

## 2016-06-18 ENCOUNTER — Encounter: Payer: Self-pay | Admitting: Hematology and Oncology

## 2016-06-18 NOTE — Progress Notes (Signed)
[facility] day:  06/19/16  Chief Complaint: Jaedah Lords is a 60 y.o. female with lupus s/p left upper lobe lobectomy who is seen for 1 month assessment.  HPI: The patient was last seen in the medical oncology clinic on 05/23/2016.  At that time, she was seen for assessment after interval left upper lobe lobectomy on 04/17/2016.  Pathology revealed no evidence of malignancy.  She had pain since her chest tube was removed.  She was seen by Dr. Faith Rogue on 05/26/2016.  The etiology of her pain was unknown.  A repeat CT scan was planned in 3 months.  She was given an Rx for Percocet.  An appointment was made for the pain clinic.  She underwent EGD and colonoscopy on 06/02/2016 by Dr. [name] and Dr Tomma Rakers.  EGD and colonoscopy were normal.  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  There was no dysplasia or malignancy.  She was seen in the ER on 06/06/2016 with severe anterior neck pain.  Chest and neck CT angiogram revealed no acute vascular abnormality.  There was significant venous collateralization in the chest wall secondary to an area of narrowing in the left innominate.  There was no evidence of pulmonary emboli or aortic dissection.  He was treated with morphine, albuterol, Decadron and a GI cocktail.  Symptomatically, the patient notes her pain is "no better". She is not going to the pain clinic.  She started treatment for her H. pylori on 06/17/2016.   Past Medical History:  Diagnosis Date  . Cancer ([place])    Lung  . Coronary artery disease   . Lupus   . Shortness of breath dyspnea     Past Surgical History:  Procedure Laterality Date  . COLONOSCOPY WITH PROPOFOL N/A 06/02/2016   Procedure: COLONOSCOPY WITH PROPOFOL;  Surgeon: [name], MD;  Location: [facility] ENDOSCOPY;  Service: Gastroenterology;  Laterality: N/A;  . CYST EXCISION Left    hand  . ESOPHAGOGASTRODUODENOSCOPY (EGD)  WITH PROPOFOL N/A 06/02/2016   Procedure: ESOPHAGOGASTRODUODENOSCOPY (EGD) WITH PROPOFOL;  Surgeon: [name], MD;  Location: [facility] ENDOSCOPY;  Service: Gastroenterology;  Laterality: N/A;  . FLEXIBLE BRONCHOSCOPY N/A 04/17/2016   Procedure: FLEXIBLE BRONCHOSCOPY;  Surgeon: [name], MD;  Location: [facility] ORS;  Service: Thoracic;  Laterality: N/A;  . LUNG REMOVAL, PARTIAL Left    Left lower lobe  . THORACOTOMY Left 04/17/2016   Procedure: THORACOTOMY LEFT UPPER LOBE LUNG RESECTION;  Surgeon: [name], MD;  Location: [facility] ORS;  Service: Thoracic;  Laterality: Left;  . TUBAL LIGATION Bilateral    37 years ago    Family History  Problem Relation Age of Onset  . Pancreatic cancer Sister   . Lung cancer Sister   . Lung cancer Brother   . Stomach cancer Paternal Grandmother   . Heart disease Father   . Cancer Daughter 18    cervical  . Cancer Maternal Aunt     brain  . Cancer Maternal Uncle     prostate  . Cancer Maternal Aunt     bone    Social History:  reports that she quit smoking about 3 months ago. Her smoking use included Cigarettes. She has a 8.75 pack-year smoking history. She quit smokeless tobacco use about 3 months ago. She reports that she does not drink alcohol or use drugs.  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  The patient is alone today.  Allergies: No Known Allergies  Current Medications: Current Outpatient Prescriptions  Medication Sig Dispense Refill  . amoxicillin (AMOXIL) 500 MG capsule Take 2 capsules (1,000 mg total) by mouth 3 (three) times daily. 84 capsule 0  . clarithromycin (BIAXIN) 500 MG tablet Take 1 tablet (500 mg total) by mouth 2 (two) times daily. 28 tablet 0  . omeprazole (PRILOSEC) 20 MG capsule Take 1 capsule (20 mg total) by mouth 2 (two) times daily before a meal. 28 capsule 0  . oxyCODONE-acetaminophen (PERCOCET/ROXICET) 5-325 MG tablet Take 1-2 tablets by mouth every 4 (four) hours as needed for moderate pain. 40 tablet 0  .  sucralfate (CARAFATE) 1 g tablet Take 1 tablet (1 g total) by mouth 4 (four) times daily. 60 tablet 0  . traMADol (ULTRAM) 50 MG tablet Take 2 tablets (100 mg total) by mouth every 6 (six) hours. 60 tablet 0  . gabapentin (NEURONTIN) 100 MG capsule Take 1 capsule (100 mg total) by mouth 3 (three) times daily. (Patient not taking: Reported on 06/19/2016) 45 capsule 0  . ranitidine (ZANTAC) 150 MG tablet Take 150 mg by mouth 2 (two) times daily.  1   No current facility-administered medications for this visit.     Review of Systems:  GENERAL:  Feels "the same".  No fevers.  Weight up 2 pounds. PERFORMANCE STATUS (ECOG):  1 HEENT:  Poor vision (near and far).  Needs glasses.  No runny nose, sore throat, mouth sores or tenderness. Lungs: Shortness of breath with exertion.  Chest pain at site of chest tube pull (no change).  No cough.  No hemoptysis. Cardiac:  No chest pain, palpitations, orthopnea, or PND. GI:  H pylori on treatment.  No appetite.  Constipation.  No nausea, vomiting, diarrhea, melena or hematochezia. GU:  No urgency, frequency, dysuria, or hematuria. Musculoskeletal:  No back pain.  No joint pain.  No muscle tenderness. Extremities:  No pain or swelling. Skin:  No rashes or skin changes. Neuro:  No headache, numbness or weakness, balance or coordination issues. Endocrine:  No diabetes, thyroid issues, hot flashes or night sweats. Psych:  Poor sleep.  No mood changes, depression or anxiety. Pain:  Pain at old chest tube site. Review of systems:  All other systems reviewed and found to be negative.  Physical Exam: Blood pressure 130/77, pulse 69, temperature (!) 96 F (35.6 C), temperature source Tympanic, resp. rate 18, weight 117 lb 11.6 oz (53.4 kg). GENERAL:  Thin woman sitting in the exam room in no acute distress. MENTAL STATUS:  Alert and oriented to person, place and time. HEAD:  Braided brown hair with graying.  Normocephalic, atraumatic, face symmetric, no  Cushingoid features. EYES:  Glasses.  Brown eyes.  Pupils equal round and reactive to light and accomodation.  No conjunctivitis or scleral icterus. ENT:  Oropharynx clear without lesion.  Dentures.  Tongue normal. Mucous membranes moist.  RESPIRATORY:  Bilateral breath sounds.  Clear to auscultation without rales, wheezes or rhonchi. CARDIOVASCULAR:  Regular rate and rhythm without murmur, rub or gallop. CHEST WALL:  Incision well healed. Non-tender to palpation. ABDOMEN:  Soft, non-tender, with active bowel sounds, and no hepatosplenomegaly.  No masses. SKIN:  No rashes, ulcers or lesions. EXTREMITIES: No edema, skin discoloration or tenderness.  No palpable cords. LYMPH NODES: No palpable cervical, supraclavicular, axillary or inguinal adenopathy  NEUROLOGICAL: Unremarkable. PSYCH:  Appropriate.   Appointment on 06/19/2016  Component Date Value Ref Range Status  . WBC 06/19/2016 7.3  3.6 - 11.0 K/uL Final  . RBC  06/19/2016 3.95  3.80 - 5.20 MIL/uL Final  . Hemoglobin 06/19/2016 11.5* 12.0 - 16.0 g/dL Final  . HCT 06/19/2016 35.3  35.0 - 47.0 % Final  . MCV 06/19/2016 89.4  80.0 - 100.0 fL Final  . MCH 06/19/2016 29.2  26.0 - 34.0 pg Final  . MCHC 06/19/2016 32.6  32.0 - 36.0 g/dL Final  . RDW 06/19/2016 13.0  11.5 - 14.5 % Final  . Platelets 06/19/2016 428  150 - 440 K/uL Final  . Neutrophils Relative % 06/19/2016 60  % Final  . Neutro Abs 06/19/2016 4.4  1.4 - 6.5 K/uL Final  . Lymphocytes Relative 06/19/2016 31  % Final  . Lymphs Abs 06/19/2016 2.3  1.0 - 3.6 K/uL Final  . Monocytes Relative 06/19/2016 8  % Final  . Monocytes Absolute 06/19/2016 0.6  0.2 - 0.9 K/uL Final  . Eosinophils Relative 06/19/2016 1  % Final  . Eosinophils Absolute 06/19/2016 0.1  0 - 0.7 K/uL Final  . Basophils Relative 06/19/2016 0  % Final  . Basophils Absolute 06/19/2016 0.0  0 - 0.1 K/uL Final    Assessment:  Maleeka Sabatino is a 61 y.o. female with lupus x 8 years.  CT angiogram of the chest,  abdominal and pelvis on 02/17/2016 revealed a 1.1 cm spiculated lesion in the left upper lobe increased from prior exam (08/30/2010). There were stable small pulmonary nodules bilaterally (2-3 mm).  She has a 15 pack year smoking history.  PET scan on 02/15/2016 revealed an irregular 1.0 cm apical LUL pulmonary nodule (SUV 0.7).  There was a 4 mm right middle lobe solid pulmonary nodule (stable since 08/02/2010) and two additional right lower lobe 3 mm pulmonary nodules.  PFTs revealed an FEV1 was 84% and her DLCO was 67%.  She underwent left thoracotomy with left upper lobe lobectomy on 04/17/2016.  Pathology revealed subpleural fibro-elastotic scar consistent with benign pulmonary cap.  There was no evidence of malignancy.  There were 2 benign lymph nodes.  She had a pneumothorax requiring chest tube placement.  She has received the series of rabies injections after being bitten by a stray dog.   EGD and colonoscopy were normal on 06/02/2016.  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  There was no dysplasia or malignancy.  She began treatment for H pylori on 06/17/2016.  She notes a history of 16 pound weight loss over an unclear period of time.  Her weight has been stable since 02/09/2016.  TSH was normal on 02/09/2016.  Symptomatically, she has ongoing pain where her chest tube was pulled.  Weight is up 2 pounds.  Plan: 1.  Labs today:  CBC with diff, CMP. 2.  Complete treatment for H pylori. 3.  RTC 3 months for MD assessment and labs (CBC with diff, CMP).   Lequita Asal, MD  06/19/2016, 11:04 AM

## 2016-06-19 ENCOUNTER — Encounter: Payer: Self-pay | Admitting: Cardiothoracic Surgery

## 2016-06-19 ENCOUNTER — Inpatient Hospital Stay (HOSPITAL_BASED_OUTPATIENT_CLINIC_OR_DEPARTMENT_OTHER): Payer: BLUE CROSS/BLUE SHIELD | Admitting: Hematology and Oncology

## 2016-06-19 ENCOUNTER — Ambulatory Visit (INDEPENDENT_AMBULATORY_CARE_PROVIDER_SITE_OTHER): Payer: BLUE CROSS/BLUE SHIELD | Admitting: Cardiothoracic Surgery

## 2016-06-19 ENCOUNTER — Other Ambulatory Visit: Payer: Self-pay | Admitting: Cardiothoracic Surgery

## 2016-06-19 ENCOUNTER — Inpatient Hospital Stay: Payer: BLUE CROSS/BLUE SHIELD | Attending: Hematology and Oncology

## 2016-06-19 VITALS — BP 130/77 | HR 69 | Temp 96.0°F | Resp 18 | Wt 117.7 lb

## 2016-06-19 VITALS — BP 166/75 | HR 73 | Temp 98.7°F | Wt 118.0 lb

## 2016-06-19 DIAGNOSIS — Z800 Family history of malignant neoplasm of digestive organs: Secondary | ICD-10-CM | POA: Insufficient documentation

## 2016-06-19 DIAGNOSIS — H547 Unspecified visual loss: Secondary | ICD-10-CM

## 2016-06-19 DIAGNOSIS — R0609 Other forms of dyspnea: Secondary | ICD-10-CM | POA: Insufficient documentation

## 2016-06-19 DIAGNOSIS — I2510 Atherosclerotic heart disease of native coronary artery without angina pectoris: Secondary | ICD-10-CM | POA: Insufficient documentation

## 2016-06-19 DIAGNOSIS — M329 Systemic lupus erythematosus, unspecified: Secondary | ICD-10-CM

## 2016-06-19 DIAGNOSIS — Z801 Family history of malignant neoplasm of trachea, bronchus and lung: Secondary | ICD-10-CM | POA: Diagnosis not present

## 2016-06-19 DIAGNOSIS — G4700 Insomnia, unspecified: Secondary | ICD-10-CM

## 2016-06-19 DIAGNOSIS — Z808 Family history of malignant neoplasm of other organs or systems: Secondary | ICD-10-CM | POA: Insufficient documentation

## 2016-06-19 DIAGNOSIS — R911 Solitary pulmonary nodule: Secondary | ICD-10-CM

## 2016-06-19 DIAGNOSIS — B9681 Helicobacter pylori [H. pylori] as the cause of diseases classified elsewhere: Secondary | ICD-10-CM

## 2016-06-19 DIAGNOSIS — Z8042 Family history of malignant neoplasm of prostate: Secondary | ICD-10-CM | POA: Diagnosis not present

## 2016-06-19 DIAGNOSIS — Z79899 Other long term (current) drug therapy: Secondary | ICD-10-CM | POA: Insufficient documentation

## 2016-06-19 DIAGNOSIS — G8928 Other chronic postprocedural pain: Secondary | ICD-10-CM | POA: Diagnosis not present

## 2016-06-19 DIAGNOSIS — R918 Other nonspecific abnormal finding of lung field: Secondary | ICD-10-CM | POA: Insufficient documentation

## 2016-06-19 DIAGNOSIS — R634 Abnormal weight loss: Secondary | ICD-10-CM

## 2016-06-19 DIAGNOSIS — Z87891 Personal history of nicotine dependence: Secondary | ICD-10-CM | POA: Insufficient documentation

## 2016-06-19 DIAGNOSIS — G8929 Other chronic pain: Secondary | ICD-10-CM

## 2016-06-19 LAB — CBC WITH DIFFERENTIAL/PLATELET
Basophils Absolute: 0 10*3/uL (ref 0–0.1)
Basophils Relative: 0 %
Eosinophils Absolute: 0.1 10*3/uL (ref 0–0.7)
Eosinophils Relative: 1 %
HCT: 35.3 % (ref 35.0–47.0)
Hemoglobin: 11.5 g/dL — ABNORMAL LOW (ref 12.0–16.0)
Lymphocytes Relative: 31 %
Lymphs Abs: 2.3 10*3/uL (ref 1.0–3.6)
MCH: 29.2 pg (ref 26.0–34.0)
MCHC: 32.6 g/dL (ref 32.0–36.0)
MCV: 89.4 fL (ref 80.0–100.0)
Monocytes Absolute: 0.6 10*3/uL (ref 0.2–0.9)
Monocytes Relative: 8 %
Neutro Abs: 4.4 10*3/uL (ref 1.4–6.5)
Neutrophils Relative %: 60 %
Platelets: 428 10*3/uL (ref 150–440)
RBC: 3.95 MIL/uL (ref 3.80–5.20)
RDW: 13 % (ref 11.5–14.5)
WBC: 7.3 10*3/uL (ref 3.6–11.0)

## 2016-06-19 LAB — COMPREHENSIVE METABOLIC PANEL
ALT: 9 U/L — ABNORMAL LOW (ref 14–54)
AST: 11 U/L — ABNORMAL LOW (ref 15–41)
Albumin: 3.6 g/dL (ref 3.5–5.0)
Alkaline Phosphatase: 49 U/L (ref 38–126)
Anion gap: 6 (ref 5–15)
BUN: 9 mg/dL (ref 6–20)
CO2: 26 mmol/L (ref 22–32)
Calcium: 8.7 mg/dL — ABNORMAL LOW (ref 8.9–10.3)
Chloride: 110 mmol/L (ref 101–111)
Creatinine, Ser: 0.83 mg/dL (ref 0.44–1.00)
GFR calc Af Amer: >60 mL/min (ref >60)
GFR calc non Af Amer: >60 mL/min (ref >60)
Glucose, Bld: 83 mg/dL (ref 65–99)
Potassium: 4 mmol/L (ref 3.5–5.1)
Sodium: 142 mmol/L (ref 135–145)
Total Bilirubin: 0.2 mg/dL — ABNORMAL LOW (ref 0.3–1.2)
Total Protein: 6.7 g/dL (ref 6.5–8.1)

## 2016-06-19 NOTE — Patient Instructions (Signed)
Please give [place] a call if you have any questions or concerns.   Please call us if your pain medication doesn't work.

## 2016-06-19 NOTE — Progress Notes (Signed)
  Patient ID: Sheila Suarez, female   DOB: 30-Apr-1956, 60 y.o.   MRN: [id number]  HISTORY: Still complaining of pain lower aspect left chest wall.  "Feels like something under my ribs".  Went to ER about 2 weeks ago and had a CT of chest that was essentially unremarkable for identifying cause of pain.  Nothing makes it better though exertion makes it worse.  Referred to pain clinic but did not go as they were not accepting new patients.  Does not want to go to [facility] or [place].  Has H. Pylori and on therapy for that.   Vitals:   06/19/16 0832  BP: (!) 166/75  Pulse: 73  Temp: 98.7 F (37.1 C)    EXAM:  Resp: Lungs are clear bilaterally.  No respiratory distress, normal effort. Heart:  Regular without murmurs Abd:  Abdomen is soft, non distended and non tender. No masses are palpable.  There is no rebound and no guarding.  Neurological: Alert and oriented to person, place, and time. Coordination normal.  Skin: Skin is warm and dry. No rash noted. No diaphoretic. No erythema. No pallor. Incision well healed Psychiatric: Normal mood and affect. Normal behavior. Judgment and thought content normal.    ASSESSMENT: I did independently review the CT of the chest.  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   PLAN:   I asked her to increase Neurontin to 200 mg TID and gave her a presription for 200 tablets of 100 mg each I provided her with a prescription for Norco 5/325 one tab every 4 hours as needed disp 30 tab with no refill I will see back in 2 months She will see Dr. Mike Gip today.     [name], MD

## 2016-06-19 NOTE — Progress Notes (Signed)
Patient states she has no appetite.  Only has one BM every two weeks.  Exertional SOB.  Recently diagnosed with H. Pylori.  On two abx at this time. States she is not sleeping well.

## 2016-06-20 ENCOUNTER — Ambulatory Visit: Admit: 2016-06-20 | Payer: BLUE CROSS/BLUE SHIELD | Admitting: Gastroenterology

## 2016-06-20 SURGERY — COLONOSCOPY WITH PROPOFOL
Anesthesia: General

## 2016-06-27 ENCOUNTER — Ambulatory Visit (INDEPENDENT_AMBULATORY_CARE_PROVIDER_SITE_OTHER): Payer: BLUE CROSS/BLUE SHIELD | Admitting: Gastroenterology

## 2016-06-27 ENCOUNTER — Other Ambulatory Visit: Payer: Self-pay

## 2016-06-27 ENCOUNTER — Encounter: Payer: Self-pay | Admitting: Gastroenterology

## 2016-06-27 VITALS — BP 148/79 | HR 78 | Temp 98.4°F | Ht 65.0 in | Wt 116.0 lb

## 2016-06-27 DIAGNOSIS — R1012 Left upper quadrant pain: Secondary | ICD-10-CM | POA: Diagnosis not present

## 2016-06-27 DIAGNOSIS — A048 Other specified bacterial intestinal infections: Secondary | ICD-10-CM | POA: Diagnosis not present

## 2016-06-27 DIAGNOSIS — K581 Irritable bowel syndrome with constipation: Secondary | ICD-10-CM | POA: Insufficient documentation

## 2016-06-27 MED ORDER — LINACLOTIDE 145 MCG PO CAPS: 145.0000 ug | ORAL_CAPSULE | Freq: Every day | ORAL | 0 refills | Status: DC

## 2016-06-27 NOTE — Patient Instructions (Addendum)
Irritable Bowel Syndrome, Adult Irritable bowel syndrome (IBS) is not one specific disease. It is a group of symptoms that affects the organs responsible for digestion (gastrointestinal or GI tract). To regulate how your GI tract works, your body sends signals back and forth between your intestines and your brain. If you have IBS, there may be a problem with these signals. As a result, your GI tract does not function normally. Your intestines may become more sensitive and overreact to certain things. This is especially true when you eat certain foods or when you are under stress. There are four types of IBS. These may be determined based on the consistency of your stool:  IBS with diarrhea.  IBS with constipation.  Mixed IBS.  Unsubtyped IBS. It is important to know which type of IBS you have. Some treatments are more likely to be helpful for certain types of IBS. What are the causes? The exact cause of IBS is not known. What increases the risk? You may have a higher risk of IBS if:  You are a woman.  You are younger than 59 years old.  You have a family history of IBS.  You have mental health problems.  You have had bacterial infection of your GI tract. What are the signs or symptoms? Symptoms of IBS vary from person to person. The main symptom is abdominal pain or discomfort. Additional symptoms usually include one or more of the following:  Diarrhea, constipation, or both.  Abdominal swelling or bloating.  Feeling full or sick after eating a small or regular-size meal.  Frequent gas.  Mucus in the stool.  A feeling of having more stool left after a bowel movement. Symptoms tend to come and go. They may be associated with stress, psychiatric conditions, or nothing at all. How is this diagnosed? There is no specific test to diagnose IBS. Your health care provider will make a diagnosis based on a physical exam, medical history, and your symptoms. You may have other tests to  rule out other conditions that may be causing your symptoms. These may include:  Blood tests.  X-rays.  CT scan.  Endoscopy and colonoscopy. This is a test in which your GI tract is viewed with a long, thin, flexible tube. How is this treated? There is no cure for IBS, but treatment can help relieve symptoms. IBS treatment often includes:  Changes to your diet, such as:  Eating more fiber.  Avoiding foods that cause symptoms.  Drinking more water.  Eating regular, medium-sized portioned meals.  Medicines. These may include:  Fiber supplements if you have constipation.  Medicine to control diarrhea (antidiarrheal medicines).  Medicine to help control muscle spasms in your GI tract (antispasmodic medicines).  Medicines to help with any mental health issues, such as antidepressants or tranquilizers.  Therapy.  Talk therapy may help with anxiety, depression, or other mental health issues that can make IBS symptoms worse.  Stress reduction.  Managing your stress can help keep symptoms under control. Follow these instructions at home:  Take medicines only as directed by your health care provider.  Eat a healthy diet.  Avoid foods and drinks with added sugar.  Include more whole grains, fruits, and vegetables gradually into your diet. This may be especially helpful if you have IBS with constipation.  Avoid any foods and drinks that make your symptoms worse. These may include dairy products and caffeinated or carbonated drinks.  Do not eat large meals.  Drink enough fluid to keep your urine   clear or pale yellow.  Exercise regularly. Ask your health care provider for recommendations of good activities for you.  Keep all follow-up visits as directed by your health care provider. This is important. Contact a health care provider if:  You have constant pain.  You have trouble or pain with swallowing.  You have worsening diarrhea. Get help right away if:  You  have severe and worsening abdominal pain.  You have diarrhea and:  You have a rash, stiff neck, or severe headache.  You are irritable, sleepy, or difficult to awaken.  You are weak, dizzy, or extremely thirsty.  You have bright red blood in your stool or you have black tarry stools.  You have unusual abdominal swelling that is painful.  You vomit continuously.  You vomit blood (hematemesis).  You have both abdominal pain and a fever. This information is not intended to replace advice given to you by your health care provider. Make sure you discuss any questions you have with your health care provider. Document Released: 07/17/2005 Document Revised: 12/17/2015 Document Reviewed: 04/03/2014 Elsevier Interactive Patient Education  2017 [place].  High-Fiber Diet Fiber, also called dietary fiber, is a type of carbohydrate found in fruits, vegetables, whole grains, and beans. A high-fiber diet can have many health benefits. Your health care provider may recommend a high-fiber diet to help:  Prevent constipation. Fiber can make your bowel movements more regular.  Lower your cholesterol.  Relieve hemorrhoids, uncomplicated diverticulosis, or irritable bowel syndrome.  Prevent overeating as part of a weight-loss plan.  Prevent heart disease, type 2 diabetes, and certain cancers. What is my plan? The recommended daily intake of fiber includes:  38 grams for men under age 9.  74 grams for men over age 47.  45 grams for women under age 40.  46 grams for women over age 10. You can get the recommended daily intake of dietary fiber by eating a variety of fruits, vegetables, grains, and beans. Your health care provider may also recommend a fiber supplement if it is not possible to get enough fiber through your diet. What do I need to know about a high-fiber diet?  Fiber supplements have not been widely studied for their effectiveness, so it is better to get fiber through food  sources.  Always check the fiber content on thenutrition facts label of any prepackaged food. Look for foods that contain at least 5 grams of fiber per serving.  Ask your dietitian if you have questions about specific foods that are related to your condition, especially if those foods are not listed in the following section.  Increase your daily fiber consumption gradually. Increasing your intake of dietary fiber too quickly may cause bloating, cramping, or gas.  Drink plenty of water. Water helps you to digest fiber. What foods can I eat? Grains  Whole-grain breads. Multigrain cereal. Oats and oatmeal. Brown rice. Barley. Bulgur wheat. [place]. Bran muffins. Popcorn. Rye wafer crackers. Vegetables  Sweet potatoes. Spinach. Kale. Artichokes. Cabbage. Broccoli. Green peas. Carrots. Squash. Fruits  Berries. Pears. Apples. Oranges. Avocados. Prunes and raisins. Dried figs. Meats and Other Protein Sources  [place], kidney, pinto, and soy beans. Split peas. Lentils. Nuts and seeds. Dairy  Fiber-fortified yogurt. Beverages  Fiber-fortified soy milk. Fiber-fortified orange juice. Other  Fiber bars. The items listed above may not be a complete list of recommended foods or beverages. Contact your dietitian for more options.  What foods are not recommended? Grains  White bread. Pasta made with refined flour.  White rice. Vegetables  Fried potatoes. Canned vegetables. Well-cooked vegetables. Fruits  Fruit juice. Cooked, strained fruit. Meats and Other Protein Sources  Fatty cuts of meat. Fried Sales executive or fried fish. Dairy  Milk. Yogurt. Cream cheese. Sour cream. Beverages  Soft drinks. Other  Cakes and pastries. Butter and oils. The items listed above may not be a complete list of foods and beverages to avoid. Contact your dietitian for more information.  What are some tips for including high-fiber foods in my diet?  Eat a wide variety of high-fiber foods.  Make sure that half of all  grains consumed each day are whole grains.  Replace breads and cereals made from refined flour or white flour with whole-grain breads and cereals.  Replace white rice with brown rice, bulgur wheat, or millet.  Start the day with a breakfast that is high in fiber, such as a cereal that contains at least 5 grams of fiber per serving.  Use beans in place of meat in soups, salads, or pasta.  Eat high-fiber snacks, such as berries, raw vegetables, nuts, or popcorn. This information is not intended to replace advice given to you by your health care provider. Make sure you discuss any questions you have with your health care provider. Document Released: 07/17/2005 Document Revised: 12/23/2015 Document Reviewed: 12/30/2013 Elsevier Interactive Patient Education  2017 [employer].

## 2016-06-27 NOTE — Progress Notes (Signed)
Primary Care Physician: Kamas  Primary Gastroenterologist:  Dr. [name]   Chief Complaint  Patient presents with  . Follow up procedure results    HPI: Sheila Suarez is a 60 y.o. female here to follow up to her recent endoscopic procedures which were performed for evaluation of abdominal pain.   She had a normal colonoscopy on 06/02/2016 and an EGD which appeared normal but biopsies taken showed active chronic gastritis with H pylori .triple therapy prescribed on 06/16/16 .   She subsequently presented to the ER on 06/06/2016 for chest pain , given albuterol, decadron and GI cocktail which helped her and was discharged. Also noted she had 6 prescriptions of narcotic pain medications over the last month and hence none further was provided.   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 She has a 16 pound weight loss previously that has been stable.   She just completed her antibiotics a few days back .   Abdominal pain: Onset: Many years, not changed recently  Site :upper abdomen , constant pain since her lung surgery, pain is all day long , has pain in her left side of her chest as well .  Radiation: localized  Petra Kuba of pain: sharp  Aggravating factors: not worse with eating , worse when she lays down on the left side  Relieving factors :pain medications- oxycodone  Weight loss: gained 2 lbs NSAID use: none  PPI use :omeprazole  Gall bladder surgery: still has it  Frequency of bowel movements: once in 2 weeks  Change in bowel movements: long standing , small in size  Relief with bowel movements: yes    Current Outpatient Prescriptions  Medication Sig Dispense Refill  . amoxicillin (AMOXIL) 500 MG capsule Take 2 capsules (1,000 mg total) by mouth 3 (three) times daily. 84 capsule 0  . clarithromycin (BIAXIN) 500 MG tablet Take 1 tablet (500 mg total) by mouth 2 (two) times daily. 28 tablet 0  . omeprazole (PRILOSEC)  20 MG capsule Take 1 capsule (20 mg total) by mouth 2 (two) times daily before a meal. 28 capsule 0  . gabapentin (NEURONTIN) 100 MG capsule Take 1 capsule (100 mg total) by mouth 3 (three) times daily. (Patient not taking: Reported on 06/27/2016) 45 capsule 0  . HYDROcodone-acetaminophen (NORCO/VICODIN) 5-325 MG tablet Take 1 tablet by mouth every 4 (four) hours as needed.  0  . oxyCODONE-acetaminophen (PERCOCET/ROXICET) 5-325 MG tablet Take 1-2 tablets by mouth every 4 (four) hours as needed for moderate pain. (Patient not taking: Reported on 06/27/2016) 40 tablet 0  . ranitidine (ZANTAC) 150 MG tablet Take 150 mg by mouth 2 (two) times daily.  1  . sucralfate (CARAFATE) 1 g tablet Take 1 tablet (1 g total) by mouth 4 (four) times daily. (Patient not taking: Reported on 06/27/2016) 60 tablet 0  . traMADol (ULTRAM) 50 MG tablet Take 2 tablets (100 mg total) by mouth every 6 (six) hours. (Patient not taking: Reported on 06/27/2016) 60 tablet 0   No current facility-administered medications for this visit.     Allergies as of 06/27/2016  . (No Known Allergies)    ROS:  General: Negative for anorexia, weight loss, fever, chills, fatigue, weakness. ENT: Negative for hoarseness, difficulty swallowing , nasal congestion. CV: Negative for chest pain, angina, palpitations, dyspnea on exertion, peripheral edema.  Respiratory: Negative for dyspnea at rest, dyspnea on exertion, cough, sputum, wheezing.  GI: See history of  present illness. GU:  Negative for dysuria, hematuria, urinary incontinence, urinary frequency, nocturnal urination.  Endo: Negative for unusual weight change.    Physical Examination:   BP (!) 148/79   Pulse 78   Temp 98.4 F (36.9 C) (Oral)   Ht '5\' 5"'$  (1.651 m)   Wt 116 lb (52.6 kg)   BMI 19.30 kg/m   General: Thin , well-developed in no acute distress.  Eyes: No icterus. Conjunctivae pink. Mouth: Oropharyngeal mucosa moist and pink , no lesions erythema or  exudate. Lungs: Clear to auscultation bilaterally. Non-labored. Heart: Regular rate and rhythm, no murmurs rubs or gallops.  Abdomen: Bowel sounds are normal, nontender, nondistended, no hepatosplenomegaly or masses, no abdominal bruits or hernia , no rebound or guarding.   Extremities: No lower extremity edema. No clubbing or deformities. Neuro: Alert and oriented x 3.  Grossly intact. Skin: Warm and dry, no jaundice.   Psych: Alert and cooperative, normal mood and affect.   Imaging Studies: Ct Angio Neck W Or Wo Contrast  Result Date: 06/06/2016 CLINICAL DATA:  Initial evaluation for acute onset severe anterior neck pain. EXAM: CT ANGIOGRAPHY NECK TECHNIQUE: Multidetector CT imaging of the neck was performed using the standard protocol during bolus administration of intravenous contrast. Multiplanar CT image reconstructions and MIPs were obtained to evaluate the vascular anatomy. Carotid stenosis measurements (when applicable) are obtained utilizing NASCET criteria, using the distal internal carotid diameter as the denominator. CONTRAST:  100 cc of Isovue 370. COMPARISON:  None available. FINDINGS: Aortic arch: Examination is somewhat technically limited due to timing of the contrast bolus. Aortic arch of normal caliber with normal 3 vessel morphology. Scattered plaque about the origin of the great vessels without high-grade stenosis. Visualized subclavian arteries are widely patent. Right carotid system: Right common carotid artery patent from its origin to the bifurcation without stenosis or acute abnormality. No significant atheromatous disease about the right carotid bifurcation. Right ICA patent from the bifurcation to the skullbase without dissection, stenosis or occlusion. Right external carotid artery and its branches within normal limits. Left carotid system: Left common carotid artery patent from its origin to the bifurcation without stenosis or definite acute abnormality. Evaluation mildly  limited by streak artifact from prominent contrast within the adjacent left internal jugular vein. Mild a centric plaque about the left bifurcation without significant stenosis. Left ICA patent distally to the skullbase without stenosis, dissection, or occlusion. Left external carotid artery and its branches within normal limits. Vertebral arteries: Both of the vertebral arteries arise from the subclavian arteries. Left vertebral artery not well evaluated proximally due to adjacent venous contrast. Proximal pre foraminal right V1 segment within normal limits. Vertebral arteries patent distally without stenosis, dissection, or occlusion. Distal left P2/P3 segments not well evaluated due to adjacent venous contrast as well. Visualized basilar artery widely patent. Skeleton: No acute osseous abnormality. No worrisome lytic or blastic osseous lesions. Other neck: Visualized soft tissues of the neck demonstrate no acute abnormality. Please note evaluation somewhat limited by streak artifact from IV contrast within multiple venous collaterals within the neck. No definite adenopathy. Thyroid grossly unremarkable. Upper chest: Visualized upper chest demonstrates no definite acute abnormality. This is better evaluated on concomitant CT of the chest orbits same time. Emphysema noted. IMPRESSION: 1. Negative CTA of the neck. No acute vascular abnormality identified within the major arterial vasculature of the neck. No high-grade or critical stenosis. 2. Mild atheromatous plaque about the left carotid bifurcation without stenosis. 3. Emphysema. Electronically Signed   By:  Jeannine Boga M.D.   On: 06/06/2016 21:42   Dg Chest Portable 1 View  Result Date: 06/06/2016 CLINICAL DATA:  Upper central chest pain since 1600 hours today. Pain is worse with inspiration. EXAM: PORTABLE CHEST 1 VIEW COMPARISON:  05/26/2016 FINDINGS: Volume loss of the left lung status post reported left lower lobectomy with chain sutures noted  about the left hilum as before. There is hyperinflation of right lung. The cardiac and mediastinal contours are stable in appearance with atherosclerosis of the aortic arch. The lungs are free of pneumonic consolidations. Pulmonary vasculature is unremarkable. No effusion or pneumothorax. No suspicious osseous abnormalities. IMPRESSION: Stable postoperative appearance of the chest with volume loss on the left. Compensatory hyperinflation of the right. Electronically Signed   By: Ashley Royalty M.D.   On: 06/06/2016 20:28   Ct Angio Chest Aorta W And/or Wo Contrast  Result Date: 06/06/2016 CLINICAL DATA:  Neck pain and history of prior left upper lobectomy for carcinoma EXAM: CT ANGIOGRAPHY CHEST WITH CONTRAST TECHNIQUE: Multidetector CT imaging of the chest was performed using the standard protocol during bolus administration of intravenous contrast. Multiplanar CT image reconstructions and MIPs were obtained to evaluate the vascular anatomy. CONTRAST:  100 mL Isovue 370. COMPARISON:  02/07/2016 FINDINGS: Cardiovascular: Thoracic aorta is within normal limits. No evidence of aneurysmal dilatation or dissection is noted. The pulmonary artery shows a normal branching pattern without evidence of filling defect. Mild coronary calcifications are seen. Mediastinum/Nodes: The thoracic inlet is within normal limits. Considerable collateralization of venous flow is noted related to a focal area of narrowing within the left innominate vein best seen on image number 25 of series 6. No significant hilar or mediastinal adenopathy is identified. Lungs/Pleura: Postsurgical changes are noted consistent with the left upper lobectomy. The right lung is well aerated without focal infiltrate or sizable effusion. Very mild emphysematous changes are seen. The residual left lung is within normal limits. No evidence of recurrence is seen. Upper Abdomen: Hypodensity is again noted within the right lobe of the liver stable from prior exam  dated 02/07/2016 consistent with hemangioma. The remainder the upper abdomen is within normal limits. Musculoskeletal: No acute bony abnormality is noted. Review of the MIP images confirms the above findings. IMPRESSION: Status post left upper lobectomy. 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 No evidence of pulmonary emboli.  No aortic dissection is seen. Stable hepatic hemangioma. Electronically Signed   By: Inez Catalina M.D.   On: 06/06/2016 21:27    Assessment and Plan:   Abie Killian is a 60 y.o. y/o female here for follow up after her endoscopy. This is actually her first office visit. Active issues I addressed today.    1. H pylori gastritis- completed treatment - feels better, will check for eradication in 4 weeks 2. 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 Commnence on Linzess, she has been on opiods,tramadol which is worsening her constipation . If does not respond will increase dose. High fiber diet . (her pain resolved on day of colonoscopy after bowel prep)  3. Abdominal pain : likely a combination of constipation and H pylori gastritis. Not yet resolved.   Follow up in 3 months   Dr [name]  MD

## 2016-06-29 ENCOUNTER — Telehealth: Payer: Self-pay

## 2016-06-29 NOTE — Telephone Encounter (Signed)
Patient called and stated that she was not too happy with the side effects from "Linzess". She wanted to know if there was any other medication that could take. Please call and advise.

## 2016-06-29 NOTE — Telephone Encounter (Signed)
Spoke with pt and advised her Linzess is safe to take. Advised her to start taking this daily with plenty of water and to contact me with any issues.

## 2016-07-17 ENCOUNTER — Telehealth: Payer: Self-pay | Admitting: Gastroenterology

## 2016-07-17 NOTE — Telephone Encounter (Signed)
Patient has called and complains of gas and acid that she states that is building up in her stomach. She would like to know if she needs to start a new antibiotic due to history of H pylori. She is not taking anything at this time for reflux. She states that she is only taking Gabapentin PRN. She states that she is afraid to take anything.   Patient is scared to take any of her medication because she states that she has been off it for so long and seems very confused with how to take her medication.

## 2016-07-18 ENCOUNTER — Other Ambulatory Visit: Payer: Self-pay

## 2016-07-18 ENCOUNTER — Telehealth: Payer: Self-pay

## 2016-07-18 DIAGNOSIS — A048 Other specified bacterial intestinal infections: Secondary | ICD-10-CM

## 2016-07-18 MED ORDER — OMEPRAZOLE 20 MG PO CPDR: 20.0000 mg | DELAYED_RELEASE_CAPSULE | Freq: Two times a day (BID) | ORAL | 3 refills | Status: DC

## 2016-07-18 NOTE — Telephone Encounter (Signed)
Ginger, please advise the patient of Dr Georgeann Oppenheim request and place orders if needed. See previous notes.

## 2016-07-18 NOTE — Telephone Encounter (Signed)
Spoke with pt regarding her symptoms. Advised pt the order for the stool test is available for her to go to [facility]. Told her we need to make sure she go rid of the h pylori infection. Sent refill for prilosec to her pharmacy to restart. She stated this was helping her stomach.

## 2016-07-18 NOTE — Telephone Encounter (Signed)
1.Check H pylori stool antigen to check for eradication of H pylori 2. Enquire howoften she has a bowel movement- she was constipated last time 3. She can try OTC prilosec if she feels having a recurrence of acid issues which can happene after eradication of H pylori

## 2016-07-18 NOTE — Telephone Encounter (Signed)
Patient is wanting Dr. Genevive Bi to call her. I told her that I will send a message to his nurse but she said she doesn't want to speak with his nurse. She wants to speak to him. I let her know that I will include that in the message that I will be sending to his nurse. Patient did not disclose any other information with me. Please call the patient and advice.

## 2016-07-18 NOTE — Telephone Encounter (Signed)
Patient called back stating that she is in a lot of pain and needs some medication. She believes she still has bacteria in her stomach. Please call the patient and advice.

## 2016-07-19 ENCOUNTER — Other Ambulatory Visit: Payer: Self-pay | Admitting: Cardiothoracic Surgery

## 2016-07-19 NOTE — Telephone Encounter (Signed)
Called patient and was not able to talk to her. However, I called Dr. Genevive Bi to let him know that Patient would not speak to [place] but would like to speak to him.

## 2016-07-26 ENCOUNTER — Other Ambulatory Visit: Payer: Self-pay

## 2016-07-26 ENCOUNTER — Encounter: Payer: Self-pay | Admitting: Hematology and Oncology

## 2016-07-26 DIAGNOSIS — R0781 Pleurodynia: Secondary | ICD-10-CM | POA: Insufficient documentation

## 2016-07-26 DIAGNOSIS — R0789 Other chest pain: Secondary | ICD-10-CM | POA: Insufficient documentation

## 2016-07-26 MED ORDER — GABAPENTIN 100 MG PO CAPS: 100.0000 mg | ORAL_CAPSULE | Freq: Three times a day (TID) | ORAL | 0 refills | Status: DC

## 2016-08-04 ENCOUNTER — Ambulatory Visit: Payer: Self-pay | Admitting: Cardiothoracic Surgery

## 2016-08-11 ENCOUNTER — Ambulatory Visit (INDEPENDENT_AMBULATORY_CARE_PROVIDER_SITE_OTHER): Payer: BLUE CROSS/BLUE SHIELD | Admitting: Cardiothoracic Surgery

## 2016-08-11 ENCOUNTER — Telehealth: Payer: Self-pay

## 2016-08-11 ENCOUNTER — Ambulatory Visit: Payer: BLUE CROSS/BLUE SHIELD | Admitting: Cardiothoracic Surgery

## 2016-08-11 ENCOUNTER — Encounter: Payer: Self-pay | Admitting: Cardiothoracic Surgery

## 2016-08-11 VITALS — BP 154/100 | HR 96 | Temp 98.0°F | Resp 26 | Ht 65.0 in | Wt 120.2 lb

## 2016-08-11 DIAGNOSIS — R918 Other nonspecific abnormal finding of lung field: Secondary | ICD-10-CM

## 2016-08-11 MED ORDER — OXYCODONE-ACETAMINOPHEN 5-325 MG PO TABS: 1.0000 | ORAL_TABLET | Freq: Four times a day (QID) | ORAL | 0 refills | Status: DC | PRN

## 2016-08-11 NOTE — Progress Notes (Signed)
  Patient ID: Sheila Suarez, female   DOB: 04-03-1956, 61 y.o.   MRN: [id number]  HISTORY: This patient returns today in follow-up. She is status post left thoracotomy and left upper lobectomy. She continues to complain of postoperative discomfort. She states that this was particularly more severe with coughing and sneezing. She was unable to get into the pain management service in today requesting a refill on her Percocet. She also has had complaints with postoperative shortness of breath. This is been a problem with her for several months. In fact she quit her job earlier this year before her surgery because she was short of breath.   Vitals:   08/11/16 1313  BP: (!) 154/100  Pulse: 96  Resp: (!) 26  Temp: 98 F (36.7 C)     EXAM:    Resp: Lungs are clear bilaterally.  No respiratory distress, normal effort. Heart:  Regular without murmurs Abd:  Abdomen is soft, non distended and non tender. No masses are palpable.  There is no rebound and no guarding.  Neurological: Alert and oriented to person, place, and time. Coordination normal.  Skin: Skin is warm and dry. No rash noted. No diaphoretic. No erythema. No pallor. Her thoracotomy wound is well healed. There is no hernia. There is no erythema.  Psychiatric: Normal mood and affect. Normal behavior. Judgment and thought content normal.    ASSESSMENT: Postoperative pain following left thoracotomy.   PLAN:   We will go ahead and set her up to see our pain management specialist. I did refill her prescription for Percocet. We'll also set her up to see Dr. Yolanda Bonine after pulmonary function studies. I did not make a return visit for but would be happy to see her should the need arise.    [name], MD

## 2016-08-11 NOTE — Telephone Encounter (Signed)
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

## 2016-08-11 NOTE — Patient Instructions (Signed)
We will speak with Dr. Georgeann Oppenheim Nurse and have her call you in regards to what to do with your Linzess. We are also contacting the pain clinic and the Pulmonologist (the lung specialist) and we will contact you with these appointment details as soon as we have them available.  We will set you up to have another Pulmonary Function Test. We will call you with this testing date.  We have given you a prescription for pain medication today. This will allow you to get through until you see the pain doctor. Please take this to your pharmacy to have this filled.  Call with any questions or concerns that you may have.

## 2016-08-11 NOTE — Telephone Encounter (Signed)
Patient was seen in office today by Dr. Genevive Bi and explained to him that the '145mg'$  Linzess that she was given by Dr. Vicente Males is causing explosive diarrhea. She denies taking this within 30 minutes of eating. Please call with advice or medication changes.

## 2016-08-14 ENCOUNTER — Other Ambulatory Visit: Payer: Self-pay

## 2016-08-14 MED ORDER — LINACLOTIDE 72 MCG PO CAPS: 72.0000 ug | ORAL_CAPSULE | Freq: Every day | ORAL | 0 refills | Status: DC

## 2016-08-14 NOTE — Telephone Encounter (Signed)
Yes lets try 72 mcg

## 2016-08-14 NOTE — Telephone Encounter (Signed)
Please advise. Would you like to try her on the lower dose?

## 2016-08-14 NOTE — Telephone Encounter (Signed)
Pt notified we will try a lower dose of linzess. Pt still having GI issues so I have scheduled a follow up appt to discuss.

## 2016-08-14 NOTE — Telephone Encounter (Signed)
A referral has been sent to [facility] through [facility]. Patient's chart will be reviewed by a physician and then they will call patient to make an appointment.

## 2016-08-17 ENCOUNTER — Telehealth: Payer: Self-pay

## 2016-08-17 DIAGNOSIS — R0602 Shortness of breath: Secondary | ICD-10-CM

## 2016-08-17 NOTE — Telephone Encounter (Signed)
Order placed for PFT's. Will call specialty scheduling tomorrow to schedule for patient.  Call made to [facility] at this time. Their office is closed due to weather. Will call once again tomorrow to set-up Follow-up appointment with Dr. Mortimer Fries.

## 2016-08-21 NOTE — Telephone Encounter (Signed)
Call made to speciality scheduling at this time. No answer. Left voicemail for return phone call in regards to PFT's.

## 2016-08-22 NOTE — Telephone Encounter (Signed)
PFT's scheduled for 08/29/16 at 1030am- arrive 1015am.  Appointment with Dr. Mortimer Fries is on 09/29/16 but patient has been placed on a waiting list to see her sooner when a cancellation is available.

## 2016-08-24 NOTE — Telephone Encounter (Signed)
Call made to patient at this time. All appointment information given.   Patient encouraged to call Pulmonary office if breathing becomes worse prior to scheduled appointment to see if she can be worked in sooner.

## 2016-08-28 ENCOUNTER — Ambulatory Visit (INDEPENDENT_AMBULATORY_CARE_PROVIDER_SITE_OTHER): Payer: BLUE CROSS/BLUE SHIELD | Admitting: Gastroenterology

## 2016-08-28 ENCOUNTER — Encounter: Payer: Self-pay | Admitting: Gastroenterology

## 2016-08-28 VITALS — BP 126/78 | HR 86 | Wt 121.0 lb

## 2016-08-28 DIAGNOSIS — A048 Other specified bacterial intestinal infections: Secondary | ICD-10-CM

## 2016-08-28 DIAGNOSIS — K581 Irritable bowel syndrome with constipation: Secondary | ICD-10-CM

## 2016-08-28 DIAGNOSIS — M2550 Pain in unspecified joint: Secondary | ICD-10-CM | POA: Insufficient documentation

## 2016-08-28 MED ORDER — OMEPRAZOLE 40 MG PO CPDR: 40.0000 mg | DELAYED_RELEASE_CAPSULE | Freq: Every day | ORAL | 3 refills | Status: DC

## 2016-08-28 NOTE — Patient Instructions (Addendum)
Irritable Bowel Syndrome, Adult Irritable bowel syndrome (IBS) is not one specific disease. It is a group of symptoms that affects the organs responsible for digestion (gastrointestinal or GI tract). To regulate how your GI tract works, your body sends signals back and forth between your intestines and your brain. If you have IBS, there may be a problem with these signals. As a result, your GI tract does not function normally. Your intestines may become more sensitive and overreact to certain things. This is especially true when you eat certain foods or when you are under stress. There are four types of IBS. These may be determined based on the consistency of your stool:  IBS with diarrhea.  IBS with constipation.  Mixed IBS.  Unsubtyped IBS. It is important to know which type of IBS you have. Some treatments are more likely to be helpful for certain types of IBS. What are the causes? The exact cause of IBS is not known. What increases the risk? You may have a higher risk of IBS if:  You are a woman.  You are younger than 60 years old.  You have a family history of IBS.  You have mental health problems.  You have had bacterial infection of your GI tract. What are the signs or symptoms? Symptoms of IBS vary from person to person. The main symptom is abdominal pain or discomfort. Additional symptoms usually include one or more of the following:  Diarrhea, constipation, or both.  Abdominal swelling or bloating.  Feeling full or sick after eating a small or regular-size meal.  Frequent gas.  Mucus in the stool.  A feeling of having more stool left after a bowel movement. Symptoms tend to come and go. They may be associated with stress, psychiatric conditions, or nothing at all. How is this diagnosed? There is no specific test to diagnose IBS. Your health care provider will make a diagnosis based on a physical exam, medical history, and your symptoms. You may have other tests to  rule out other conditions that may be causing your symptoms. These may include:  Blood tests.  X-rays.  CT scan.  Endoscopy and colonoscopy. This is a test in which your GI tract is viewed with a long, thin, flexible tube. How is this treated? There is no cure for IBS, but treatment can help relieve symptoms. IBS treatment often includes:  Changes to your diet, such as:  Eating more fiber.  Avoiding foods that cause symptoms.  Drinking more water.  Eating regular, medium-sized portioned meals.  Medicines. These may include:  Fiber supplements if you have constipation.  Medicine to control diarrhea (antidiarrheal medicines).  Medicine to help control muscle spasms in your GI tract (antispasmodic medicines).  Medicines to help with any mental health issues, such as antidepressants or tranquilizers.  Therapy.  Talk therapy may help with anxiety, depression, or other mental health issues that can make IBS symptoms worse.  Stress reduction.  Managing your stress can help keep symptoms under control. Follow these instructions at home:  Take medicines only as directed by your health care provider.  Eat a healthy diet.  Avoid foods and drinks with added sugar.  Include more whole grains, fruits, and vegetables gradually into your diet. This may be especially helpful if you have IBS with constipation.  Avoid any foods and drinks that make your symptoms worse. These may include dairy products and caffeinated or carbonated drinks.  Do not eat large meals.  Drink enough fluid to keep your urine   clear or pale yellow.  Exercise regularly. Ask your health care provider for recommendations of good activities for you.  Keep all follow-up visits as directed by your health care provider. This is important. Contact a health care provider if:  You have constant pain.  You have trouble or pain with swallowing.  You have worsening diarrhea. Get help right away if:  You  have severe and worsening abdominal pain.  You have diarrhea and:  You have a rash, stiff neck, or severe headache.  You are irritable, sleepy, or difficult to awaken.  You are weak, dizzy, or extremely thirsty.  You have bright red blood in your stool or you have black tarry stools.  You have unusual abdominal swelling that is painful.  You vomit continuously.  You vomit blood (hematemesis).  You have both abdominal pain and a fever. This information is not intended to replace advice given to you by your health care provider. Make sure you discuss any questions you have with your health care provider. Document Released: 07/17/2005 Document Revised: 12/17/2015 Document Reviewed: 04/03/2014 Elsevier Interactive Patient Education  2017 [place].   Follow up Appointment October 09, 2016 @ 1:00

## 2016-08-28 NOTE — Progress Notes (Signed)
Primary Care Physician: [place]  Primary Gastroenterologist:  Dr. [name]   Chief Complaint  Patient presents with  . Follow-up    h plyori/ still having issues wiith bloating     HPI: Laquandra Carrillo is a 61 y.o. She is here for a follow up visit for abdominal pain. She was last seen on 06/27/16 .   Summary of history : Abdominal pain ongoing for many years, upper abdomen, severe constipation and relief after a bowel movement . She has been on narcotics.   She had a normal colonoscopy on 06/02/2016 and an EGD which appeared normal but biopsies taken showed active chronic gastritis with H pylori .triple therapy prescribed on 06/16/16 .completed treatment for  H pylori. She is a S/p left thoracotomy and LUL lobectomy  LFT's normal in 05/2016 . PET scan in 01/2016 showed only some mild hypermetabolism in the stomach and rest of GI tract was negative.  Interval history 05/2016-07/2016 Commenced on her on linzess after her last visit.  We ordered a repeat stool H pylori antigen which she has not obtained so far.   Gained 2 lbs since last visit.  She has been referred for pain clinic.   Takes linzess as needed and when she does have a good bowel movement with pain relief. She is not on omeprazole , she has run out of it, when she is on it she has minimal abdominal pain. Her last bowel movement was 2 days back and was very hard.   Current Outpatient Prescriptions  Medication Sig Dispense Refill  . clarithromycin (BIAXIN) 500 MG tablet TAKE 1 TABLET (500 MG TOTAL) BY MOUTH 2 (TWO) TIMES DAILY.    Marland Kitchen gabapentin (NEURONTIN) 100 MG capsule Take 1 capsule (100 mg total) by mouth 3 (three) times daily. 90 capsule 0  . linaclotide (LINZESS) 145 MCG CAPS capsule TAKE 1 CAPSULE (145 MCG TOTAL) BY MOUTH DAILY BEFORE BREAKFAST.    Marland Kitchen linaclotide (LINZESS) 72 MCG capsule Take 1 capsule (72 mcg total) by mouth daily before breakfast. 30 capsule 0  . linaclotide (LINZESS) 72 MCG  capsule Take by mouth.    . meloxicam (MOBIC) 7.5 MG tablet Take by mouth.    Marland Kitchen omeprazole (PRILOSEC) 20 MG capsule TAKE 1 CAPSULE (20 MG TOTAL) BY MOUTH 2 (TWO) TIMES DAILY BEFORE A MEAL.    Marland Kitchen ondansetron (ZOFRAN) 4 MG tablet Take by mouth.    . oxyCODONE-acetaminophen (PERCOCET/ROXICET) 5-325 MG tablet Take by mouth.    . oxyCODONE-acetaminophen (ROXICET) 5-325 MG tablet Take 1 tablet by mouth every 6 (six) hours as needed for severe pain. 20 tablet 0  . ranitidine (ZANTAC) 150 MG tablet Take by mouth.    . sucralfate (CARAFATE) 1 g tablet Take by mouth.     No current facility-administered medications for this visit.     Allergies as of 08/28/2016  . (No Known Allergies)  BP 126/78   Pulse 86   Wt 121 lb (54.9 kg)   BMI 20.14 kg/m    ROS:  General: Negative for anorexia, weight loss, fever, chills, fatigue, weakness. ENT: Negative for hoarseness, difficulty swallowing , nasal congestion. CV: Negative for chest pain, angina, palpitations, dyspnea on exertion, peripheral edema.  Respiratory: Negative for dyspnea at rest, dyspnea on exertion, cough, sputum, wheezing.  GI: See history of present illness. GU:  Negative for dysuria, hematuria, urinary incontinence, urinary frequency, nocturnal urination.  Endo: Negative for unusual weight change.    Physical Examination:   There were  no vitals taken for this visit.  General: Well-nourished, well-developed in no acute distress.  Eyes: No icterus. Conjunctivae pink. Mouth: Oropharyngeal mucosa moist and pink , no lesions erythema or exudate. Lungs: Clear to auscultation bilaterally. Non-labored. Heart: Regular rate and rhythm, no murmurs rubs or gallops.  Abdomen: Bowel sounds are normal, nontender, nondistended, no hepatosplenomegaly or masses, no abdominal bruits or hernia , no rebound or guarding.   Extremities: No lower extremity edema. No clubbing or deformities. Neuro: Alert and oriented x 3.  Grossly intact. Skin: Warm and  dry, no jaundice.   Psych: Alert and cooperative, normal mood and affect.  Imaging Studies: No results found.  Assessment and Plan:   Chaka Boyson is a 61 y.o. y/o female  here for follow up. Active issues    1. H pylori gastritis- completed treatment -  will recheck for eradication , still has some abdominal discomfort unsure if from gastritis or from IBS. No NSAIDS.  2. Irritable bowel syndrome with constipation - Commnenced on Linzess last visit , she has been on opiods,tramadol which is worsening her constipation .she has been taking the 72 mcg linzess as needed rather than as scheduled daily. She is having very hard stools at this point but admits that the linzess does work well when taken.Suggest to take daily . Suggest high fiber diet . Presently her diet is poor in fiber.  3. Abdominal pain : likely a combination of constipation and H pylori gastritis. Not yet resolved.  F/u in 2 months   Dr [name]  MD

## 2016-08-29 ENCOUNTER — Other Ambulatory Visit: Payer: Self-pay | Admitting: Cardiothoracic Surgery

## 2016-08-29 ENCOUNTER — Ambulatory Visit: Payer: BLUE CROSS/BLUE SHIELD | Attending: Cardiothoracic Surgery

## 2016-08-29 DIAGNOSIS — J984 Other disorders of lung: Secondary | ICD-10-CM | POA: Insufficient documentation

## 2016-08-29 DIAGNOSIS — R0602 Shortness of breath: Secondary | ICD-10-CM | POA: Insufficient documentation

## 2016-08-29 DIAGNOSIS — C3492 Malignant neoplasm of unspecified part of left bronchus or lung: Secondary | ICD-10-CM

## 2016-08-29 LAB — BLOOD GAS, ARTERIAL
ACID-BASE DEFICIT: 1.6 mmol/L (ref 0.0–2.0)
Bicarbonate: 22.9 mmol/L (ref 20.0–28.0)
FIO2: 21
O2 SAT: 97 %
Patient temperature: 37
pCO2 arterial: 37 mmHg (ref 32.0–48.0)
pH, Arterial: 7.4 (ref 7.350–7.450)
pO2, Arterial: 91 mmHg (ref 83.0–108.0)

## 2016-08-29 MED ORDER — OXYCODONE-ACETAMINOPHEN 5-325 MG PO TABS: 1.0000 | ORAL_TABLET | Freq: Four times a day (QID) | ORAL | 0 refills | Status: DC | PRN

## 2016-08-29 MED ORDER — ALBUTEROL SULFATE (2.5 MG/3ML) 0.083% IN NEBU
2.5000 mg | INHALATION_SOLUTION | Freq: Once | RESPIRATORY_TRACT | Status: AC
  Administered 2016-08-29: 2.5 mg via RESPIRATORY_TRACT
  Filled 2016-08-29: qty 3

## 2016-09-07 IMAGING — CR DG CHEST 2V
1 series · 2 of 2 positions shown · non-contrast
Comparison: Chest x-ray of 02/07/2016 and PET-CT of 02/15/2016

CLINICAL DATA: Preop for lung lesion

EXAM:
CHEST  2 VIEW

[Series 1: dg chest 2 view · 0.14mm/px · 2 of 2 slices shown]
[im 1/2]
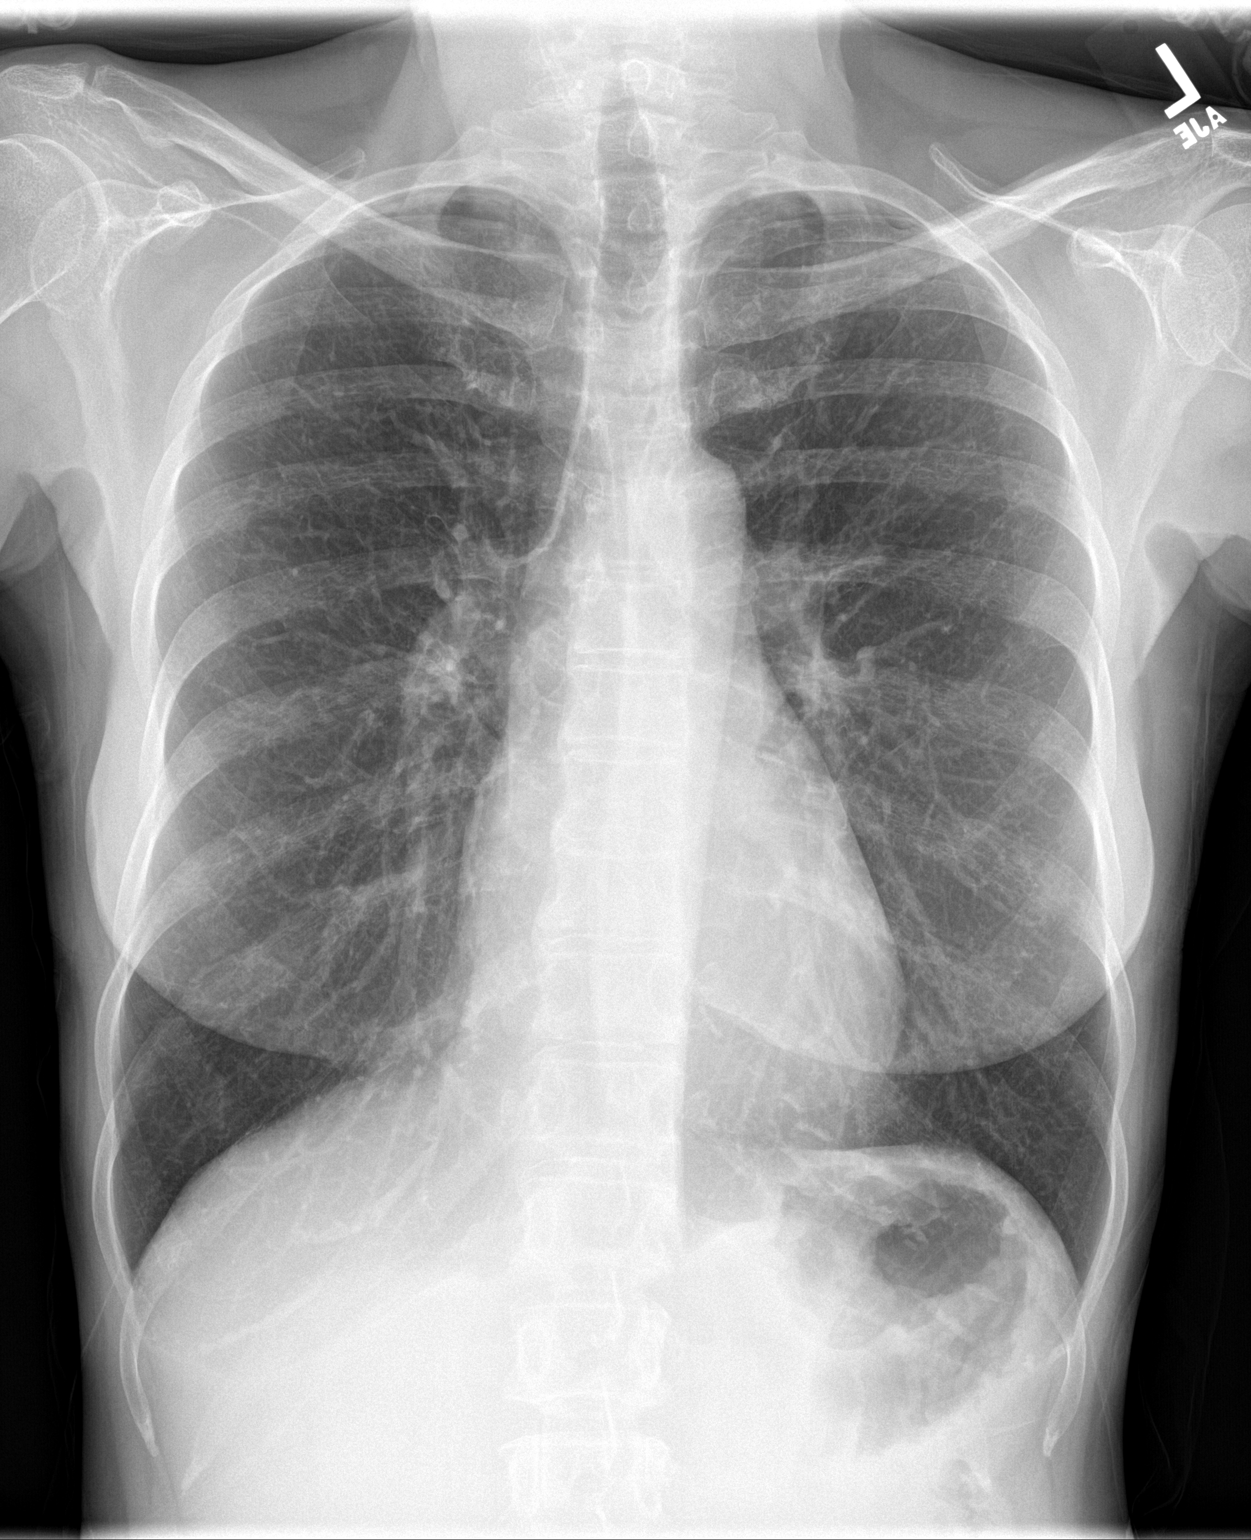
[im 2/2]
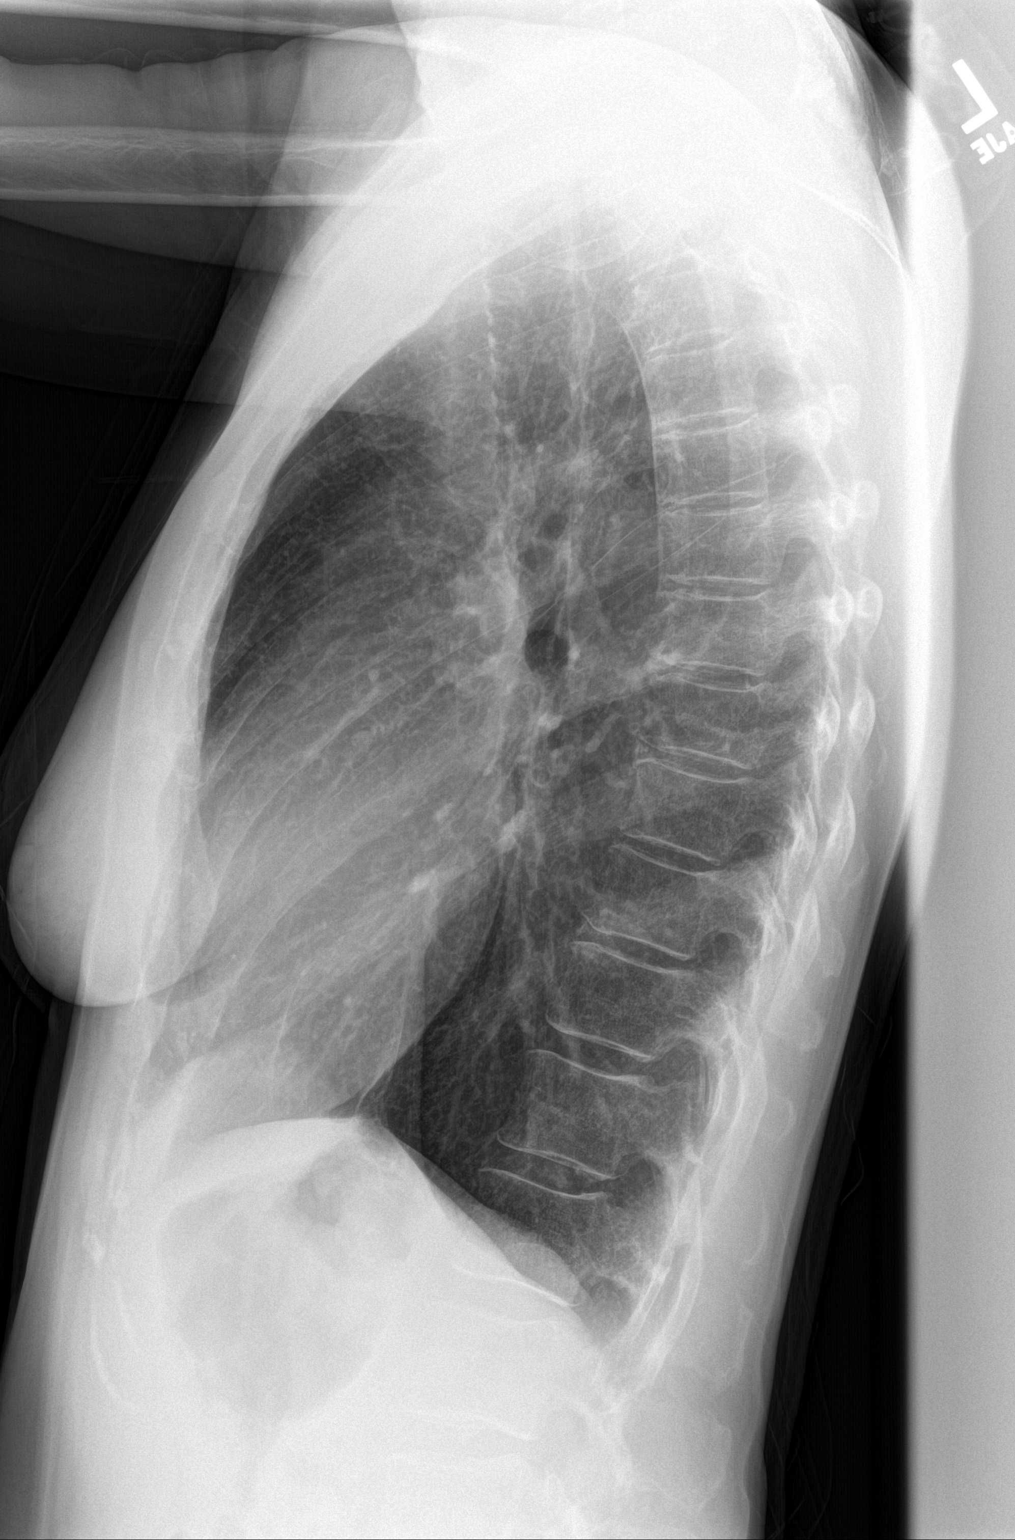

[2 of 2 positions shown; findings below may reference images not displayed]

FINDINGS: The small subtle left upper lobe apical lung lesion is not well seen
by chest x-ray, lying just behind the mid proximal left clavicle.
The lungs are otherwise clear and slightly hyperaerated. Mediastinal
and hilar contours are unremarkable. The heart is within normal
limits in size. No bony abnormality is seen.
IMPRESSION: The subtle left apical lung lesion is poorly seen by chest x-ray. No
active lung disease.

## 2016-09-12 ENCOUNTER — Encounter: Payer: Self-pay | Admitting: Anesthesiology

## 2016-09-12 ENCOUNTER — Ambulatory Visit: Payer: BLUE CROSS/BLUE SHIELD | Attending: Anesthesiology | Admitting: Anesthesiology

## 2016-09-12 VITALS — BP 156/85 | HR 64 | Temp 97.8°F | Resp 16 | Ht 65.0 in | Wt 120.0 lb

## 2016-09-12 DIAGNOSIS — Z800 Family history of malignant neoplasm of digestive organs: Secondary | ICD-10-CM | POA: Diagnosis not present

## 2016-09-12 DIAGNOSIS — I2510 Atherosclerotic heart disease of native coronary artery without angina pectoris: Secondary | ICD-10-CM | POA: Diagnosis not present

## 2016-09-12 DIAGNOSIS — Z808 Family history of malignant neoplasm of other organs or systems: Secondary | ICD-10-CM | POA: Insufficient documentation

## 2016-09-12 DIAGNOSIS — Z79899 Other long term (current) drug therapy: Secondary | ICD-10-CM | POA: Insufficient documentation

## 2016-09-12 DIAGNOSIS — F17210 Nicotine dependence, cigarettes, uncomplicated: Secondary | ICD-10-CM | POA: Insufficient documentation

## 2016-09-12 DIAGNOSIS — Z8249 Family history of ischemic heart disease and other diseases of the circulatory system: Secondary | ICD-10-CM | POA: Diagnosis not present

## 2016-09-12 DIAGNOSIS — G8912 Acute post-thoracotomy pain: Secondary | ICD-10-CM

## 2016-09-12 DIAGNOSIS — Z8042 Family history of malignant neoplasm of prostate: Secondary | ICD-10-CM | POA: Insufficient documentation

## 2016-09-12 DIAGNOSIS — K219 Gastro-esophageal reflux disease without esophagitis: Secondary | ICD-10-CM | POA: Insufficient documentation

## 2016-09-12 DIAGNOSIS — R079 Chest pain, unspecified: Secondary | ICD-10-CM | POA: Diagnosis not present

## 2016-09-12 DIAGNOSIS — M329 Systemic lupus erythematosus, unspecified: Secondary | ICD-10-CM | POA: Insufficient documentation

## 2016-09-12 DIAGNOSIS — Z9889 Other specified postprocedural states: Secondary | ICD-10-CM | POA: Insufficient documentation

## 2016-09-12 DIAGNOSIS — Z902 Acquired absence of lung [part of]: Secondary | ICD-10-CM | POA: Insufficient documentation

## 2016-09-12 DIAGNOSIS — M546 Pain in thoracic spine: Secondary | ICD-10-CM | POA: Diagnosis not present

## 2016-09-12 DIAGNOSIS — Z9851 Tubal ligation status: Secondary | ICD-10-CM | POA: Diagnosis not present

## 2016-09-12 DIAGNOSIS — D1803 Hemangioma of intra-abdominal structures: Secondary | ICD-10-CM | POA: Diagnosis not present

## 2016-09-12 DIAGNOSIS — Z801 Family history of malignant neoplasm of trachea, bronchus and lung: Secondary | ICD-10-CM | POA: Diagnosis not present

## 2016-09-12 DIAGNOSIS — G8929 Other chronic pain: Secondary | ICD-10-CM | POA: Diagnosis not present

## 2016-09-12 DIAGNOSIS — J439 Emphysema, unspecified: Secondary | ICD-10-CM | POA: Insufficient documentation

## 2016-09-12 NOTE — Progress Notes (Signed)
Safety precautions to be maintained throughout the outpatient stay will include: orient to surroundings, keep bed in low position, maintain call bell within reach at all times, provide assistance with transfer out of bed and ambulation.  

## 2016-09-14 NOTE — Progress Notes (Signed)
Subjective:  Patient ID: Sheila Suarez, female    DOB: Nov 08, 1955  Age: 61 y.o. MRN: [id number]  CC: Chest Pain (mid) and Pain (rib cage on the left)    PROCEDURE:None  HPI Sheila Suarez presents for a new patient evaluation. She is a pleasant 61 year old female that has had a recent thoracotomy for a lobe resection with Dr. Faith Rogue. She is reporting some persistent pain in a dermatomal distribution left side in the mid axillary line and below the posterior left scapula. The pain quality is cramping with associated itching and nagging continuous symptoms. Occasionally the pain as stabbing but is generally uncomfortable with a pain score of 5 out of 10. It does not appear to be influenced by time of day and has persisted since her surgery in September 2017. No associated shortness of breath or chest pain or difficulty with inspiration or expiration are noted. She has some cramping pain at night and despite medication management the pain persists. She reports having had a thorough postoperative workup with Dr. Faith Rogue which was otherwise unremarkable. Postoperative associated problems.  History Sheila Suarez has a past medical history of Cancer ([place]); Coronary artery disease; Emphysema of lung ([place]); GERD (gastroesophageal reflux disease); Lupus; and Shortness of breath dyspnea.   She has a past surgical history that includes Tubal ligation (Bilateral); Cyst excision (Left); Flexible bronchoscopy (N/A, 04/17/2016); Thoracotomy (Left, 04/17/2016); Colonoscopy with propofol (N/A, 06/02/2016); Esophagogastroduodenoscopy (egd) with propofol (N/A, 06/02/2016); Lung removal, partial (Left); left upper lobectomy (Left, 03/2016); and Hand surgery (Left).   Her family history includes Cancer in her maternal aunt, maternal aunt, and maternal uncle; Cancer (age of onset: 34) in her daughter; Heart disease in her father; Lung cancer in her brother and sister; Pancreatic cancer in her sister; Stomach cancer in her  paternal grandmother.She reports that she quit smoking about 5 months ago. Her smoking use included Cigarettes. She has a 8.75 pack-year smoking history. She quit smokeless tobacco use about 5 months ago. She reports that she does not drink alcohol or use drugs.  No results found for this or any previous visit.  No results found for: TOXASSSELUR  Outpatient Medications Prior to Visit  Medication Sig Dispense Refill  . gabapentin (NEURONTIN) 100 MG capsule Take 1 capsule (100 mg total) by mouth 3 (three) times daily. 90 capsule 0  . linaclotide (LINZESS) 145 MCG CAPS capsule TAKE 1 CAPSULE (145 MCG TOTAL) BY MOUTH DAILY BEFORE BREAKFAST.    Marland Kitchen omeprazole (PRILOSEC) 40 MG capsule Take 1 capsule (40 mg total) by mouth daily. 30 capsule 3  . ondansetron (ZOFRAN) 4 MG tablet Take 4 mg by mouth as needed.     . ranitidine (ZANTAC) 150 MG tablet Take 150 mg by mouth daily.     . sucralfate (CARAFATE) 1 g tablet Take 1 g by mouth 4 (four) times daily -  with meals and at bedtime.     . clarithromycin (BIAXIN) 500 MG tablet TAKE 1 TABLET (500 MG TOTAL) BY MOUTH 2 (TWO) TIMES DAILY.    Marland Kitchen linaclotide (LINZESS) 72 MCG capsule Take 1 capsule (72 mcg total) by mouth daily before breakfast. (Patient not taking: Reported on 09/12/2016) 30 capsule 0  . linaclotide (LINZESS) 72 MCG capsule Take by mouth.    . meloxicam (MOBIC) 7.5 MG tablet Take by mouth.    . oxyCODONE-acetaminophen (PERCOCET/ROXICET) 5-325 MG tablet Take by mouth.    . oxyCODONE-acetaminophen (ROXICET) 5-325 MG tablet Take 1 tablet by mouth every 6 (six) hours as needed for  severe pain. (Patient not taking: Reported on 09/12/2016) 20 tablet 0   No facility-administered medications prior to visit.    Lab Results  Component Value Date   WBC 7.3 06/19/2016   HGB 11.5 (L) 06/19/2016   HCT 35.3 06/19/2016   PLT 428 06/19/2016   GLUCOSE 83 06/19/2016   ALT 9 (L) 06/19/2016   AST 11 (L) 06/19/2016   NA 142 06/19/2016   K 4.0 06/19/2016   CL  110 06/19/2016   CREATININE 0.83 06/19/2016   BUN 9 06/19/2016   CO2 26 06/19/2016   TSH 0.635 02/09/2016   INR 0.91 03/28/2016    --------------------------------------------------------------------------------------------------------------------- Ct Angio Neck W Or Wo Contrast  Result Date: 06/06/2016 CLINICAL DATA:  Initial evaluation for acute onset severe anterior neck pain. EXAM: CT ANGIOGRAPHY NECK TECHNIQUE: Multidetector CT imaging of the neck was performed using the standard protocol during bolus administration of intravenous contrast. Multiplanar CT image reconstructions and MIPs were obtained to evaluate the vascular anatomy. Carotid stenosis measurements (when applicable) are obtained utilizing NASCET criteria, using the distal internal carotid diameter as the denominator. CONTRAST:  100 cc of Isovue 370. COMPARISON:  None available. FINDINGS: Aortic arch: Examination is somewhat technically limited due to timing of the contrast bolus. Aortic arch of normal caliber with normal 3 vessel morphology. Scattered plaque about the origin of the great vessels without high-grade stenosis. Visualized subclavian arteries are widely patent. Right carotid system: Right common carotid artery patent from its origin to the bifurcation without stenosis or acute abnormality. No significant atheromatous disease about the right carotid bifurcation. Right ICA patent from the bifurcation to the skullbase without dissection, stenosis or occlusion. Right external carotid artery and its branches within normal limits. Left carotid system: Left common carotid artery patent from its origin to the bifurcation without stenosis or definite acute abnormality. Evaluation mildly limited by streak artifact from prominent contrast within the adjacent left internal jugular vein. Mild a centric plaque about the left bifurcation without significant stenosis. Left ICA patent distally to the skullbase without stenosis, dissection,  or occlusion. Left external carotid artery and its branches within normal limits. Vertebral arteries: Both of the vertebral arteries arise from the subclavian arteries. Left vertebral artery not well evaluated proximally due to adjacent venous contrast. Proximal pre foraminal right V1 segment within normal limits. Vertebral arteries patent distally without stenosis, dissection, or occlusion. Distal left P2/P3 segments not well evaluated due to adjacent venous contrast as well. Visualized basilar artery widely patent. Skeleton: No acute osseous abnormality. No worrisome lytic or blastic osseous lesions. Other neck: Visualized soft tissues of the neck demonstrate no acute abnormality. Please note evaluation somewhat limited by streak artifact from IV contrast within multiple venous collaterals within the neck. No definite adenopathy. Thyroid grossly unremarkable. Upper chest: Visualized upper chest demonstrates no definite acute abnormality. This is better evaluated on concomitant CT of the chest orbits same time. Emphysema noted. IMPRESSION: 1. Negative CTA of the neck. No acute vascular abnormality identified within the major arterial vasculature of the neck. No high-grade or critical stenosis. 2. Mild atheromatous plaque about the left carotid bifurcation without stenosis. 3. Emphysema. Electronically Signed   By: Jeannine Boga M.D.   On: 06/06/2016 21:42   Dg Chest Portable 1 View  Result Date: 06/06/2016 CLINICAL DATA:  Upper central chest pain since 1600 hours today. Pain is worse with inspiration. EXAM: PORTABLE CHEST 1 VIEW COMPARISON:  05/26/2016 FINDINGS: Volume loss of the left lung status post reported left lower lobectomy with  chain sutures noted about the left hilum as before. There is hyperinflation of right lung. The cardiac and mediastinal contours are stable in appearance with atherosclerosis of the aortic arch. The lungs are free of pneumonic consolidations. Pulmonary vasculature is  unremarkable. No effusion or pneumothorax. No suspicious osseous abnormalities. IMPRESSION: Stable postoperative appearance of the chest with volume loss on the left. Compensatory hyperinflation of the right. Electronically Signed   By: Ashley Royalty M.D.   On: 06/06/2016 20:28   Ct Angio Chest Aorta W And/or Wo Contrast  Result Date: 06/06/2016 CLINICAL DATA:  Neck pain and history of prior left upper lobectomy for carcinoma EXAM: CT ANGIOGRAPHY CHEST WITH CONTRAST TECHNIQUE: Multidetector CT imaging of the chest was performed using the standard protocol during bolus administration of intravenous contrast. Multiplanar CT image reconstructions and MIPs were obtained to evaluate the vascular anatomy. CONTRAST:  100 mL Isovue 370. COMPARISON:  02/07/2016 FINDINGS: Cardiovascular: Thoracic aorta is within normal limits. No evidence of aneurysmal dilatation or dissection is noted. The pulmonary artery shows a normal branching pattern without evidence of filling defect. Mild coronary calcifications are seen. Mediastinum/Nodes: The thoracic inlet is within normal limits. Considerable collateralization of venous flow is noted related to a focal area of narrowing within the left innominate vein best seen on image number 25 of series 6. No significant hilar or mediastinal adenopathy is identified. Lungs/Pleura: Postsurgical changes are noted consistent with the left upper lobectomy. The right lung is well aerated without focal infiltrate or sizable effusion. Very mild emphysematous changes are seen. The residual left lung is within normal limits. No evidence of recurrence is seen. Upper Abdomen: Hypodensity is again noted within the right lobe of the liver stable from prior exam dated 02/07/2016 consistent with hemangioma. The remainder the upper abdomen is within normal limits. Musculoskeletal: No acute bony abnormality is noted. Review of the MIP images confirms the above findings. IMPRESSION: Status post left upper  lobectomy. 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 No evidence of pulmonary emboli.  No aortic dissection is seen. Stable hepatic hemangioma. Electronically Signed   By: Inez Catalina M.D.   On: 06/06/2016 21:27       ---------------------------------------------------------------------------------------------------------------------- Past Medical History:  Diagnosis Date  . Cancer ([place])   . Coronary artery disease   . Emphysema of lung ([place])   . GERD (gastroesophageal reflux disease)   . Lupus   . Shortness of breath dyspnea     Past Surgical History:  Procedure Laterality Date  . COLONOSCOPY WITH PROPOFOL N/A 06/02/2016   Procedure: COLONOSCOPY WITH PROPOFOL;  Surgeon: [name], MD;  Location: [facility] ENDOSCOPY;  Service: Gastroenterology;  Laterality: N/A;  . CYST EXCISION Left    hand  . ESOPHAGOGASTRODUODENOSCOPY (EGD) WITH PROPOFOL N/A 06/02/2016   Procedure: ESOPHAGOGASTRODUODENOSCOPY (EGD) WITH PROPOFOL;  Surgeon: [name], MD;  Location: [facility] ENDOSCOPY;  Service: Gastroenterology;  Laterality: N/A;  . FLEXIBLE BRONCHOSCOPY N/A 04/17/2016   Procedure: FLEXIBLE BRONCHOSCOPY;  Surgeon: [name], MD;  Location: [facility] ORS;  Service: Thoracic;  Laterality: N/A;  . HAND SURGERY Left   . left upper lobectomy Left 03/2016  . LUNG REMOVAL, PARTIAL Left    Left lower lobe  . THORACOTOMY Left 04/17/2016   Procedure: THORACOTOMY LEFT UPPER LOBE LUNG RESECTION;  Surgeon: [name], MD;  Location: [facility] ORS;  Service: Thoracic;  Laterality: Left;  . TUBAL LIGATION Bilateral    37 years ago    Family  History  Problem Relation Age of Onset  . Pancreatic cancer Sister   . Lung cancer Sister   . Lung cancer Brother   . Stomach cancer Paternal Grandmother   . Heart disease Father   . Cancer Daughter 6    cervical  . Cancer Maternal Aunt     brain  . Cancer Maternal Uncle     prostate  . Cancer  Maternal Aunt     bone    Social History  Substance Use Topics  . Smoking status: Former Smoker    Packs/day: 0.25    Years: 35.00    Types: Cigarettes    Quit date: 03/19/2016  . Smokeless tobacco: Former Systems developer    Quit date: 03/16/2016  . Alcohol use No     Comment: Rarely    ---------------------------------------------------------------------------------------------------------------------  Scheduled Meds: Continuous Infusions: PRN Meds:.   BP (!) 156/85 (BP Location: Left Arm, Patient Position: Sitting, Cuff Size: Normal)   Pulse 64   Temp 97.8 F (36.6 C) (Oral)   Resp 16   Ht '5\' 5"'$  (1.651 m)   Wt 120 lb (54.4 kg)   SpO2 100%   BMI 19.97 kg/m    BP Readings from Last 3 Encounters:  09/12/16 (!) 156/85  08/28/16 126/78  08/11/16 (!) 154/100     Wt Readings from Last 3 Encounters:  09/12/16 120 lb (54.4 kg)  08/28/16 121 lb (54.9 kg)  08/11/16 120 lb 3.2 oz (54.5 kg)     ----------------------------------------------------------------------------------------------------------------------  ROS Review of Systems  Cardiac: No chest pain Pulmonary: No shortness of breath or dyspnea Neurologic: Negative Psychologic: No history of drug abuse or suicidal ideation or anxiety GI: Ulcers and reflux no hematemesis or hemoptysis Rheumatologic: History of lupus  Objective:  BP (!) 156/85 (BP Location: Left Arm, Patient Position: Sitting, Cuff Size: Normal)   Pulse 64   Temp 97.8 F (36.6 C) (Oral)   Resp 16   Ht '5\' 5"'$  (1.651 m)   Wt 120 lb (54.4 kg)   SpO2 100%   BMI 19.97 kg/m   Physical Exam Patient is alert oriented cooperative compliant Pupils are equally round reactive to light with extraocular muscles intact Lungs are clear to auscultation with distant breath sounds Heart is regular rate and rhythm Inspection of the left mid axillary line thoracic region reveals a well-healed scar approximately T8 thoracic level with some mild tenderness to light  touch consistent with hypoalgesia and dysesthesia. No other cutaneous findings are noted. The incisions from the previous chest tubes appear to be well healed     Assessment & Plan:   Sheila Suarez was seen today for chest pain and pain.  Diagnoses and all orders for this visit:  Acute postthoracotomy pain  Chronic left-sided thoracic back pain     ----------------------------------------------------------------------------------------------------------------------  Problem List Items Addressed This Visit    None    Visit Diagnoses    Acute postthoracotomy pain    -  Primary   Chronic left-sided thoracic back pain          ----------------------------------------------------------------------------------------------------------------------  1. Acute postthoracotomy pain I've spoken with the patient regarding possible intercostal nerve blocks to see if this could help alleviate some of her pain and she is unwilling to undergo any type of injection therapy at all. I talked to her about the risks and potential benefits of such injections and despite our extensive conversation she is unwilling to consider injection therapy.  2. Chronic left-sided thoracic back pain Continue with physical therapy. We  have also talked about medication management in addition to serotonin uptake inhibitors, tricyclic antidepressants, Lidoderm patch but she is interested in none of these modalities. At this point she desires to follow-up with [place] on a when necessary basis should she consent to some form of medication management or injection therapy.    ----------------------------------------------------------------------------------------------------------------------  I am having Ms. Muro maintain her gabapentin, linaclotide, clarithromycin, linaclotide, linaclotide, meloxicam, ondansetron, oxyCODONE-acetaminophen, ranitidine, sucralfate, omeprazole, and oxyCODONE-acetaminophen.   No orders of the  defined types were placed in this encounter.      Follow-up: Return if symptoms worsen or fail to improve.    Molli Barrows, MD 5:03 PM  The [place] practitioner database for opioid medications on this patient has been reviewed by me and my staff   Greater than 50% of the total encounter time was spent in counseling and / or coordination of care.     This dictation was performed utilizing Systems analyst.  Please excuse any unintentional or mistaken typographical errors as a result.

## 2016-09-17 NOTE — Progress Notes (Signed)
[facility] day:  09/18/16  Chief Complaint: Sheila Suarez is a 61 y.o. female with lupus s/p left upper lobe lobectomy who is seen for 3 month assessment.  HPI: The patient was last seen in the medical oncology clinic on 06/19/2016.  At that time, she had ongoing pain where her chest tube was pulled.  Weight was up 2 pounds.  She was seen by Dr. Vashti Hey on 09/12/2016 to assist with management of her chest wall pain s/p thoracotomy.  She declined intercostal nerve blocks.  She was not interested in serotonin uptake inhibitors, tricyclic antidepressants, or Lidoderm patch.  Symptomatically, she notes that her pain is the same.  She feels like "a knife is stabbing me".  Pain started when the chest tube came out.  Pain is under her left breast.  She denies any other complaint.   Past Medical History:  Diagnosis Date  . Cancer ([place])   . Coronary artery disease   . Emphysema of lung ([place])   . GERD (gastroesophageal reflux disease)   . Lupus   . Shortness of breath dyspnea     Past Surgical History:  Procedure Laterality Date  . COLONOSCOPY WITH PROPOFOL N/A 06/02/2016   Procedure: COLONOSCOPY WITH PROPOFOL;  Surgeon: [name], MD;  Location: [facility] ENDOSCOPY;  Service: Gastroenterology;  Laterality: N/A;  . CYST EXCISION Left    hand  . ESOPHAGOGASTRODUODENOSCOPY (EGD) WITH PROPOFOL N/A 06/02/2016   Procedure: ESOPHAGOGASTRODUODENOSCOPY (EGD) WITH PROPOFOL;  Surgeon: [name], MD;  Location: [facility] ENDOSCOPY;  Service: Gastroenterology;  Laterality: N/A;  . FLEXIBLE BRONCHOSCOPY N/A 04/17/2016   Procedure: FLEXIBLE BRONCHOSCOPY;  Surgeon: [name], MD;  Location: [facility] ORS;  Service: Thoracic;  Laterality: N/A;  . HAND SURGERY Left   . left upper lobectomy Left 03/2016  . LUNG REMOVAL, PARTIAL Left    Left lower lobe  . THORACOTOMY Left 04/17/2016   Procedure: THORACOTOMY LEFT UPPER LOBE LUNG RESECTION;  Surgeon: [name], MD;   Location: [facility] ORS;  Service: Thoracic;  Laterality: Left;  . TUBAL LIGATION Bilateral    37 years ago    Family History  Problem Relation Age of Onset  . Pancreatic cancer Sister   . Lung cancer Sister   . Lung cancer Brother   . Stomach cancer Paternal Grandmother   . Heart disease Father   . Cancer Daughter 81    cervical  . Cancer Maternal Aunt     brain  . Cancer Maternal Uncle     prostate  . Cancer Maternal Aunt     bone    Social History:  reports that she quit smoking about 6 months ago. Her smoking use included Cigarettes. She has a 8.75 pack-year smoking history. She quit smokeless tobacco use about 6 months ago. She reports that she does not drink alcohol or use drugs.  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  She lives in [place].  The patient is alone today.  Allergies: No Known Allergies  Current Medications: Current Outpatient Prescriptions  Medication Sig Dispense Refill  . gabapentin (NEURONTIN) 100 MG capsule Take 1 capsule (100 mg total) by mouth 3 (three) times daily. 90 capsule 0  . linaclotide (LINZESS) 145 MCG CAPS capsule TAKE 1 CAPSULE (145 MCG TOTAL) BY MOUTH DAILY BEFORE BREAKFAST.    Marland Kitchen linaclotide (LINZESS) 72 MCG capsule Take 1 capsule (72 mcg total) by mouth daily before breakfast. 30 capsule 0  . meloxicam (MOBIC) 7.5 MG tablet  Take by mouth.    Marland Kitchen omeprazole (PRILOSEC) 40 MG capsule Take 1 capsule (40 mg total) by mouth daily. 30 capsule 3  . ondansetron (ZOFRAN) 4 MG tablet Take 4 mg by mouth as needed.     Marland Kitchen oxyCODONE-acetaminophen (PERCOCET/ROXICET) 5-325 MG tablet Take by mouth.    . ranitidine (ZANTAC) 150 MG tablet Take 150 mg by mouth daily.     . sucralfate (CARAFATE) 1 g tablet Take 1 g by mouth 4 (four) times daily -  with meals and at bedtime.     . clarithromycin (BIAXIN) 500 MG tablet TAKE 1 TABLET (500 MG TOTAL) BY MOUTH 2 (TWO) TIMES DAILY.    Marland Kitchen oxyCODONE-acetaminophen (ROXICET) 5-325 MG tablet Take 1 tablet by mouth every  6 (six) hours as needed for severe pain. (Patient not taking: Reported on 09/12/2016) 20 tablet 0   No current facility-administered medications for this visit.     Review of Systems:  GENERAL:  Feels "the same".  No fevers.  Weight up 3 pounds. PERFORMANCE STATUS (ECOG):  1 HEENT:  Poor vision (near and far).  Needs glasses.  No runny nose, sore throat, mouth sores or tenderness. Lungs: Shortness of breath with exertion.  Chest pain at site of chest tube pull (no change).  No cough.  No hemoptysis. Cardiac:  No chest pain, palpitations, orthopnea, or PND. GI:  Constipation.  No nausea, vomiting, diarrhea, melena or hematochezia. GU:  No urgency, frequency, dysuria, or hematuria. Musculoskeletal:  Pain at old chest tube site (under ribs).  No back pain.  No joint pain.  No muscle tenderness. Extremities:  No pain or swelling. Skin:  No rashes or skin changes. Neuro:  No headache, numbness or weakness, balance or coordination issues. Endocrine:  No diabetes, thyroid issues, hot flashes or night sweats. Psych:  Poor sleep.  No mood changes, depression or anxiety. Pain:  Pain at old chest tube site. Review of systems:  All other systems reviewed and found to be negative.  Physical Exam: Blood pressure 139/72, pulse (!) 59, temperature (!) 96.7 F (35.9 C), temperature source Tympanic, resp. rate 18, weight 120 lb 13 oz (54.8 kg). GENERAL:  Thin woman sitting in the exam room in no acute distress. MENTAL STATUS:  Alert and oriented to person, place and time. HEAD:  Wearing a yellow cap.  Brown hair with graying.  Normocephalic, atraumatic, face symmetric, no Cushingoid features. EYES:  Glasses.  Brown eyes.  Pupils equal round and reactive to light and accomodation.  No conjunctivitis or scleral icterus. ENT:  Oropharynx clear without lesion.  Dentures.  Tongue normal. Mucous membranes moist.  RESPIRATORY:  Bilateral breath sounds.  Clear to auscultation without rales, wheezes or  rhonchi. CARDIOVASCULAR:  Regular rate and rhythm without murmur, rub or gallop. CHEST WALL:  Incision well healed. Non-tender to palpation. ABDOMEN:  Soft, non-tender, with active bowel sounds, and no hepatosplenomegaly.  No masses. SKIN:  No rashes, ulcers or lesions. EXTREMITIES: No edema, skin discoloration or tenderness.  No palpable cords. LYMPH NODES: No palpable cervical, supraclavicular, axillary or inguinal adenopathy  NEUROLOGICAL: Unremarkable. PSYCH:  Appropriate.   Appointment on 09/18/2016  Component Date Value Ref Range Status  . Sodium 09/18/2016 140  135 - 145 mmol/L Final  . Potassium 09/18/2016 4.1  3.5 - 5.1 mmol/L Final  . Chloride 09/18/2016 107  101 - 111 mmol/L Final  . CO2 09/18/2016 27  22 - 32 mmol/L Final  . Glucose, Bld 09/18/2016 60* 65 - 99 mg/dL Final  .  BUN 09/18/2016 8  6 - 20 mg/dL Final  . Creatinine, Ser 09/18/2016 0.68  0.44 - 1.00 mg/dL Final  . Calcium 09/18/2016 8.9  8.9 - 10.3 mg/dL Final  . Total Protein 09/18/2016 6.9  6.5 - 8.1 g/dL Final  . Albumin 09/18/2016 3.8  3.5 - 5.0 g/dL Final  . AST 09/18/2016 15  15 - 41 U/L Final  . ALT 09/18/2016 11* 14 - 54 U/L Final  . Alkaline Phosphatase 09/18/2016 54  38 - 126 U/L Final  . Total Bilirubin 09/18/2016 0.6  0.3 - 1.2 mg/dL Final  . GFR calc non Af Amer 09/18/2016 >60  >60 mL/min Final  . GFR calc Af Amer 09/18/2016 >60  >60 mL/min Final   Comment: (NOTE) The eGFR has been calculated using the CKD EPI equation. This calculation has not been validated in all clinical situations. eGFR's persistently <60 mL/min signify possible Chronic Kidney Disease.   . Anion gap 09/18/2016 6  5 - 15 Final  . WBC 09/18/2016 5.7  3.6 - 11.0 K/uL Final  . RBC 09/18/2016 4.00  3.80 - 5.20 MIL/uL Final  . Hemoglobin 09/18/2016 11.8* 12.0 - 16.0 g/dL Final  . HCT 09/18/2016 35.7  35.0 - 47.0 % Final  . MCV 09/18/2016 89.2  80.0 - 100.0 fL Final  . MCH 09/18/2016 29.6  26.0 - 34.0 pg Final  . MCHC  09/18/2016 33.2  32.0 - 36.0 g/dL Final  . RDW 09/18/2016 12.7  11.5 - 14.5 % Final  . Platelets 09/18/2016 406  150 - 440 K/uL Final  . Neutrophils Relative % 09/18/2016 51  % Final  . Neutro Abs 09/18/2016 2.9  1.4 - 6.5 K/uL Final  . Lymphocytes Relative 09/18/2016 40  % Final  . Lymphs Abs 09/18/2016 2.3  1.0 - 3.6 K/uL Final  . Monocytes Relative 09/18/2016 8  % Final  . Monocytes Absolute 09/18/2016 0.4  0.2 - 0.9 K/uL Final  . Eosinophils Relative 09/18/2016 0  % Final  . Eosinophils Absolute 09/18/2016 0.0  0 - 0.7 K/uL Final  . Basophils Relative 09/18/2016 1  % Final  . Basophils Absolute 09/18/2016 0.0  0 - 0.1 K/uL Final    Assessment:  Sheila Suarez is a 61 y.o. female with lupus x 8 years.  CT angiogram of the chest, abdominal and pelvis on 02/17/2016 revealed a 1.1 cm spiculated lesion in the left upper lobe increased from prior exam (08/30/2010). There were stable small pulmonary nodules bilaterally (2-3 mm).  She has a 15 pack year smoking history.  PET scan on 02/15/2016 revealed an irregular 1.0 cm apical LUL pulmonary nodule (SUV 0.7).  There was a 4 mm right middle lobe solid pulmonary nodule (stable since 08/02/2010) and two additional right lower lobe 3 mm pulmonary nodules.  PFTs revealed an FEV1 was 84% and her DLCO was 67%.  She underwent left thoracotomy with left upper lobe lobectomy on 04/17/2016.  Pathology revealed subpleural fibro-elastotic scar consistent with benign pulmonary cap.  There was no evidence of malignancy.  There were 2 benign lymph nodes.  She had a pneumothorax requiring chest tube placement.  She has received the series of rabies injections after being bitten by a stray dog.   EGD and colonoscopy were normal on 06/02/2016.  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  There was no dysplasia or malignancy.  She began treatment for H pylori on 06/17/2016.  She notes a history of 16 pound weight loss over an  unclear period of time.  Her weight has been stable since 02/09/2016.  TSH was normal on 02/09/2016.  Symptomatically, she has persistent pain at the site where her chest tube was pulled.  Weight is up 3 pounds.  Plan: 1.  Labs today:  CBC with diff, CMP. 2.  Phone follow-up with Dr Genevive Bi- unclear etiology of pain. 3.  Discuss pain clinic consult and nerve block. 4.  Nurse to assist with second opinion regarding post-op pain management in [place] 5.  RTC prn.   Lequita Asal, MD  09/18/2016, 11:30 AM

## 2016-09-18 ENCOUNTER — Inpatient Hospital Stay: Payer: BLUE CROSS/BLUE SHIELD | Attending: Hematology and Oncology

## 2016-09-18 ENCOUNTER — Inpatient Hospital Stay (HOSPITAL_BASED_OUTPATIENT_CLINIC_OR_DEPARTMENT_OTHER): Payer: BLUE CROSS/BLUE SHIELD | Admitting: Hematology and Oncology

## 2016-09-18 ENCOUNTER — Encounter: Payer: Self-pay | Admitting: Hematology and Oncology

## 2016-09-18 VITALS — BP 139/72 | HR 59 | Temp 96.7°F | Resp 18 | Wt 120.8 lb

## 2016-09-18 DIAGNOSIS — Z800 Family history of malignant neoplasm of digestive organs: Secondary | ICD-10-CM

## 2016-09-18 DIAGNOSIS — Z801 Family history of malignant neoplasm of trachea, bronchus and lung: Secondary | ICD-10-CM

## 2016-09-18 DIAGNOSIS — Z8042 Family history of malignant neoplasm of prostate: Secondary | ICD-10-CM

## 2016-09-18 DIAGNOSIS — R0781 Pleurodynia: Secondary | ICD-10-CM | POA: Diagnosis not present

## 2016-09-18 DIAGNOSIS — R911 Solitary pulmonary nodule: Secondary | ICD-10-CM | POA: Insufficient documentation

## 2016-09-18 DIAGNOSIS — Z79899 Other long term (current) drug therapy: Secondary | ICD-10-CM

## 2016-09-18 DIAGNOSIS — R634 Abnormal weight loss: Secondary | ICD-10-CM

## 2016-09-18 DIAGNOSIS — F17210 Nicotine dependence, cigarettes, uncomplicated: Secondary | ICD-10-CM | POA: Diagnosis not present

## 2016-09-18 DIAGNOSIS — K219 Gastro-esophageal reflux disease without esophagitis: Secondary | ICD-10-CM | POA: Insufficient documentation

## 2016-09-18 DIAGNOSIS — J439 Emphysema, unspecified: Secondary | ICD-10-CM

## 2016-09-18 DIAGNOSIS — I2510 Atherosclerotic heart disease of native coronary artery without angina pectoris: Secondary | ICD-10-CM

## 2016-09-18 DIAGNOSIS — M329 Systemic lupus erythematosus, unspecified: Secondary | ICD-10-CM | POA: Insufficient documentation

## 2016-09-18 DIAGNOSIS — B9681 Helicobacter pylori [H. pylori] as the cause of diseases classified elsewhere: Secondary | ICD-10-CM | POA: Diagnosis not present

## 2016-09-18 DIAGNOSIS — K2970 Gastritis, unspecified, without bleeding: Secondary | ICD-10-CM

## 2016-09-18 DIAGNOSIS — G4700 Insomnia, unspecified: Secondary | ICD-10-CM | POA: Diagnosis not present

## 2016-09-18 LAB — CBC WITH DIFFERENTIAL/PLATELET
Basophils Absolute: 0 10*3/uL (ref 0–0.1)
Basophils Relative: 1 %
Eosinophils Absolute: 0 10*3/uL (ref 0–0.7)
Eosinophils Relative: 0 %
HCT: 35.7 % (ref 35.0–47.0)
Hemoglobin: 11.8 g/dL — ABNORMAL LOW (ref 12.0–16.0)
Lymphocytes Relative: 40 %
Lymphs Abs: 2.3 10*3/uL (ref 1.0–3.6)
MCH: 29.6 pg (ref 26.0–34.0)
MCHC: 33.2 g/dL (ref 32.0–36.0)
MCV: 89.2 fL (ref 80.0–100.0)
Monocytes Absolute: 0.4 10*3/uL (ref 0.2–0.9)
Monocytes Relative: 8 %
Neutro Abs: 2.9 10*3/uL (ref 1.4–6.5)
Neutrophils Relative %: 51 %
Platelets: 406 10*3/uL (ref 150–440)
RBC: 4 MIL/uL (ref 3.80–5.20)
RDW: 12.7 % (ref 11.5–14.5)
WBC: 5.7 10*3/uL (ref 3.6–11.0)

## 2016-09-18 LAB — COMPREHENSIVE METABOLIC PANEL
ALT: 11 U/L — ABNORMAL LOW (ref 14–54)
AST: 15 U/L (ref 15–41)
Albumin: 3.8 g/dL (ref 3.5–5.0)
Alkaline Phosphatase: 54 U/L (ref 38–126)
Anion gap: 6 (ref 5–15)
BUN: 8 mg/dL (ref 6–20)
CO2: 27 mmol/L (ref 22–32)
Calcium: 8.9 mg/dL (ref 8.9–10.3)
Chloride: 107 mmol/L (ref 101–111)
Creatinine, Ser: 0.68 mg/dL (ref 0.44–1.00)
GFR calc Af Amer: >60 mL/min (ref >60)
GFR calc non Af Amer: >60 mL/min (ref >60)
Glucose, Bld: 60 mg/dL — ABNORMAL LOW (ref 65–99)
Potassium: 4.1 mmol/L (ref 3.5–5.1)
Sodium: 140 mmol/L (ref 135–145)
Total Bilirubin: 0.6 mg/dL (ref 0.3–1.2)
Total Protein: 6.9 g/dL (ref 6.5–8.1)

## 2016-09-18 NOTE — Progress Notes (Signed)
Patient states she stays constipated.  She also has been to the pain clinic but refused treatment there because she did not want the injections in her back and did not want the anti-depressant they offered.   States she continues to have pain in upper ribcage under her left breast.

## 2016-09-19 NOTE — Progress Notes (Unsigned)
PSN met with patient yesterday.  Patient is waiting to hear back from [place].  Currently, patient has no income.  PSN will assist patient with some of her monthly expenses through the [facility].

## 2016-09-29 ENCOUNTER — Institutional Professional Consult (permissible substitution): Payer: BLUE CROSS/BLUE SHIELD | Admitting: Internal Medicine

## 2016-10-16 ENCOUNTER — Telehealth: Payer: Self-pay | Admitting: Internal Medicine

## 2016-10-16 ENCOUNTER — Ambulatory Visit (INDEPENDENT_AMBULATORY_CARE_PROVIDER_SITE_OTHER): Payer: BLUE CROSS/BLUE SHIELD | Admitting: Internal Medicine

## 2016-10-16 ENCOUNTER — Encounter: Payer: Self-pay | Admitting: Internal Medicine

## 2016-10-16 ENCOUNTER — Telehealth: Payer: Self-pay

## 2016-10-16 VITALS — BP 122/64 | HR 61 | Ht 65.0 in | Wt 121.4 lb

## 2016-10-16 DIAGNOSIS — J4530 Mild persistent asthma, uncomplicated: Secondary | ICD-10-CM | POA: Diagnosis not present

## 2016-10-16 DIAGNOSIS — R0602 Shortness of breath: Secondary | ICD-10-CM

## 2016-10-16 MED ORDER — GLYCOPYRROLATE-FORMOTEROL 9-4.8 MCG/ACT IN AERO: 2.0000 | INHALATION_SPRAY | Freq: Two times a day (BID) | RESPIRATORY_TRACT | 5 refills | Status: DC

## 2016-10-16 MED ORDER — GLYCOPYRROLATE-FORMOTEROL 9-4.8 MCG/ACT IN AERO: 2.0000 | INHALATION_SPRAY | Freq: Two times a day (BID) | RESPIRATORY_TRACT | 0 refills | Status: AC

## 2016-10-16 MED ORDER — ALBUTEROL SULFATE HFA 108 (90 BASE) MCG/ACT IN AERS: 2.0000 | INHALATION_SPRAY | Freq: Four times a day (QID) | RESPIRATORY_TRACT | 2 refills | Status: DC | PRN

## 2016-10-16 MED ORDER — UMECLIDINIUM-VILANTEROL 62.5-25 MCG/INH IN AEPB: 1.0000 | INHALATION_SPRAY | Freq: Every day | RESPIRATORY_TRACT | 3 refills | Status: AC

## 2016-10-16 NOTE — Telephone Encounter (Signed)
Note routed Primary Care not a patient at [facility] per chart.

## 2016-10-16 NOTE — Telephone Encounter (Signed)
Patient brought information for social security lawyer to give to nurse per conversation . Placed note in nurse box .  Patient needs a letter explaining limitations .  Please see note and call patient to discuss.

## 2016-10-16 NOTE — Patient Instructions (Signed)
START BEVESPI ALBUTEROL AS NEEDED CT CHEST WITHOUT CONTRAST CHECK ONO AND 6MWT   Follow up in 1 month

## 2016-10-16 NOTE — Progress Notes (Signed)
Patient ID: Sheila Suarez, female   DOB: 1955-08-16, 61 y.o.   MRN: [id number] Patient seen in the office today and instructed on use of bevespi aeroshere.  Patient expressed understanding and demonstrated technique.

## 2016-10-16 NOTE — Telephone Encounter (Addendum)
Received fax from [facility] [address], requesting med alternative for bevespi.  Per DK pt to try Anoro. I have spoken with [facility], who did a test claim and states that anoro is approved but has a high co pay. Pt is aware that we will leave a 10 dollar coupon up front for pick up, as pt has [place]. Rx sent to preferred pharmacy. Will route to [facility] to place coupon up front, as I am in the [place] office. Thanks.  Nothing further needed

## 2016-10-16 NOTE — Addendum Note (Signed)
Addended by: Maryanna Shape A on: 10/16/2016 05:05 PM   Modules accepted: Orders

## 2016-10-16 NOTE — Progress Notes (Signed)
[facility] Consultation      Date: 10/16/2016,   MRN# [id number] Taleigha Pinson 1956-04-25 Code Status:  Code Status History    Date Active Date Inactive Code Status Order ID Comments User Context   04/28/2016 12:21 PM 05/04/2016  3:22 PM Full Code [id number]  [name], MD Inpatient   04/17/2016  2:56 PM 04/24/2016  4:20 PM Full Code [id number]  [name], MD Inpatient     Hosp day:'@LENGTHOFSTAYDAYS'$ @ Referring MD: '@ATDPROV'$ @     PCP:      AdmissionWeight: 121 lb 6.4 oz (55.1 kg)                 CurrentWeight: 121 lb 6.4 oz (55.1 kg) [name] is a 61 y.o. old female seen in consultation for SOB at the request of Dr. Genevive Bi     CHIEF COMPLAINT:   SOB   HISTORY OF PRESENT ILLNESS   61 AAF with chronic tobacco abuse Quit smoking 03/2016 Had dx of LUL nodule since 2012 Underwent LUL lung resection by Dr Genevive Bi 03/2016 as requested by oncology Benign findings per path reprots  Has had SOB with exertion since surgery associated with intermittent wheezing and left sided chest wall pain Cold air makes breathing worse Has limited her quality of life-wants disability papers filled Has not had any type of inhaler therapy    Also with chest wall pain since surgery Has seen pain clinic for and was told she can get injection BUT has refused  She has no signs of infection at this time    PAST MEDICAL HISTORY   Past Medical History:  Diagnosis Date  . Cancer ([place])   . Coronary artery disease   . Emphysema of lung ([place])   . GERD (gastroesophageal reflux disease)   . Lupus   . Shortness of breath dyspnea      SURGICAL HISTORY   Past Surgical History:  Procedure Laterality Date  . COLONOSCOPY WITH PROPOFOL N/A 06/02/2016   Procedure: COLONOSCOPY WITH PROPOFOL;  Surgeon: [name], MD;  Location: [facility] ENDOSCOPY;  Service: Gastroenterology;  Laterality: N/A;  . CYST EXCISION Left    hand  . ESOPHAGOGASTRODUODENOSCOPY (EGD) WITH PROPOFOL  N/A 06/02/2016   Procedure: ESOPHAGOGASTRODUODENOSCOPY (EGD) WITH PROPOFOL;  Surgeon: [name], MD;  Location: [facility] ENDOSCOPY;  Service: Gastroenterology;  Laterality: N/A;  . FLEXIBLE BRONCHOSCOPY N/A 04/17/2016   Procedure: FLEXIBLE BRONCHOSCOPY;  Surgeon: [name], MD;  Location: [facility] ORS;  Service: Thoracic;  Laterality: N/A;  . HAND SURGERY Left   . left upper lobectomy Left 03/2016  . LUNG REMOVAL, PARTIAL Left    Left lower lobe  . THORACOTOMY Left 04/17/2016   Procedure: THORACOTOMY LEFT UPPER LOBE LUNG RESECTION;  Surgeon: [name], MD;  Location: [facility] ORS;  Service: Thoracic;  Laterality: Left;  . TUBAL LIGATION Bilateral    37 years ago     FAMILY HISTORY   Family History  Problem Relation Age of Onset  . Pancreatic cancer Sister   . Lung cancer Sister   . Lung cancer Brother   . Stomach cancer Paternal Grandmother   . Heart disease Father   . Cancer Daughter 58    cervical  . Cancer Maternal Aunt     brain  . Cancer Maternal Uncle     prostate  . Cancer Maternal Aunt     bone     SOCIAL HISTORY   Social History  Substance Use Topics  . Smoking status: Former Smoker  Packs/day: 0.25    Years: 35.00    Types: Cigarettes    Quit date: 03/19/2016  . Smokeless tobacco: Former Systems developer    Quit date: 03/16/2016  . Alcohol use 0.6 oz/week    1 Cans of beer per week     Comment: Rarely     MEDICATIONS    Home Medication:  Current Outpatient Rx  . Order #: [id number] Class: Historical Med  . Order #: [id number] Class: Normal  . Order #: [id number] Class: Historical Med  . Order #: [id number] Class: Normal  . Order #: [id number] Class: Historical Med  . Order #: [id number] Class: Historical Med  . Order #: [id number] Class: Historical Med  . Order #: [id number] Class: Print  . Order #: [id number] Class: Historical Med  . Order #: [id number] Class: Historical Med  . Order #: [id number] Class: Normal    Current Medication:  Current Outpatient Prescriptions:  .   clarithromycin (BIAXIN) 500 MG tablet, TAKE 1 TABLET (500 MG TOTAL) BY MOUTH 2 (TWO) TIMES DAILY., Disp: , Rfl:  .  gabapentin (NEURONTIN) 100 MG capsule, Take 1 capsule (100 mg total) by mouth 3 (three) times daily., Disp: 90 capsule, Rfl: 0 .  linaclotide (LINZESS) 145 MCG CAPS capsule, TAKE 1 CAPSULE (145 MCG TOTAL) BY MOUTH DAILY BEFORE BREAKFAST., Disp: , Rfl:  .  linaclotide (LINZESS) 72 MCG capsule, Take 1 capsule (72 mcg total) by mouth daily before breakfast., Disp: 30 capsule, Rfl: 0 .  meloxicam (MOBIC) 7.5 MG tablet, Take by mouth., Disp: , Rfl:  .  ondansetron (ZOFRAN) 4 MG tablet, Take 4 mg by mouth as needed. , Disp: , Rfl:  .  oxyCODONE-acetaminophen (PERCOCET/ROXICET) 5-325 MG tablet, Take by mouth., Disp: , Rfl:  .  oxyCODONE-acetaminophen (ROXICET) 5-325 MG tablet, Take 1 tablet by mouth every 6 (six) hours as needed for severe pain., Disp: 20 tablet, Rfl: 0 .  ranitidine (ZANTAC) 150 MG tablet, Take 150 mg by mouth daily. , Disp: , Rfl:  .  sucralfate (CARAFATE) 1 g tablet, Take 1 g by mouth 4 (four) times daily -  with meals and at bedtime. , Disp: , Rfl:  .  omeprazole (PRILOSEC) 40 MG capsule, Take 1 capsule (40 mg total) by mouth daily., Disp: 30 capsule, Rfl: 3    ALLERGIES   Patient has no known allergies.     REVIEW OF SYSTEMS   Review of Systems  Constitutional: Negative for chills, diaphoresis, fever, malaise/fatigue and weight loss.  HENT: Negative for congestion and hearing loss.   Eyes: Negative for blurred vision and double vision.  Respiratory: Positive for shortness of breath and wheezing. Negative for cough, hemoptysis and sputum production.   Cardiovascular: Positive for chest pain. Negative for palpitations, orthopnea and leg swelling.  Gastrointestinal: Negative for abdominal pain, heartburn, nausea and vomiting.  Genitourinary: Negative for dysuria and urgency.  Musculoskeletal: Negative for back pain, myalgias and neck pain.  Skin: Negative for  rash.  Neurological: Negative for dizziness, tingling, tremors, weakness and headaches.  Endo/Heme/Allergies: Does not bruise/bleed easily.  Psychiatric/Behavioral: The patient is nervous/anxious.   All other systems reviewed and are negative.    VS: BP 122/64 (BP Location: Left Arm, Cuff Size: Normal)   Pulse 61   Ht '5\' 5"'$  (1.651 m)   Wt 121 lb 6.4 oz (55.1 kg)   SpO2 99%   BMI 20.20 kg/m      PHYSICAL EXAM  Physical Exam  Constitutional: She is oriented to person, place, and time. She appears well-developed and well-nourished. No distress.  HENT:  Head: Normocephalic and atraumatic.  Mouth/Throat: No oropharyngeal exudate.  Eyes: EOM are normal. Pupils are equal, round, and reactive to light. No scleral icterus.  Neck: Normal range of motion. Neck supple.  Cardiovascular: Normal rate, regular rhythm and normal heart sounds.   No murmur heard. Pulmonary/Chest: No stridor. No respiratory distress. She has no wheezes.  Left chest wall pain with palpation  Abdominal: Soft. Bowel sounds are normal.  Musculoskeletal: Normal range of motion. She exhibits no edema.  Neurological: She is alert and oriented to person, place, and time. No cranial nerve deficit.  Skin: Skin is warm. She is not diaphoretic.  Psychiatric: She has a normal mood and affect.      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 All questions answered Follow up in 3 months  Ledford Goodson Patricia Pesa, M.D.  [facility]  Medical Director [place] Director [facility] Cardio-Pulmonary Department

## 2016-10-17 ENCOUNTER — Encounter: Payer: Self-pay | Admitting: Internal Medicine

## 2016-10-17 DIAGNOSIS — R0602 Shortness of breath: Secondary | ICD-10-CM

## 2016-10-17 NOTE — Telephone Encounter (Signed)
Per DK, pt has no limitations. Informed pt and she states she knows what she can and can't do. Informed pt per DK she can call her PCP if she needs. Nothing further needed.

## 2016-10-19 MED ORDER — UMECLIDINIUM-VILANTEROL 62.5-25 MCG/INH IN AEPB: 1.0000 | INHALATION_SPRAY | Freq: Every day | RESPIRATORY_TRACT | 3 refills | Status: AC

## 2016-10-20 ENCOUNTER — Telehealth: Payer: Self-pay | Admitting: *Deleted

## 2016-10-20 NOTE — Telephone Encounter (Signed)
Pt informed of ONO results and that she doesn't need O2 for night time use. Nothing further needed.

## 2016-10-20 NOTE — Telephone Encounter (Signed)
Coupon placed up front. Nothing further needed.

## 2016-10-25 ENCOUNTER — Ambulatory Visit: Payer: BLUE CROSS/BLUE SHIELD | Admitting: Gastroenterology

## 2016-11-05 IMAGING — CR DG CHEST 2V
1 series · 2 of 2 positions shown · non-contrast
Comparison: 05/10/2016

CLINICAL DATA: Thoracotomy [DATE]. History of lung cancer.
Pneumothorax. Persistent left-sided pain.

EXAM:
CHEST  2 VIEW

[Series 3: w chest pa · 0.14mm/px · 2 of 2 slices shown]
[im 1/2]
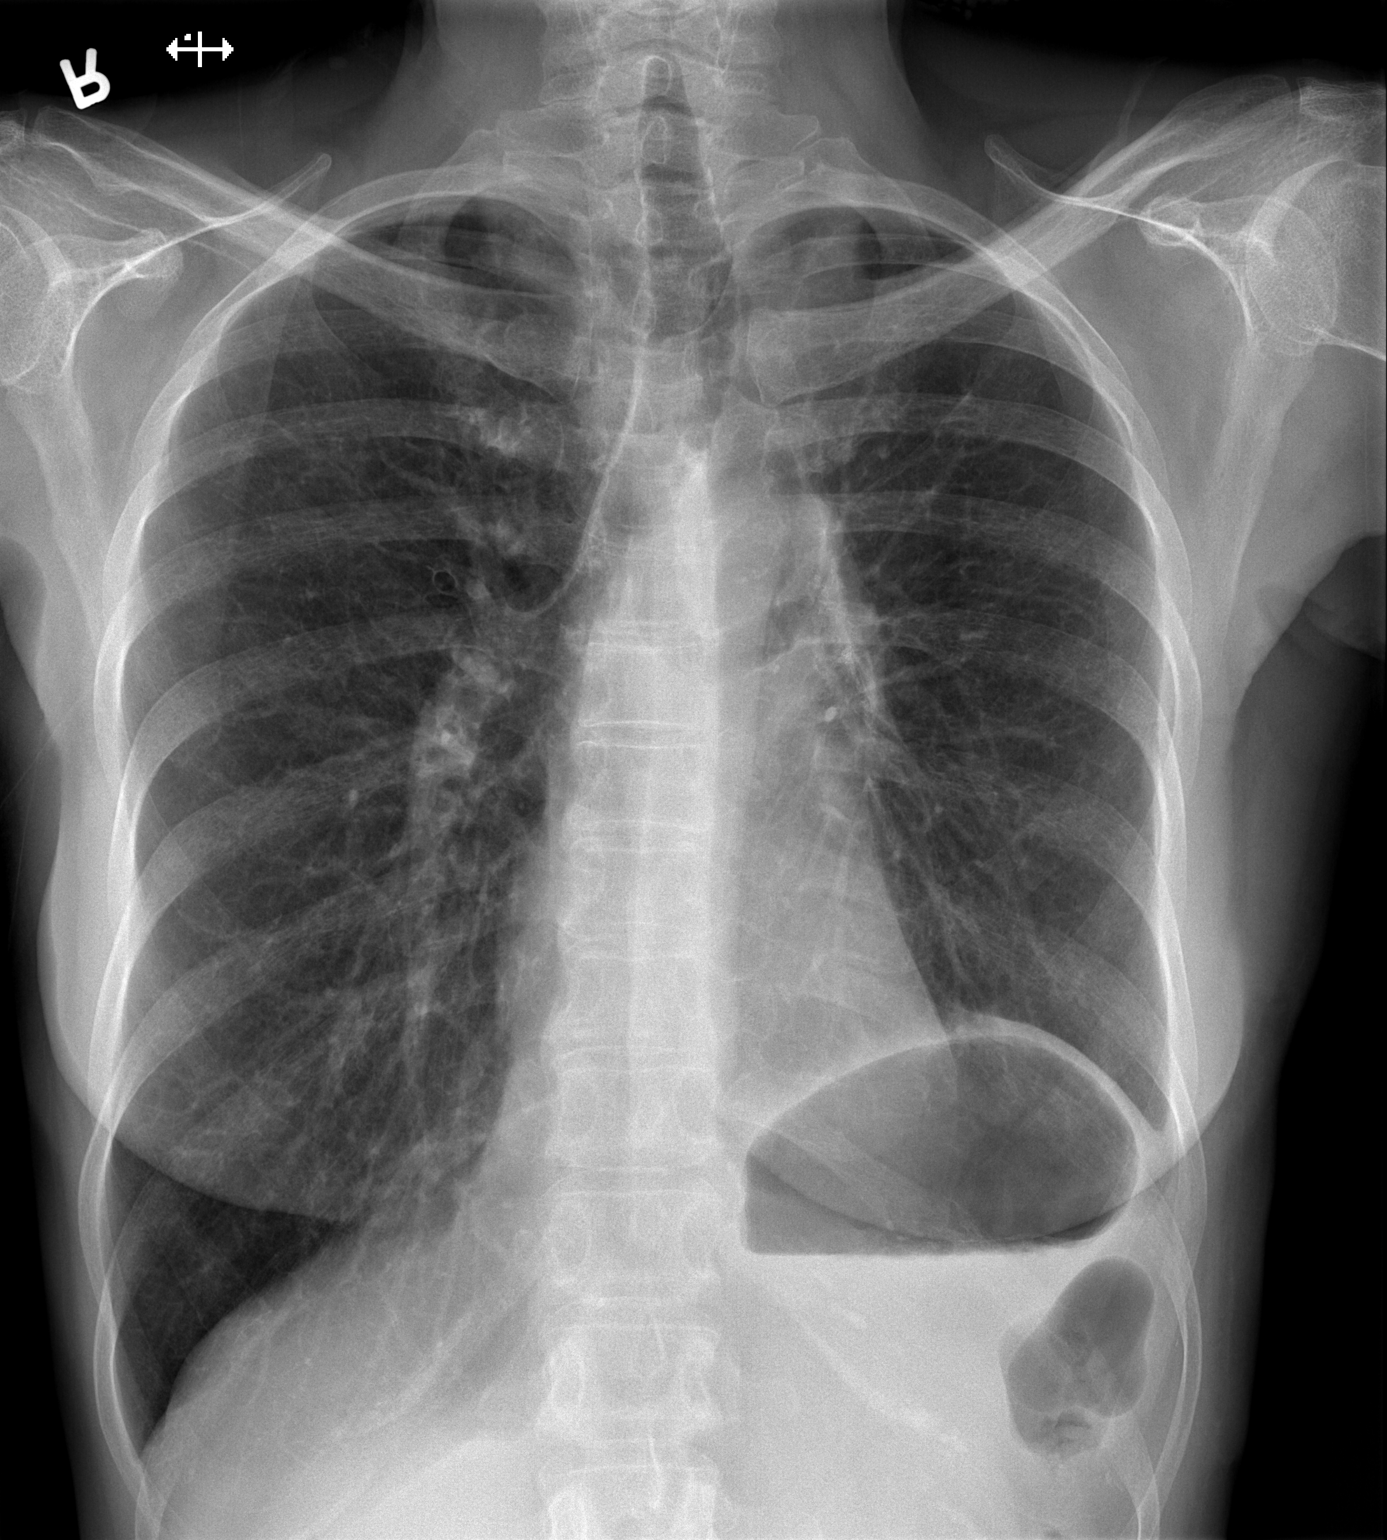
[im 2/2]
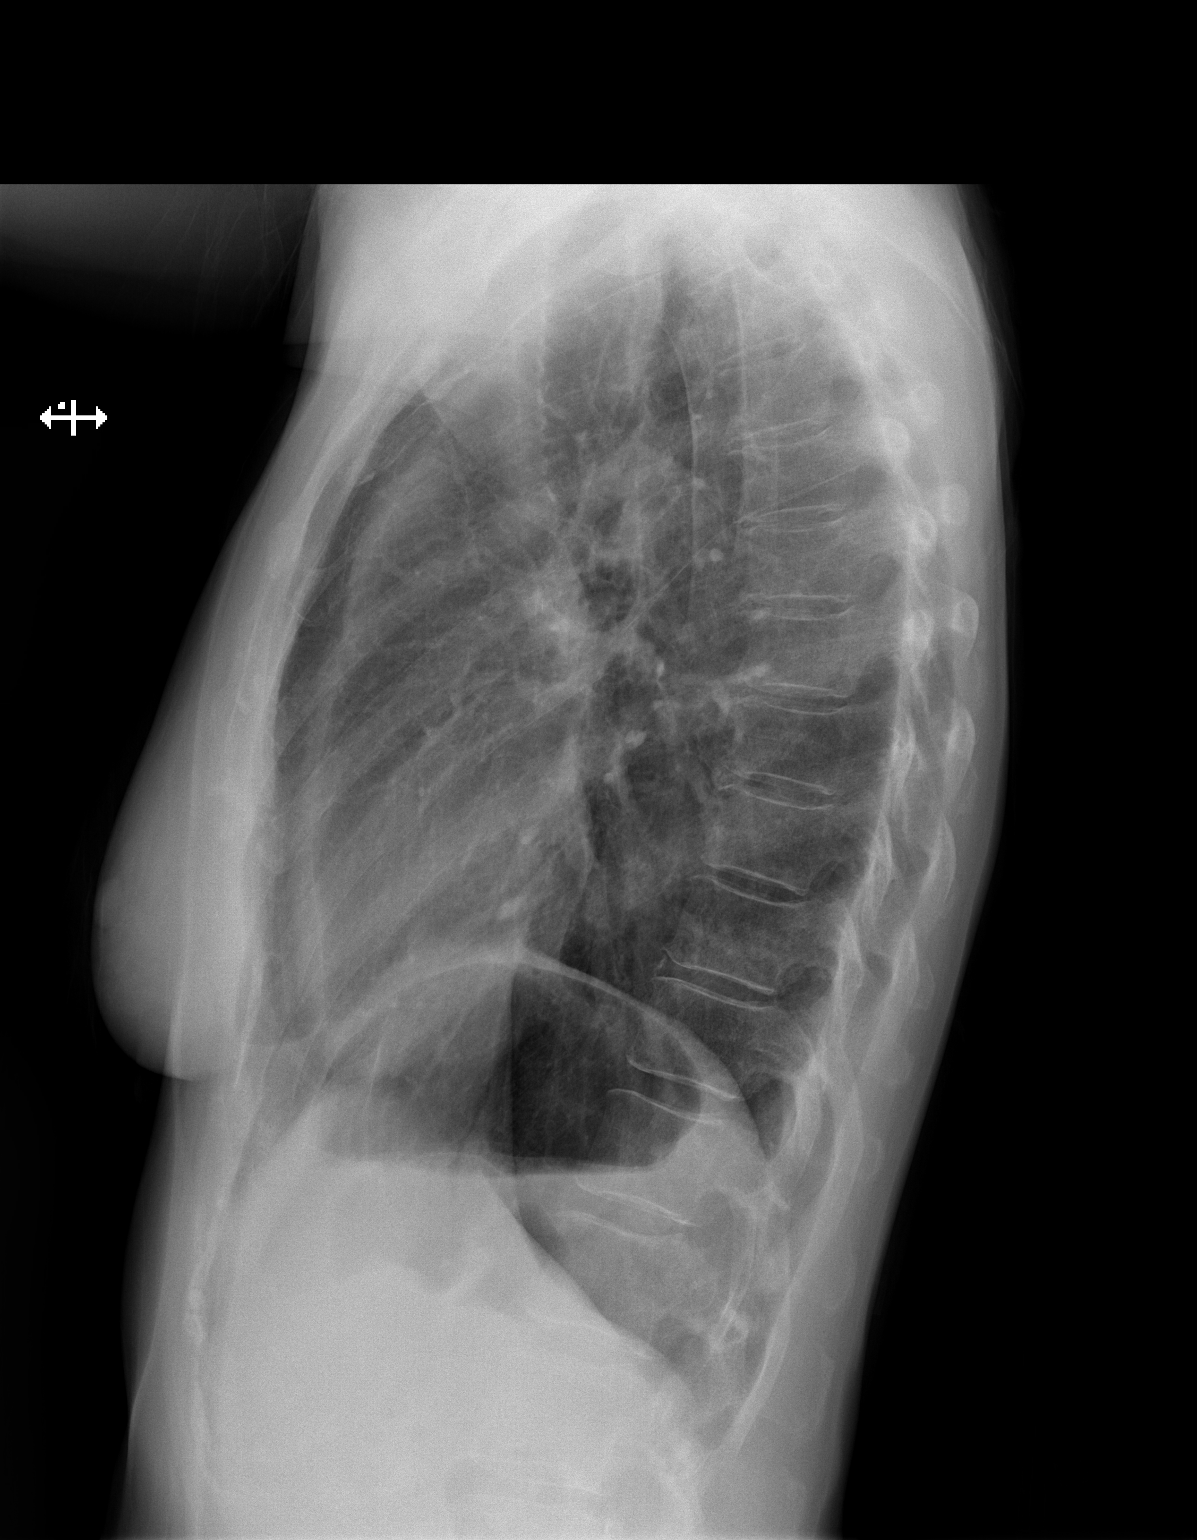

[2 of 2 positions shown; findings below may reference images not displayed]

FINDINGS: Postoperative left chest with volume loss and hilar distortion. Lung
sutures over the left hilum. In the lateral projection there is
meniscus type opacity anterior to the heart. Favor small residual
hydro pneumothorax over postoperative pleural distortion. Normal
heart size and mediastinal contours. Hyperinflation of the right
lung.
IMPRESSION: Probable small loculated anterior left hydropneumothorax, unchanged.

## 2016-11-08 ENCOUNTER — Ambulatory Visit: Payer: BLUE CROSS/BLUE SHIELD

## 2016-11-14 ENCOUNTER — Ambulatory Visit: Payer: BLUE CROSS/BLUE SHIELD | Admitting: Anesthesiology

## 2016-11-15 ENCOUNTER — Other Ambulatory Visit: Payer: Self-pay | Admitting: *Deleted

## 2016-11-15 ENCOUNTER — Ambulatory Visit (INDEPENDENT_AMBULATORY_CARE_PROVIDER_SITE_OTHER): Payer: BLUE CROSS/BLUE SHIELD | Admitting: *Deleted

## 2016-11-15 DIAGNOSIS — R0602 Shortness of breath: Secondary | ICD-10-CM

## 2016-11-15 MED ORDER — UMECLIDINIUM-VILANTEROL 62.5-25 MCG/INH IN AEPB: 1.0000 | INHALATION_SPRAY | Freq: Every day | RESPIRATORY_TRACT | 4 refills | Status: DC

## 2016-11-15 NOTE — Progress Notes (Signed)
SMW performed today. 

## 2016-11-16 IMAGING — DX DG CHEST 1V PORT
1 series · 1 of 1 positions shown · non-contrast
Comparison: 05/26/2016

CLINICAL DATA: Upper central chest pain since 3100 hours today.
Pain is worse with inspiration.

EXAM:
PORTABLE CHEST 1 VIEW

[chest ap]
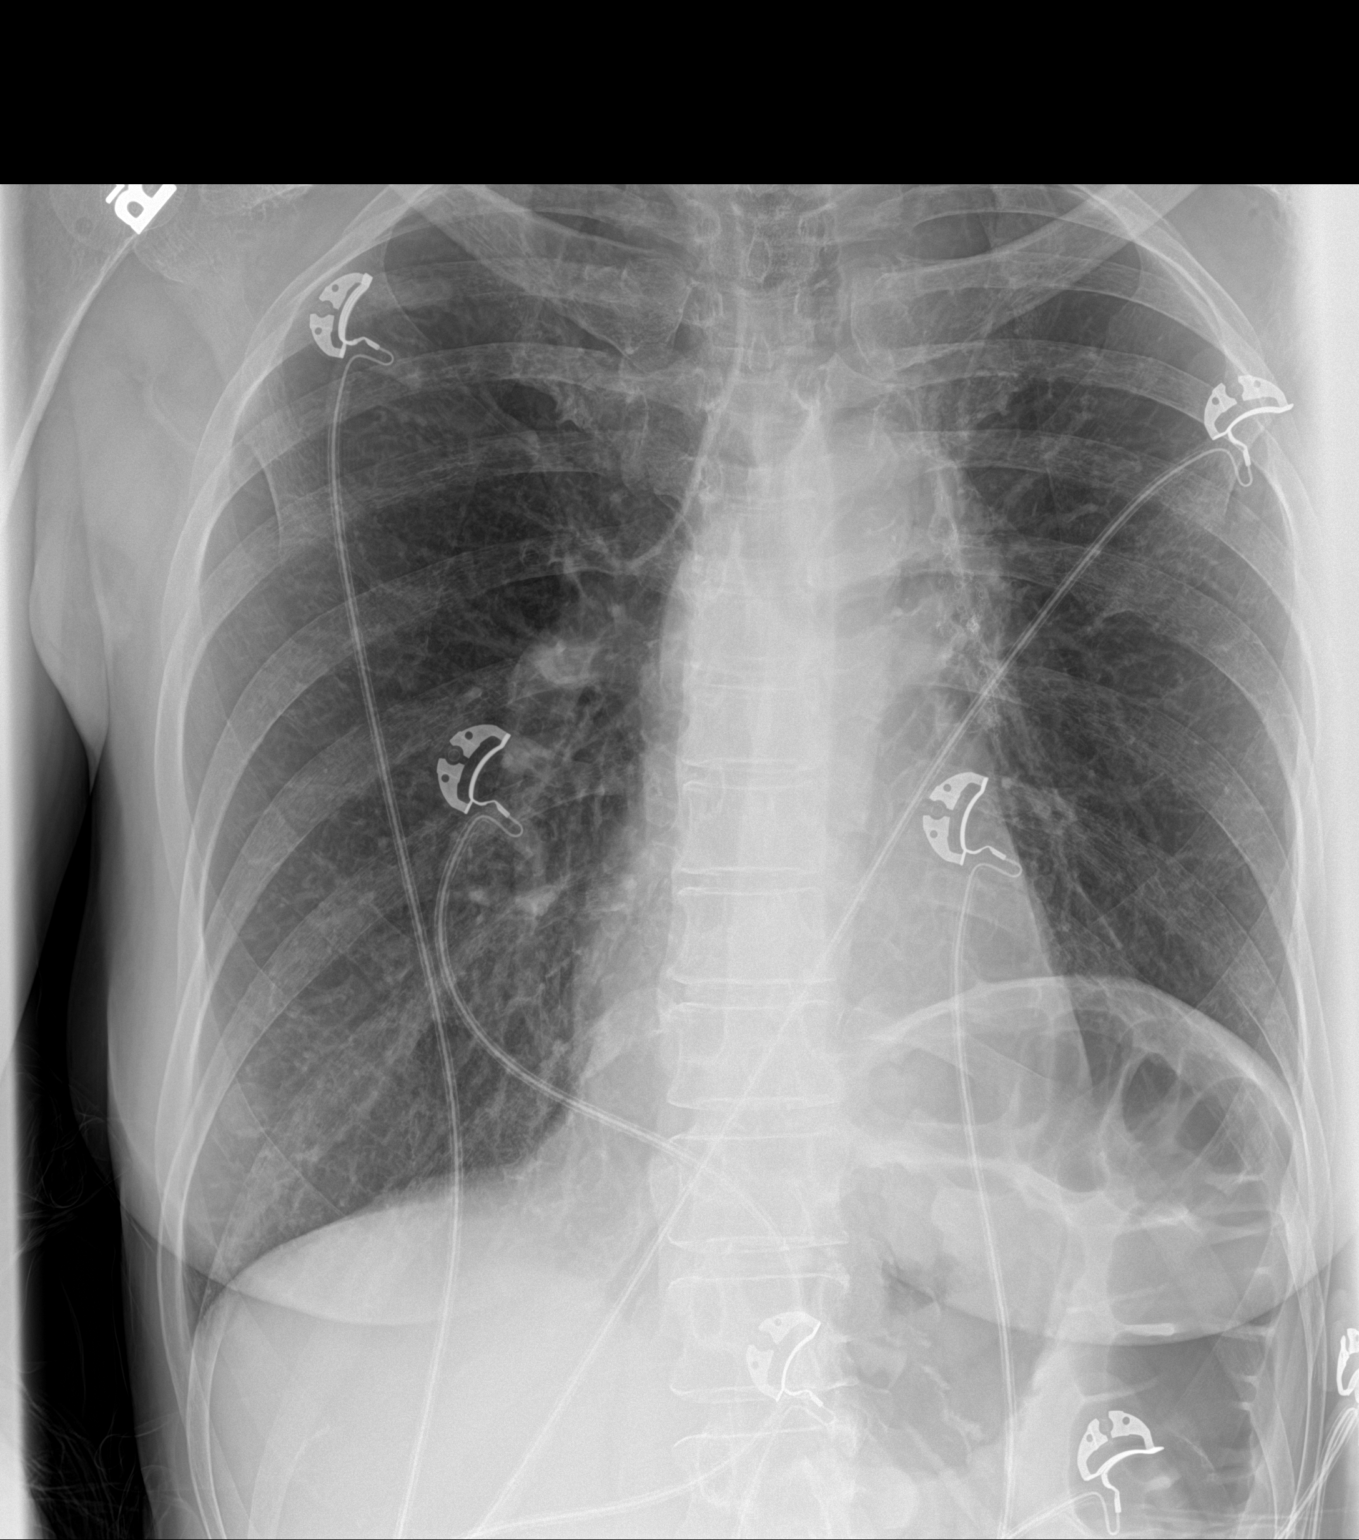

[1 of 1 positions shown; findings below may reference images not displayed]

FINDINGS: Volume loss of the left lung status post reported left lower
lobectomy with chain sutures noted about the left hilum as before.
There is hyperinflation of right lung. The cardiac and mediastinal
contours are stable in appearance with atherosclerosis of the aortic
arch. The lungs are free of pneumonic consolidations. Pulmonary
vasculature is unremarkable. No effusion or pneumothorax. No
suspicious osseous abnormalities.
IMPRESSION: Stable postoperative appearance of the chest with volume loss on the
left. Compensatory hyperinflation of the right.

## 2016-11-17 ENCOUNTER — Ambulatory Visit: Payer: BLUE CROSS/BLUE SHIELD | Attending: Anesthesiology | Admitting: Anesthesiology

## 2016-11-17 ENCOUNTER — Encounter: Payer: Self-pay | Admitting: Anesthesiology

## 2016-11-17 ENCOUNTER — Ambulatory Visit
Admission: RE | Admit: 2016-11-17 | Discharge: 2016-11-17 | Disposition: A | Discharge status: Home / Self Care | Payer: BLUE CROSS/BLUE SHIELD | Source: Ambulatory Visit | Attending: Internal Medicine | Admitting: Internal Medicine

## 2016-11-17 VITALS — BP 139/65 | HR 58 | Temp 97.6°F | Resp 16 | Ht 65.0 in | Wt 121.0 lb

## 2016-11-17 DIAGNOSIS — M329 Systemic lupus erythematosus, unspecified: Secondary | ICD-10-CM | POA: Diagnosis not present

## 2016-11-17 DIAGNOSIS — I700 Atherosclerosis of aorta: Secondary | ICD-10-CM | POA: Diagnosis not present

## 2016-11-17 DIAGNOSIS — Z9851 Tubal ligation status: Secondary | ICD-10-CM | POA: Diagnosis not present

## 2016-11-17 DIAGNOSIS — M25519 Pain in unspecified shoulder: Secondary | ICD-10-CM | POA: Insufficient documentation

## 2016-11-17 DIAGNOSIS — Z8042 Family history of malignant neoplasm of prostate: Secondary | ICD-10-CM | POA: Diagnosis not present

## 2016-11-17 DIAGNOSIS — R109 Unspecified abdominal pain: Secondary | ICD-10-CM | POA: Insufficient documentation

## 2016-11-17 DIAGNOSIS — M546 Pain in thoracic spine: Secondary | ICD-10-CM | POA: Insufficient documentation

## 2016-11-17 DIAGNOSIS — J439 Emphysema, unspecified: Secondary | ICD-10-CM | POA: Insufficient documentation

## 2016-11-17 DIAGNOSIS — Z8249 Family history of ischemic heart disease and other diseases of the circulatory system: Secondary | ICD-10-CM | POA: Insufficient documentation

## 2016-11-17 DIAGNOSIS — F17210 Nicotine dependence, cigarettes, uncomplicated: Secondary | ICD-10-CM | POA: Insufficient documentation

## 2016-11-17 DIAGNOSIS — Z79899 Other long term (current) drug therapy: Secondary | ICD-10-CM | POA: Insufficient documentation

## 2016-11-17 DIAGNOSIS — Z801 Family history of malignant neoplasm of trachea, bronchus and lung: Secondary | ICD-10-CM | POA: Insufficient documentation

## 2016-11-17 DIAGNOSIS — G588 Other specified mononeuropathies: Secondary | ICD-10-CM

## 2016-11-17 DIAGNOSIS — K219 Gastro-esophageal reflux disease without esophagitis: Secondary | ICD-10-CM | POA: Diagnosis not present

## 2016-11-17 DIAGNOSIS — Z808 Family history of malignant neoplasm of other organs or systems: Secondary | ICD-10-CM | POA: Diagnosis not present

## 2016-11-17 DIAGNOSIS — I2510 Atherosclerotic heart disease of native coronary artery without angina pectoris: Secondary | ICD-10-CM | POA: Insufficient documentation

## 2016-11-17 DIAGNOSIS — G8912 Acute post-thoracotomy pain: Secondary | ICD-10-CM | POA: Diagnosis not present

## 2016-11-17 DIAGNOSIS — Z800 Family history of malignant neoplasm of digestive organs: Secondary | ICD-10-CM | POA: Diagnosis not present

## 2016-11-17 DIAGNOSIS — R0602 Shortness of breath: Secondary | ICD-10-CM | POA: Insufficient documentation

## 2016-11-17 DIAGNOSIS — Z902 Acquired absence of lung [part of]: Secondary | ICD-10-CM | POA: Insufficient documentation

## 2016-11-17 DIAGNOSIS — Z9889 Other specified postprocedural states: Secondary | ICD-10-CM | POA: Insufficient documentation

## 2016-11-17 NOTE — Patient Instructions (Signed)
GENERAL RISKS AND COMPLICATIONS  What are the risk, side effects and possible complications? Generally speaking, most procedures are safe.  However, with any procedure there are risks, side effects, and the possibility of complications.  The risks and complications are dependent upon the sites that are lesioned, or the type of nerve block to be performed.  The closer the procedure is to the spine, the more serious the risks are.  Great care is taken when placing the radio frequency needles, block needles or lesioning probes, but sometimes complications can occur. 1. Infection: Any time there is an injection through the skin, there is a risk of infection.  This is why sterile conditions are used for these blocks.  There are four possible types of infection. 1. Localized skin infection. 2. Central Nervous System Infection-This can be in the form of Meningitis, which can be deadly. 3. Epidural Infections-This can be in the form of an epidural abscess, which can cause pressure inside of the spine, causing compression of the spinal cord with subsequent paralysis. This would require an emergency surgery to decompress, and there are no guarantees that the patient would recover from the paralysis. 4. Discitis-This is an infection of the intervertebral discs.  It occurs in about 1% of discography procedures.  It is difficult to treat and it may lead to surgery.        2. Pain: the needles have to go through skin and soft tissues, will cause soreness.       3. Damage to internal structures:  The nerves to be lesioned may be near blood vessels or    other nerves which can be potentially damaged.       4. Bleeding: Bleeding is more common if the patient is taking blood thinners such as  aspirin, Coumadin, Ticiid, Plavix, etc., or if he/she have some genetic predisposition  such as hemophilia. Bleeding into the spinal canal can cause compression of the spinal  cord with subsequent paralysis.  This would require an  emergency surgery to  decompress and there are no guarantees that the patient would recover from the  paralysis.       5. Pneumothorax:  Puncturing of a lung is a possibility, every time a needle is introduced in  the area of the chest or upper back.  Pneumothorax refers to free air around the  collapsed lung(s), inside of the thoracic cavity (chest cavity).  Another two possible  complications related to a similar event would include: Hemothorax and Chylothorax.   These are variations of the Pneumothorax, where instead of air around the collapsed  lung(s), you may have blood or chyle, respectively.       6. Spinal headaches: They may occur with any procedures in the area of the spine.       7. Persistent CSF (Cerebro-Spinal Fluid) leakage: This is a rare problem, but may occur  with prolonged intrathecal or epidural catheters either due to the formation of a fistulous  track or a dural tear.       8. Nerve damage: By working so close to the spinal cord, there is always a possibility of  nerve damage, which could be as serious as a permanent spinal cord injury with  paralysis.       9. Death:  Although rare, severe deadly allergic reactions known as "Anaphylactic  reaction" can occur to any of the medications used.      10. Worsening of the symptoms:  We can always make thing worse.    What are the chances of something like this happening? Chances of any of this occuring are extremely low.  By statistics, you have more of a chance of getting killed in a motor vehicle accident: while driving to the hospital than any of the above occurring .  Nevertheless, you should be aware that they are possibilities.  In general, it is similar to taking a shower.  Everybody knows that you can slip, hit your head and get killed.  Does that mean that you should not shower again?  Nevertheless always keep in mind that statistics do not mean anything if you happen to be on the wrong side of them.  Even if a procedure has a 1  (one) in a 1,000,000 (million) chance of going wrong, it you happen to be that one..Also, keep in mind that by statistics, you have more of a chance of having something go wrong when taking medications.  Who should not have this procedure? If you are on a blood thinning medication (e.g. Coumadin, Plavix, see list of "Blood Thinners"), or if you have an active infection going on, you should not have the procedure.  If you are taking any blood thinners, please inform your physician.  How should I prepare for this procedure?  Do not eat or drink anything at least six hours prior to the procedure.  Bring a driver with you .  It cannot be a taxi.  Come accompanied by an adult that can drive you back, and that is strong enough to help you if your legs get weak or numb from the local anesthetic.  Take all of your medicines the morning of the procedure with just enough water to swallow them.  If you have diabetes, make sure that you are scheduled to have your procedure done first thing in the morning, whenever possible.  If you have diabetes, take only half of your insulin dose and notify our nurse that you have done so as soon as you arrive at the clinic.  If you are diabetic, but only take blood sugar pills (oral hypoglycemic), then do not take them on the morning of your procedure.  You may take them after you have had the procedure.  Do not take aspirin or any aspirin-containing medications, at least eleven (11) days prior to the procedure.  They may prolong bleeding.  Wear loose fitting clothing that may be easy to take off and that you would not mind if it got stained with Betadine or blood.  Do not wear any jewelry or perfume  Remove any nail coloring.  It will interfere with some of our monitoring equipment.  NOTE: Remember that this is not meant to be interpreted as a complete list of all possible complications.  Unforeseen problems may occur.  BLOOD THINNERS The following drugs  contain aspirin or other products, which can cause increased bleeding during surgery and should not be taken for 2 weeks prior to and 1 week after surgery.  If you should need take something for relief of minor pain, you may take acetaminophen which is found in Tylenol,m Datril, Anacin-3 and Panadol. It is not blood thinner. The products listed below are.  Do not take any of the products listed below in addition to any listed on your instruction sheet.  A.P.C or A.P.C with Codeine Codeine Phosphate Capsules #3 Ibuprofen Ridaura  ABC compound Congesprin Imuran rimadil  Advil Cope Indocin Robaxisal  Alka-Seltzer Effervescent Pain Reliever and Antacid Coricidin or Coricidin-D  Indomethacin Rufen    Alka-Seltzer plus Cold Medicine Cosprin Ketoprofen S-A-C Tablets  Anacin Analgesic Tablets or Capsules Coumadin Korlgesic Salflex  Anacin Extra Strength Analgesic tablets or capsules CP-2 Tablets Lanoril Salicylate  Anaprox Cuprimine Capsules Levenox Salocol  Anexsia-D Dalteparin Magan Salsalate  Anodynos Darvon compound Magnesium Salicylate Sine-off  Ansaid Dasin Capsules Magsal Sodium Salicylate  Anturane Depen Capsules Marnal Soma  APF Arthritis pain formula Dewitt's Pills Measurin Stanback  Argesic Dia-Gesic Meclofenamic Sulfinpyrazone  Arthritis Bayer Timed Release Aspirin Diclofenac Meclomen Sulindac  Arthritis pain formula Anacin Dicumarol Medipren Supac  Analgesic (Safety coated) Arthralgen Diffunasal Mefanamic Suprofen  Arthritis Strength Bufferin Dihydrocodeine Mepro Compound Suprol  Arthropan liquid Dopirydamole Methcarbomol with Aspirin Synalgos  ASA tablets/Enseals Disalcid Micrainin Tagament  Ascriptin Doan's Midol Talwin  Ascriptin A/D Dolene Mobidin Tanderil  Ascriptin Extra Strength Dolobid Moblgesic Ticlid  Ascriptin with Codeine Doloprin or Doloprin with Codeine Momentum Tolectin  Asperbuf Duoprin Mono-gesic Trendar  Aspergum Duradyne Motrin or Motrin IB Triminicin  Aspirin  plain, buffered or enteric coated Durasal Myochrisine Trigesic  Aspirin Suppositories Easprin Nalfon Trillsate  Aspirin with Codeine Ecotrin Regular or Extra Strength Naprosyn Uracel  Atromid-S Efficin Naproxen Ursinus  Auranofin Capsules Elmiron Neocylate Vanquish  Axotal Emagrin Norgesic Verin  Azathioprine Empirin or Empirin with Codeine Normiflo Vitamin E  Azolid Emprazil Nuprin Voltaren  Bayer Aspirin plain, buffered or children's or timed BC Tablets or powders Encaprin Orgaran Warfarin Sodium  Buff-a-Comp Enoxaparin Orudis Zorpin  Buff-a-Comp with Codeine Equegesic Os-Cal-Gesic   Buffaprin Excedrin plain, buffered or Extra Strength Oxalid   Bufferin Arthritis Strength Feldene Oxphenbutazone   Bufferin plain or Extra Strength Feldene Capsules Oxycodone with Aspirin   Bufferin with Codeine Fenoprofen Fenoprofen Pabalate or Pabalate-SF   Buffets II Flogesic Panagesic   Buffinol plain or Extra Strength Florinal or Florinal with Codeine Panwarfarin   Buf-Tabs Flurbiprofen Penicillamine   Butalbital Compound Four-way cold tablets Penicillin   Butazolidin Fragmin Pepto-Bismol   Carbenicillin Geminisyn Percodan   Carna Arthritis Reliever Geopen Persantine   Carprofen Gold's salt Persistin   Chloramphenicol Goody's Phenylbutazone   Chloromycetin Haltrain Piroxlcam   Clmetidine heparin Plaquenil   Cllnoril Hyco-pap Ponstel   Clofibrate Hydroxy chloroquine Propoxyphen         Before stopping any of these medications, be sure to consult the physician who ordered them.  Some, such as Coumadin (Warfarin) are ordered to prevent or treat serious conditions such as "deep thrombosis", "pumonary embolisms", and other heart problems.  The amount of time that you may need off of the medication may also vary with the medication and the reason for which you were taking it.  If you are taking any of these medications, please make sure you notify your pain physician before you undergo any  procedures.         Intercostal Nerve Block Patient Information  Description: The twelve intercostal nerves arise from the first thru twelfth thoracic nerve roots.  The nerve begins at the spine and wraps around the body, lying in a groove underneath each rib.  Each intercostal nerve innervates a specific strip of skin and body walk of the abdomen and chest.  Therefore, injuries of the chest wall or abdominal wall result in pain that is transmitted back to the brian via the intercostal nerves.  Examples of such injuries include rib fractures and incisions for lung and gall bladder surgery.  Occasionally, pain may persist long after an injury or surgical incision secondary to inflammation and irritation of the intercostal nerve.  The longstanding pain is known  as intercostal neuralgia.  An intercostal nerve block is preformed to eliminate pain either temporarily or permanently.  A small needle is placed below the rib and local anesthetic (like Novocaine) and possibly steroid is injected.  Usually 2-4 intercostal nerves are blocked at a time depending on the problem.  The patient will experience a slight "pin-prick" sensation for each injection.  Shortly thereafter, the strip of skin that is innervated by the blocked intercostal nerve will feel numb.  Persistent pain that is only temporarily relieved with local anesthetic may require a more permanent block. This procedure is called Cryoneurolysis and entails placing a small probe beneath the rib to freeze the nerve.  Conditions that may be treated by intercostal nerve blocks:   Rib fractures  Longstanding pain from surgery of the chest or abdomen (intercostal neuralgia)  Pain from chest tubes  Pain from trauma to the chest  Preparation for the injections:  1. Do not eat any solid food or dairy products within 8 hours of your appointment. 2. You may drink clear liquids up to 3 hours before appointment.  Clear liquids include water, black  coffee, juice or soda.  No milk or cream please. 3. You may take your regular medication, including pain medications, with a sip of water before your appointment.  Diabetics should hold regular insulin (if take separately) and take 1/2 normal NPH dose the morning of the procedure.   Carry some sugar containing items with you to your appointment. 4. A driver must accompany you and be prepared to drive you home after your procedure. 5. Bring all your current medications with you. 6. An IV may be inserted and sedation may be given at the discretion of the physician. 7. A blood pressure cuff, EKG and other monitors will often be applied during the procedure.  Some patients may need to have extra oxygen administered for a short period. 8. You will be asked to provide medical information, including your allergies, prior to the procedure.  We must know immediately if you are taking blood thinners (like Coumadin/Warfarin) or if you are allergic to IV iodine contrast (dye). We must know if you could possible be pregnant.  Possible side-effects:   Bleeding from needle site  Infection (rare)  Nerve injury (rare)  Numbness & tingling of skin  Collapsed lung requiring chest tube (rare)  Local anesthetic toxicity (rare)  Light-headedness (temporary)  Pain at injection site (several days)  Decreased blood pressure (temporary)  Shortness of breath  Jittery/shaking sensation (temporary)  Call if you experience:   Difficulty breathing or hives (go directly to the emergency room)  Redness, inflammation or drainage at the injection site  Severe pain at the site of the injection  Any new symptoms which are concerning   Please note:  Your pain may subside immediately but may return several hours after the injection.  Often, more than one injection is required to reduce the pain. Also, if several temporary blocks with local anesthetic are ineffective, a more permanent block with cryolysis may  be necessary.  This will be discussed with you should this be the case.  If you have any questions, please call [phone] [facility]

## 2016-11-17 NOTE — Progress Notes (Signed)
Subjective:  Patient ID: Sheila Suarez, female    DOB: 01/01/1956  Age: 61 y.o. MRN: [id number]  CC: Abdominal Pain (post surgical pain at the site of incision. ) and Shoulder Pain  Service Provided on Last Visit: Evaluation (new patient visit. ) PROCEDURE:None  HPI [name] presents for Reevaluation. She continues to have pain in the left thoracic area in the vicinity of her scar from her thoracotomy. She has tenderness to light touch and her broad irritates this area she has pain that radiates into the left breast and the pain is stable as compared to her previous description at her new patient evaluation a few months ago. She has used oxycodone or Vicodin that has helped at night. The pain is mainly at night with associated muscle spasming and is of a moderate intensity. She has tried Neurontin but it makes her feel funny and this was discontinued otherwise she just tries to deal with the pain. Anti-inflammatories do not seem to help significantly and she does use Mobic physical therapy modalities have been ineffective. Otherwise she is in her usual state of health.    new patient evaluation. She is a pleasant 61 year old female that has had a recent thoracotomy for a lobe resection with Dr. Faith Rogue. She is reporting some persistent pain in a dermatomal distribution left side in the mid axillary line and below the posterior left scapula. The pain quality is cramping with associated itching and nagging continuous symptoms. Occasionally the pain as stabbing but is generally uncomfortable with a pain score of 5 out of 10. It does not appear to be influenced by time of day and has persisted since her surgery in September 2017. No associated shortness of breath or chest pain or difficulty with inspiration or expiration are noted. She has some cramping pain at night and despite medication management the pain persists. She reports having had a thorough postoperative workup with Dr. Faith Rogue which  was otherwise unremarkable. Postoperative associated problems.  History [place] has a past medical history of Cancer ([place]); Coronary artery disease; Emphysema of lung ([place]); GERD (gastroesophageal reflux disease); Lupus; and Shortness of breath dyspnea.   She has a past surgical history that includes Tubal ligation (Bilateral); Cyst excision (Left); Flexible bronchoscopy (N/A, 04/17/2016); Thoracotomy (Left, 04/17/2016); Colonoscopy with propofol (N/A, 06/02/2016); Esophagogastroduodenoscopy (egd) with propofol (N/A, 06/02/2016); Lung removal, partial (Left); left upper lobectomy (Left, 03/2016); and Hand surgery (Left).   Her family history includes Cancer in her maternal aunt, maternal aunt, and maternal uncle; Cancer (age of onset: 57) in her daughter; Heart disease in her father; Lung cancer in her brother and sister; Pancreatic cancer in her sister; Stomach cancer in her paternal grandmother.She reports that she quit smoking about 7 months ago. Her smoking use included Cigarettes. She has a 8.75 pack-year smoking history. She quit smokeless tobacco use about 8 months ago. She reports that she drinks about 0.6 oz of alcohol per week . She reports that she does not use drugs.  No results found for this or any previous visit.  No results found for: TOXASSSELUR  Outpatient Medications Prior to Visit  Medication Sig Dispense Refill  . albuterol (PROVENTIL HFA;VENTOLIN HFA) 108 (90 Base) MCG/ACT inhaler Inhale 2 puffs into the lungs every 6 (six) hours as needed for wheezing or shortness of breath. 1 Inhaler 2  . gabapentin (NEURONTIN) 100 MG capsule Take 1 capsule (100 mg total) by mouth 3 (three) times daily. 90 capsule 0  . linaclotide (LINZESS) 145 MCG CAPS capsule  TAKE 1 CAPSULE (145 MCG TOTAL) BY MOUTH DAILY BEFORE BREAKFAST.    . meloxicam (MOBIC) 7.5 MG tablet Take by mouth.    . ondansetron (ZOFRAN) 4 MG tablet Take 4 mg by mouth as needed.     . ranitidine (ZANTAC) 150 MG tablet Take 150 mg  by mouth daily.     Marland Kitchen umeclidinium-vilanterol (ANORO ELLIPTA) 62.5-25 MCG/INH AEPB Inhale 1 puff into the lungs daily. 60 each 4  . omeprazole (PRILOSEC) 40 MG capsule Take 1 capsule (40 mg total) by mouth daily. 30 capsule 3  . clarithromycin (BIAXIN) 500 MG tablet TAKE 1 TABLET (500 MG TOTAL) BY MOUTH 2 (TWO) TIMES DAILY.    Marland Kitchen linaclotide (LINZESS) 72 MCG capsule Take 1 capsule (72 mcg total) by mouth daily before breakfast. (Patient not taking: Reported on 11/17/2016) 30 capsule 0  . oxyCODONE-acetaminophen (PERCOCET/ROXICET) 5-325 MG tablet Take by mouth.    . oxyCODONE-acetaminophen (ROXICET) 5-325 MG tablet Take 1 tablet by mouth every 6 (six) hours as needed for severe pain. (Patient not taking: Reported on 11/17/2016) 20 tablet 0  . sucralfate (CARAFATE) 1 g tablet Take 1 g by mouth 4 (four) times daily -  with meals and at bedtime.      No facility-administered medications prior to visit.    Lab Results  Component Value Date   WBC 5.7 09/18/2016   HGB 11.8 (L) 09/18/2016   HCT 35.7 09/18/2016   PLT 406 09/18/2016   GLUCOSE 60 (L) 09/18/2016   ALT 11 (L) 09/18/2016   AST 15 09/18/2016   NA 140 09/18/2016   K 4.1 09/18/2016   CL 107 09/18/2016   CREATININE 0.68 09/18/2016   BUN 8 09/18/2016   CO2 27 09/18/2016   TSH 0.635 02/09/2016   INR 0.91 03/28/2016    --------------------------------------------------------------------------------------------------------------------- Ct Angio Neck W Or Wo Contrast  Result Date: 06/06/2016 CLINICAL DATA:  Initial evaluation for acute onset severe anterior neck pain. EXAM: CT ANGIOGRAPHY NECK TECHNIQUE: Multidetector CT imaging of the neck was performed using the standard protocol during bolus administration of intravenous contrast. Multiplanar CT image reconstructions and MIPs were obtained to evaluate the vascular anatomy. Carotid stenosis measurements (when applicable) are obtained utilizing NASCET criteria, using the distal internal  carotid diameter as the denominator. CONTRAST:  100 cc of Isovue 370. COMPARISON:  None available. FINDINGS: Aortic arch: Examination is somewhat technically limited due to timing of the contrast bolus. Aortic arch of normal caliber with normal 3 vessel morphology. Scattered plaque about the origin of the great vessels without high-grade stenosis. Visualized subclavian arteries are widely patent. Right carotid system: Right common carotid artery patent from its origin to the bifurcation without stenosis or acute abnormality. No significant atheromatous disease about the right carotid bifurcation. Right ICA patent from the bifurcation to the skullbase without dissection, stenosis or occlusion. Right external carotid artery and its branches within normal limits. Left carotid system: Left common carotid artery patent from its origin to the bifurcation without stenosis or definite acute abnormality. Evaluation mildly limited by streak artifact from prominent contrast within the adjacent left internal jugular vein. Mild a centric plaque about the left bifurcation without significant stenosis. Left ICA patent distally to the skullbase without stenosis, dissection, or occlusion. Left external carotid artery and its branches within normal limits. Vertebral arteries: Both of the vertebral arteries arise from the subclavian arteries. Left vertebral artery not well evaluated proximally due to adjacent venous contrast. Proximal pre foraminal right V1 segment within normal limits. Vertebral arteries  patent distally without stenosis, dissection, or occlusion. Distal left P2/P3 segments not well evaluated due to adjacent venous contrast as well. Visualized basilar artery widely patent. Skeleton: No acute osseous abnormality. No worrisome lytic or blastic osseous lesions. Other neck: Visualized soft tissues of the neck demonstrate no acute abnormality. Please note evaluation somewhat limited by streak artifact from IV contrast  within multiple venous collaterals within the neck. No definite adenopathy. Thyroid grossly unremarkable. Upper chest: Visualized upper chest demonstrates no definite acute abnormality. This is better evaluated on concomitant CT of the chest orbits same time. Emphysema noted. IMPRESSION: 1. Negative CTA of the neck. No acute vascular abnormality identified within the major arterial vasculature of the neck. No high-grade or critical stenosis. 2. Mild atheromatous plaque about the left carotid bifurcation without stenosis. 3. Emphysema. Electronically Signed   By: Jeannine Boga M.D.   On: 06/06/2016 21:42   Dg Chest Portable 1 View  Result Date: 06/06/2016 CLINICAL DATA:  Upper central chest pain since 1600 hours today. Pain is worse with inspiration. EXAM: PORTABLE CHEST 1 VIEW COMPARISON:  05/26/2016 FINDINGS: Volume loss of the left lung status post reported left lower lobectomy with chain sutures noted about the left hilum as before. There is hyperinflation of right lung. The cardiac and mediastinal contours are stable in appearance with atherosclerosis of the aortic arch. The lungs are free of pneumonic consolidations. Pulmonary vasculature is unremarkable. No effusion or pneumothorax. No suspicious osseous abnormalities. IMPRESSION: Stable postoperative appearance of the chest with volume loss on the left. Compensatory hyperinflation of the right. Electronically Signed   By: Ashley Royalty M.D.   On: 06/06/2016 20:28   Ct Angio Chest Aorta W And/or Wo Contrast  Result Date: 06/06/2016 CLINICAL DATA:  Neck pain and history of prior left upper lobectomy for carcinoma EXAM: CT ANGIOGRAPHY CHEST WITH CONTRAST TECHNIQUE: Multidetector CT imaging of the chest was performed using the standard protocol during bolus administration of intravenous contrast. Multiplanar CT image reconstructions and MIPs were obtained to evaluate the vascular anatomy. CONTRAST:  100 mL Isovue 370. COMPARISON:  02/07/2016  FINDINGS: Cardiovascular: Thoracic aorta is within normal limits. No evidence of aneurysmal dilatation or dissection is noted. The pulmonary artery shows a normal branching pattern without evidence of filling defect. Mild coronary calcifications are seen. Mediastinum/Nodes: The thoracic inlet is within normal limits. Considerable collateralization of venous flow is noted related to a focal area of narrowing within the left innominate vein best seen on image number 25 of series 6. No significant hilar or mediastinal adenopathy is identified. Lungs/Pleura: Postsurgical changes are noted consistent with the left upper lobectomy. The right lung is well aerated without focal infiltrate or sizable effusion. Very mild emphysematous changes are seen. The residual left lung is within normal limits. No evidence of recurrence is seen. Upper Abdomen: Hypodensity is again noted within the right lobe of the liver stable from prior exam dated 02/07/2016 consistent with hemangioma. The remainder the upper abdomen is within normal limits. Musculoskeletal: No acute bony abnormality is noted. Review of the MIP images confirms the above findings. IMPRESSION: Status post left upper lobectomy. 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 No evidence of pulmonary emboli.  No aortic dissection is seen. Stable hepatic hemangioma. Electronically Signed   By: Inez Catalina M.D.   On: 06/06/2016 21:27       ---------------------------------------------------------------------------------------------------------------------- Past Medical History:  Diagnosis Date  . Cancer ([place])   . Coronary  artery disease   . Emphysema of lung ([place])   . GERD (gastroesophageal reflux disease)   . Lupus   . Shortness of breath dyspnea     Past Surgical History:  Procedure Laterality Date  . COLONOSCOPY WITH PROPOFOL N/A 06/02/2016   Procedure: COLONOSCOPY WITH PROPOFOL;   Surgeon: [name], MD;  Location: [facility] ENDOSCOPY;  Service: Gastroenterology;  Laterality: N/A;  . CYST EXCISION Left    hand  . ESOPHAGOGASTRODUODENOSCOPY (EGD) WITH PROPOFOL N/A 06/02/2016   Procedure: ESOPHAGOGASTRODUODENOSCOPY (EGD) WITH PROPOFOL;  Surgeon: [name], MD;  Location: [facility] ENDOSCOPY;  Service: Gastroenterology;  Laterality: N/A;  . FLEXIBLE BRONCHOSCOPY N/A 04/17/2016   Procedure: FLEXIBLE BRONCHOSCOPY;  Surgeon: [name], MD;  Location: [facility] ORS;  Service: Thoracic;  Laterality: N/A;  . HAND SURGERY Left   . left upper lobectomy Left 03/2016  . LUNG REMOVAL, PARTIAL Left    Left lower lobe  . THORACOTOMY Left 04/17/2016   Procedure: THORACOTOMY LEFT UPPER LOBE LUNG RESECTION;  Surgeon: [name], MD;  Location: [facility] ORS;  Service: Thoracic;  Laterality: Left;  . TUBAL LIGATION Bilateral    37 years ago    Family History  Problem Relation Age of Onset  . Pancreatic cancer Sister   . Lung cancer Sister   . Lung cancer Brother   . Stomach cancer Paternal Grandmother   . Heart disease Father   . Cancer Daughter 52    cervical  . Cancer Maternal Aunt     brain  . Cancer Maternal Uncle     prostate  . Cancer Maternal Aunt     bone    Social History  Substance Use Topics  . Smoking status: Former Smoker    Packs/day: 0.25    Years: 35.00    Types: Cigarettes    Quit date: 03/19/2016  . Smokeless tobacco: Former Systems developer    Quit date: 03/16/2016  . Alcohol use 0.6 oz/week    1 Cans of beer per week     Comment: Rarely    ---------------------------------------------------------------------------------------------------------------------  Scheduled Meds: Continuous Infusions: PRN Meds:.   BP 139/65 (BP Location: Left Arm, Patient Position: Sitting, Cuff Size: Normal)   Pulse (!) 58   Temp 97.6 F (36.4 C) (Oral)   Resp 16   Ht '5\' 5"'$  (1.651 m)   Wt 121 lb (54.9 kg)   SpO2 100%   BMI 20.14 kg/m    BP Readings from Last 3 Encounters:   11/17/16 139/65  10/16/16 122/64  09/18/16 139/72     Wt Readings from Last 3 Encounters:  11/17/16 121 lb (54.9 kg)  10/16/16 121 lb 6.4 oz (55.1 kg)  09/18/16 120 lb 13 oz (54.8 kg)     ----------------------------------------------------------------------------------------------------------------------  ROS Review of Systems  Cardiac: No chest pain Pulmonary: No shortness of breath or dyspnea Neurologic: Negative Psychologic: No history of drug abuse or suicidal ideation or anxiety  Objective:  BP 139/65 (BP Location: Left Arm, Patient Position: Sitting, Cuff Size: Normal)   Pulse (!) 58   Temp 97.6 F (36.4 C) (Oral)   Resp 16   Ht '5\' 5"'$  (1.651 m)   Wt 121 lb (54.9 kg)   SpO2 100%   BMI 20.14 kg/m   Physical Exam Patient is alert oriented cooperative compliant Pupils are equally round reactive to light with extraocular muscles intact Lungs are clear to auscultation with distant breath sounds Heart is regular rate and rhythm Inspection of the left mid axillary line thoracic region reveals a  well-healed scar approximately She does have hyperalgesia overlying the scar in the mid axillary line left side. This does radiate into the left breast. The scar looks well-healed with no erythema and edema or evidence of infection. There are no skin color changes     Assessment & Plan:   [place] was seen today for abdominal pain and shoulder pain.  Diagnoses and all orders for this visit:  Acute postthoracotomy pain -     INTERCOSTAL NERVE BLOCK; Future  Intercostal neuralgia -     INTERCOSTAL NERVE BLOCK; Future     ----------------------------------------------------------------------------------------------------------------------  Problem List Items Addressed This Visit    None    Visit Diagnoses    Acute postthoracotomy pain    -  Primary   Relevant Orders   INTERCOSTAL NERVE BLOCK   Intercostal neuralgia       Relevant Orders   INTERCOSTAL NERVE  BLOCK      ----------------------------------------------------------------------------------------------------------------------  1. Acute postthoracotomy pain IHad a long discussion regarding intercostal nerve blocks and the nature of the procedure and I feel that she would like to pursue this. We'll schedule this for within the next few weeks. I talked to her about the risks and benefits including pneumothorax and intravascular injection. She is well-informed and desires to proceed with the injection and a hopeful that she will derive some significant benefit from it as she does seem to be quite miserable at times. If she fails this she may be a candidate for opioid at bedtime as this seems to be when her pain is most considerable period and fortunately at it is now of a chronic nature.  2. Chronic left-sided thoracic back pain Continue with physical therapy. We have also talked about medication management in addition to serotonin uptake inhibitors, tricyclic antidepressants, Lidoderm patch but she is interested in none of these modalities.  ----------------------------------------------------------------------------------------------------------------------  I have discontinued Ms. Towner's clarithromycin, oxyCODONE-acetaminophen, sucralfate, and oxyCODONE-acetaminophen. I am also having her maintain her gabapentin, linaclotide, meloxicam, ondansetron, ranitidine, omeprazole, albuterol, umeclidinium-vilanterol, and traMADol.   Meds ordered this encounter  Medications  . traMADol (ULTRAM) 50 MG tablet    Sig: Take 50 mg by mouth every 6 (six) hours as needed for moderate pain.       Follow-up: Return in about 2 weeks (around 12/01/2016) for procedure.    Molli Barrows, MD 9:51 AM  The [place] practitioner database for opioid medications on this patient has been reviewed by me and my staff   Greater than 50% of the total encounter time was spent in counseling and / or coordination of  care.     This dictation was performed utilizing Systems analyst.  Please excuse any unintentional or mistaken typographical errors as a result.

## 2016-11-17 NOTE — Progress Notes (Signed)
Safety precautions to be maintained throughout the outpatient stay will include: orient to surroundings, keep bed in low position, maintain call bell within reach at all times, provide assistance with transfer out of bed and ambulation.  

## 2016-11-19 ENCOUNTER — Emergency Department
Admission: EM | Admit: 2016-11-19 | Discharge: 2016-11-19 | Disposition: A | Discharge status: Home / Self Care | Payer: BLUE CROSS/BLUE SHIELD | Attending: Emergency Medicine | Admitting: Emergency Medicine

## 2016-11-19 ENCOUNTER — Emergency Department: Payer: BLUE CROSS/BLUE SHIELD

## 2016-11-19 ENCOUNTER — Encounter: Payer: Self-pay | Admitting: Radiology

## 2016-11-19 DIAGNOSIS — R0602 Shortness of breath: Secondary | ICD-10-CM | POA: Insufficient documentation

## 2016-11-19 DIAGNOSIS — I2510 Atherosclerotic heart disease of native coronary artery without angina pectoris: Secondary | ICD-10-CM | POA: Diagnosis not present

## 2016-11-19 DIAGNOSIS — Z87891 Personal history of nicotine dependence: Secondary | ICD-10-CM | POA: Insufficient documentation

## 2016-11-19 DIAGNOSIS — Z79899 Other long term (current) drug therapy: Secondary | ICD-10-CM | POA: Diagnosis not present

## 2016-11-19 DIAGNOSIS — R079 Chest pain, unspecified: Secondary | ICD-10-CM | POA: Diagnosis not present

## 2016-11-19 LAB — COMPREHENSIVE METABOLIC PANEL
ALT: 11 U/L — ABNORMAL LOW (ref 14–54)
ANION GAP: 7 (ref 5–15)
AST: 16 U/L (ref 15–41)
Albumin: 4 g/dL (ref 3.5–5.0)
Alkaline Phosphatase: 75 U/L (ref 38–126)
BUN: 9 mg/dL (ref 6–20)
CHLORIDE: 105 mmol/L (ref 101–111)
CO2: 25 mmol/L (ref 22–32)
CREATININE: 0.69 mg/dL (ref 0.44–1.00)
Calcium: 9.4 mg/dL (ref 8.9–10.3)
Glucose, Bld: 82 mg/dL (ref 65–99)
Potassium: 3.7 mmol/L (ref 3.5–5.1)
SODIUM: 137 mmol/L (ref 135–145)
Total Bilirubin: 0.9 mg/dL (ref 0.3–1.2)
Total Protein: 7.2 g/dL (ref 6.5–8.1)

## 2016-11-19 LAB — CBC WITH DIFFERENTIAL/PLATELET
Basophils Absolute: 0 10*3/uL (ref 0–0.1)
Basophils Relative: 0 %
EOS ABS: 0 10*3/uL (ref 0–0.7)
EOS PCT: 0 %
HCT: 39 % (ref 35.0–47.0)
Hemoglobin: 12.8 g/dL (ref 12.0–16.0)
LYMPHS ABS: 2.5 10*3/uL (ref 1.0–3.6)
LYMPHS PCT: 28 %
MCH: 29.3 pg (ref 26.0–34.0)
MCHC: 32.7 g/dL (ref 32.0–36.0)
MCV: 89.6 fL (ref 80.0–100.0)
Monocytes Absolute: 0.7 10*3/uL (ref 0.2–0.9)
Monocytes Relative: 7 %
Neutro Abs: 5.8 10*3/uL (ref 1.4–6.5)
Neutrophils Relative %: 65 %
PLATELETS: 420 10*3/uL (ref 150–440)
RBC: 4.36 MIL/uL (ref 3.80–5.20)
RDW: 13.1 % (ref 11.5–14.5)
WBC: 8.9 10*3/uL (ref 3.6–11.0)

## 2016-11-19 LAB — TROPONIN I: Troponin I: <0.03 ng/mL (ref <0.03)

## 2016-11-19 MED ORDER — LORAZEPAM 2 MG/ML IJ SOLN
0.5000 mg | Freq: Once | INTRAMUSCULAR | Status: AC
  Administered 2016-11-19: 0.5 mg via INTRAVENOUS
  Filled 2016-11-19: qty 1

## 2016-11-19 MED ORDER — FENTANYL CITRATE (PF) 100 MCG/2ML IJ SOLN
75.0000 ug | Freq: Once | INTRAMUSCULAR | Status: AC
  Administered 2016-11-19: 75 ug via INTRAVENOUS

## 2016-11-19 MED ORDER — ALPRAZOLAM 0.25 MG PO TABS: 0.2500 mg | ORAL_TABLET | Freq: Three times a day (TID) | ORAL | 0 refills | Status: DC | PRN

## 2016-11-19 MED ORDER — FENTANYL CITRATE (PF) 100 MCG/2ML IJ SOLN
INTRAMUSCULAR | Status: AC
  Administered 2016-11-19: 75 ug via INTRAVENOUS
  Filled 2016-11-19: qty 2

## 2016-11-19 MED ORDER — GI COCKTAIL ~~LOC~~
30.0000 mL | Freq: Once | ORAL | Status: AC
  Administered 2016-11-19: 30 mL via ORAL
  Filled 2016-11-19: qty 30

## 2016-11-19 MED ORDER — IOPAMIDOL (ISOVUE-370) INJECTION 76%
75.0000 mL | Freq: Once | INTRAVENOUS | Status: AC | PRN
  Administered 2016-11-19: 75 mL via INTRAVENOUS

## 2016-11-19 NOTE — ED Triage Notes (Signed)
Pt c/o chest pain and SOB since yesterday. Hx lung cancer, lobectomy, and collapsed lung per pt report. Chest pain 9/10.

## 2016-11-19 NOTE — Discharge Instructions (Signed)
Please seek medical attention for any high fevers, chest pain, shortness of breath, change in behavior, persistent vomiting, bloody stool or any other new or concerning symptoms.  

## 2016-11-19 NOTE — ED Provider Notes (Signed)
[facility] Emergency Department Provider Note   ____________________________________________   I have reviewed the triage vital signs and the nursing notes.   HISTORY  Chief Complaint Chest Pain and Shortness of Breath   History limited by: Not Limited   HPI Sheila Suarez is a 61 y.o. female who presents to the emergency department today because of concerns for chest pain. It is located in the upper middle chest. It started yesterday. It has been constant. It is severe. It is worse with deep breaths. She has had some associated cough. She denies any bloody cough. She denies any fevers.   Past Medical History:  Diagnosis Date  . Cancer ([place])   . Coronary artery disease   . Emphysema of lung ([place])   . GERD (gastroesophageal reflux disease)   . Lupus   . Shortness of breath dyspnea     Patient Active Problem List   Diagnosis Date Noted  . Rib pain 07/26/2016  . Irritable bowel syndrome with constipation 06/27/2016  . H. pylori infection 06/27/2016  . Abdominal pain, left upper quadrant   . Special screening for malignant neoplasms, colon   . Pneumothorax 04/28/2016  . Lung mass 04/17/2016  . CAD (coronary artery disease) 02/28/2016  . Shortness of breath 02/28/2016  . Lupus anticoagulant positive 02/21/2016  . Weight loss 02/09/2016    Past Surgical History:  Procedure Laterality Date  . COLONOSCOPY WITH PROPOFOL N/A 06/02/2016   Procedure: COLONOSCOPY WITH PROPOFOL;  Surgeon: [name], MD;  Location: [facility] ENDOSCOPY;  Service: Gastroenterology;  Laterality: N/A;  . CYST EXCISION Left    hand  . ESOPHAGOGASTRODUODENOSCOPY (EGD) WITH PROPOFOL N/A 06/02/2016   Procedure: ESOPHAGOGASTRODUODENOSCOPY (EGD) WITH PROPOFOL;  Surgeon: [name], MD;  Location: [facility] ENDOSCOPY;  Service: Gastroenterology;  Laterality: N/A;  . FLEXIBLE BRONCHOSCOPY N/A 04/17/2016   Procedure: FLEXIBLE BRONCHOSCOPY;  Surgeon: [name], MD;  Location: [facility] ORS;   Service: Thoracic;  Laterality: N/A;  . HAND SURGERY Left   . left upper lobectomy Left 03/2016  . LUNG REMOVAL, PARTIAL Left    Left lower lobe  . THORACOTOMY Left 04/17/2016   Procedure: THORACOTOMY LEFT UPPER LOBE LUNG RESECTION;  Surgeon: [name], MD;  Location: [facility] ORS;  Service: Thoracic;  Laterality: Left;  . TUBAL LIGATION Bilateral    37 years ago    Prior to Admission medications   Medication Sig Start Date End Date Taking? Authorizing Provider  albuterol (PROVENTIL HFA;VENTOLIN HFA) 108 (90 Base) MCG/ACT inhaler Inhale 2 puffs into the lungs every 6 (six) hours as needed for wheezing or shortness of breath. 10/16/16   Flora Lipps, MD  gabapentin (NEURONTIN) 100 MG capsule Take 1 capsule (100 mg total) by mouth 3 (three) times daily. 07/26/16   [name], MD  linaclotide (LINZESS) 145 MCG CAPS capsule TAKE 1 CAPSULE (145 MCG TOTAL) BY MOUTH DAILY BEFORE BREAKFAST. 06/27/16   Historical Provider, MD  meloxicam (MOBIC) 7.5 MG tablet Take by mouth. 08/28/16   Historical Provider, MD  omeprazole (PRILOSEC) 40 MG capsule Take 1 capsule (40 mg total) by mouth daily. 08/28/16 09/27/16  [name], MD  ondansetron (ZOFRAN) 4 MG tablet Take 4 mg by mouth as needed.  05/10/16   Historical Provider, MD  ranitidine (ZANTAC) 150 MG tablet Take 150 mg by mouth daily.  06/05/16   Historical Provider, MD  traMADol (ULTRAM) 50 MG tablet Take 50 mg by mouth every 6 (six) hours as needed for moderate pain.    Historical Provider, MD  umeclidinium-vilanterol (ANORO ELLIPTA) 62.5-25 MCG/INH AEPB Inhale 1 puff into the lungs daily. 11/15/16   Flora Lipps, MD    Allergies Patient has no known allergies.  Family History  Problem Relation Age of Onset  . Pancreatic cancer Sister   . Lung cancer Sister   . Lung cancer Brother   . Stomach cancer Paternal Grandmother   . Heart disease Father   . Cancer Daughter 58    cervical  . Cancer Maternal Aunt     brain  . Cancer Maternal Uncle      prostate  . Cancer Maternal Aunt     bone    Social History Social History  Substance Use Topics  . Smoking status: Former Smoker    Packs/day: 0.25    Years: 35.00    Types: Cigarettes    Quit date: 03/19/2016  . Smokeless tobacco: Former Systems developer    Quit date: 03/16/2016  . Alcohol use 0.6 oz/week    1 Cans of beer per week     Comment: Rarely    Review of Systems  Constitutional: Negative for fever. Cardiovascular: Positive for chest pain. Respiratory: Positive for shortness of breath. Gastrointestinal: Negative for abdominal pain, vomiting and diarrhea. Neurological: Negative for headaches, focal weakness or numbness.  10-point ROS otherwise negative.  ____________________________________________   PHYSICAL EXAM:  VITAL SIGNS: ED Triage Vitals  Enc Vitals Group     BP -- 140/92     Pulse -- 64     Resp -- 14     Temp --      Temp src --      SpO2 -- 100     Weight 11/19/16 0753 121 lb (54.9 kg)     Height 11/19/16 0753 '5\' 5"'$  (1.651 m)     Head Circumference --      Peak Flow --      Pain Score 11/19/16 0752 9   Constitutional: Alert and oriented. Well appearing and in no distress. Eyes: Conjunctivae are normal. Normal extraocular movements. ENT   Head: Normocephalic and atraumatic.   Nose: No congestion/rhinnorhea.   Mouth/Throat: Mucous membranes are moist.   Neck: No stridor. Hematological/Lymphatic/Immunilogical: No cervical lymphadenopathy. Cardiovascular: Normal rate, regular rhythm.  No murmurs, rubs, or gallops. Respiratory: Normal respiratory effort without tachypnea nor retractions. Breath sounds are clear and equal bilaterally. No wheezes/rales/rhonchi. Gastrointestinal: Soft and non tender. No rebound. No guarding.  Genitourinary: Deferred Musculoskeletal: Normal range of motion in all extremities. No lower extremity edema. Neurologic:  Normal speech and language. No gross focal neurologic deficits are appreciated.  Skin:  Skin is  warm, dry and intact. No rash noted. Psychiatric: Mood and affect are normal. Speech and behavior are normal. Patient exhibits appropriate insight and judgment.  ____________________________________________    LABS (pertinent positives/negatives)  Labs Reviewed  COMPREHENSIVE METABOLIC PANEL - Abnormal; Notable for the following:       Result Value   ALT 11 (*)    All other components within normal limits  TROPONIN I  CBC WITH DIFFERENTIAL/PLATELET     ____________________________________________   EKG  I, Nance Pear, attending physician, personally viewed and interpreted this EKG  EKG Time: 0755 Rate: 70 Rhythm: ectopic atrial rhythm Axis: normal Intervals: qtc 427 QRS: narrow, q waves V1 ST changes: no st elevation Impression: abnormal ekg   ____________________________________________    RADIOLOGY  CXR IMPRESSION:  1. No acute cardiopulmonary disease.  2. Stable changes from a left lobectomy for treatment of lung  carcinoma.  3.  COPD.     CT angio PE IMPRESSION:  1. No evidence of a pulmonary embolus.  2. No acute findings. No evidence of pneumonia or pulmonary edema.  3. Stable changes of emphysema and stable changes from a previous  left upper lobectomy.  4. Stable small pulmonary nodules as detailed.   ____________________________________________   PROCEDURES  Procedures  ____________________________________________   INITIAL IMPRESSION / ASSESSMENT AND PLAN / ED COURSE  Pertinent labs & imaging results that were available during my care of the patient were reviewed by me and considered in my medical decision making (see chart for details).  Patient presented to the emergency department today because of concerns for chest pain. Chest x-ray without any concerning findings. Patient does have a history of cancer and lobectomy. CT angiogram was performed without any findings of PE. Patient was given some Ativan and did feel relief from  that. The patient will be discharged with a small course of Xanax and follow-up with primary care.  ____________________________________________   FINAL CLINICAL IMPRESSION(S) / ED DIAGNOSES  Final diagnoses:  Nonspecific chest pain     Note: This dictation was prepared with Dragon dictation. Any transcriptional errors that result from this process are unintentional     Nance Pear, MD 11/19/16 1241

## 2016-11-20 ENCOUNTER — Other Ambulatory Visit: Payer: Self-pay

## 2016-11-20 ENCOUNTER — Ambulatory Visit (INDEPENDENT_AMBULATORY_CARE_PROVIDER_SITE_OTHER): Payer: BLUE CROSS/BLUE SHIELD | Admitting: Gastroenterology

## 2016-11-20 ENCOUNTER — Encounter: Payer: Self-pay | Admitting: Gastroenterology

## 2016-11-20 VITALS — BP 98/63 | HR 89 | Temp 98.2°F | Resp 16 | Ht 65.0 in | Wt 119.8 lb

## 2016-11-20 DIAGNOSIS — K219 Gastro-esophageal reflux disease without esophagitis: Secondary | ICD-10-CM | POA: Diagnosis not present

## 2016-11-20 DIAGNOSIS — K5900 Constipation, unspecified: Secondary | ICD-10-CM

## 2016-11-20 MED ORDER — SUCRALFATE 1 GM/10ML PO SUSP: 1.0000 g | Freq: Four times a day (QID) | ORAL | 1 refills | Status: DC

## 2016-11-20 MED ORDER — LINACLOTIDE 145 MCG PO CAPS: ORAL_CAPSULE | ORAL | 3 refills | Status: DC

## 2016-11-20 MED ORDER — OMEPRAZOLE 40 MG PO CPDR: 40.0000 mg | DELAYED_RELEASE_CAPSULE | Freq: Every day | ORAL | 3 refills | Status: DC

## 2016-11-20 NOTE — Progress Notes (Signed)
Primary Care Physician: [place]  Primary Gastroenterologist:  Dr. [name]   Chief Complaint  Patient presents with  . Follow-up    H-PYLORI GASTRITIS    HPI: Sheila Suarez is a 61 y.o. female     Sheila Suarez is a 61 y.o. She is here for a follow up visit for abdominal pain felt likely secondary to IBS-C, H pylori gastritis  . She was last seen on 08/28/16  .   Summary of history : Abdominal pain ongoing for many years, upper abdomen, severe constipation and relief after a bowel movement . She has been on narcotics.   She had a normal colonoscopy on 06/02/2016 and an EGD which appeared normal but biopsies taken showed active chronic gastritis with H pylori .triple therapy prescribed on 06/16/16 .completed treatment for  H pylori. She is a S/p left thoracotomy and LUL lobectomy  LFT's normal in 05/2016 . PET scan in 01/2016 showed only some mild hypermetabolism in the stomach and rest of GI tract was negative.  Interval history 05/2016-07/2016  She was seen at the ER yesterday for chest pain , cough . CT angio of of the chest was normal. "the pink stuff in the ER " helped .   On linzess 145 mcg which helps her .    We ordered a repeat stool H pylori antigen which she has not obtained so far.   Gained 2 lbs since last visit.   She is not taking her omeprazole regularly- she takes it now and then. Advised her to take it daily.  .  Current Outpatient Prescriptions  Medication Sig Dispense Refill  . albuterol (PROVENTIL HFA;VENTOLIN HFA) 108 (90 Base) MCG/ACT inhaler Inhale 2 puffs into the lungs every 6 (six) hours as needed for wheezing or shortness of breath. 1 Inhaler 2  . ALPRAZolam (XANAX) 0.25 MG tablet Take 1 tablet (0.25 mg total) by mouth 3 (three) times daily as needed for anxiety. 10 tablet 0  . gabapentin (NEURONTIN) 100 MG capsule Take 1 capsule (100 mg total) by mouth 3 (three) times daily. (Patient taking differently: Take 100 mg by  mouth at bedtime. ) 90 capsule 0  . linaclotide (LINZESS) 145 MCG CAPS capsule TAKE 1 CAPSULE (145 MCG TOTAL) BY MOUTH DAILY BEFORE BREAKFAST.    . meloxicam (MOBIC) 7.5 MG tablet Take by mouth.    . ondansetron (ZOFRAN) 4 MG tablet Take 4 mg by mouth as needed.     . ranitidine (ZANTAC) 150 MG tablet Take 150 mg by mouth daily.     . traMADol (ULTRAM) 50 MG tablet Take 50 mg by mouth every 6 (six) hours as needed for moderate pain.    Marland Kitchen umeclidinium-vilanterol (ANORO ELLIPTA) 62.5-25 MCG/INH AEPB Inhale 1 puff into the lungs daily. 60 each 4  . omeprazole (PRILOSEC) 40 MG capsule Take 1 capsule (40 mg total) by mouth daily. 30 capsule 3  . oxyCODONE-acetaminophen (PERCOCET/ROXICET) 5-325 MG tablet TAKE 1 TABLET BY MOUTH EVERY 6 HOURS AS NEEDED FOR SEVERE PAIN  0   No current facility-administered medications for this visit.     Allergies as of 11/20/2016  . (No Known Allergies)    ROS:  General: Negative for anorexia, weight loss, fever, chills, fatigue, weakness. ENT: Negative for hoarseness, difficulty swallowing , nasal congestion. CV: Negative for chest pain, angina, palpitations, dyspnea on exertion, peripheral edema.  Respiratory: Negative for dyspnea at rest, dyspnea on exertion, cough, sputum, wheezing.  GI: See history of present illness. GU:  Negative for dysuria, hematuria, urinary incontinence, urinary frequency, nocturnal urination.  Endo: Negative for unusual weight change.    Physical Examination:   BP 98/63   Pulse 89   Temp 98.2 F (36.8 C) (Oral)   Resp 16   Ht '5\' 5"'$  (1.651 m)   Wt 119 lb 12.8 oz (54.3 kg)   BMI 19.94 kg/m   General: Well-nourished, well-developed in no acute distress.  Eyes: No icterus. Conjunctivae pink. Mouth: Oropharyngeal mucosa moist and pink , no lesions erythema or exudate. Lungs: Clear to auscultation bilaterally. Non-labored. Heart: Regular rate and rhythm, no murmurs rubs or gallops.  Abdomen: Bowel sounds are normal,  nontender, nondistended, no hepatosplenomegaly or masses, no abdominal bruits or hernia , no rebound or guarding.   Extremities: No lower extremity edema. No clubbing or deformities. Neuro: Alert and oriented x 3.  Grossly intact. Skin: Warm and dry, no jaundice.   Psych: Alert and cooperative, normal mood and affect.  Imaging Studies: Dg Chest 2 View  Result Date: 11/19/2016 CLINICAL DATA:  Known lung CA, left side lobectomy last fall (2017 Oct?), now with increasing SOB and anterior chest pain since yesterday. Hx of PTX on left side per patient EXAM: CHEST  2 VIEW COMPARISON:  Chest CT, 11/17/2016.  Chest radiograph, 06/06/2016. FINDINGS: The cardiac silhouette is normal in size and configuration. No mediastinal or hilar masses. Postsurgical changes on the left with anastomosis staples along the left hilum and left-sided volume loss are stable from the prior exams. Right lung is hyperexpanded. No evidence of pneumonia or pulmonary edema. No lung mass or nodule. No pleural effusion or pneumothorax. The skeletal structures are demineralized but intact. IMPRESSION: 1. No acute cardiopulmonary disease. 2. Stable changes from a left lobectomy for treatment of lung carcinoma. 3. COPD. Electronically Signed   By: Lajean Manes M.D.   On: 11/19/2016 08:26   Ct Chest Wo Contrast  Result Date: 11/17/2016 CLINICAL DATA:  Shortness of breath and tenderness EXAM: CT CHEST WITHOUT CONTRAST TECHNIQUE: Multidetector CT imaging of the chest was performed following the standard protocol without IV contrast. COMPARISON:  06/06/2016 FINDINGS: Cardiovascular: Normal heart size. Aortic atherosclerosis. Calcification involving the LAD coronary artery noted. No pericardial effusion. Mediastinum/Nodes: No enlarged mediastinal or axillary lymph nodes. Thyroid gland, trachea, and esophagus demonstrate no significant findings. Lungs/Pleura: There is mild changes of centrilobular emphysema. Status post left upper lobectomy. There  is a tiny lung nodule in the anterior left lower lobe measuring 4 mm. Unchanged from previous exam, image 41 of series 3. 5 mm right middle lobe lung nodule is identified, image 67 of series 3. Stable from previous exam. Upper Abdomen: Previously characterized liver hemangioma in the right lobe of liver is unchanged measuring 1.6 cm. Musculoskeletal: Degenerative disc disease is identified within the thoracic spine. No aggressive lytic or sclerotic bone lesions. IMPRESSION: 1. Stable CT of the chest status post left upper lobectomy. No specific findings to suggest residual or recurrence of tumor. 2. Nonspecific nodules in the right middle lobe and left lower lobe are unchanged from previous exam. Attention on follow-up imaging advise. 3. Aortic Atherosclerosis (ICD10-I70.0) and Emphysema (ICD10-J43.9). Electronically Signed   By: Kerby Moors M.D.   On: 11/17/2016 08:50   Ct Angio Chest Pe W And/or Wo Contrast  Result Date: 11/19/2016 CLINICAL DATA:  PER ER NOTES: Pt c/o chest pain and SOB since yesterday. Hx lung cancer, left lower lobectomy, and collapsed lung per pt report. Chest pain 9/10. EXAM: CT ANGIOGRAPHY CHEST WITH CONTRAST  TECHNIQUE: Multidetector CT imaging of the chest was performed using the standard protocol during bolus administration of intravenous contrast. Multiplanar CT image reconstructions and MIPs were obtained to evaluate the vascular anatomy. CONTRAST:  75 mL of Isovue 370 intravenous contrast COMPARISON:  Current chest radiograph.  Chest CT, 11/18/2016. FINDINGS: Cardiovascular: Satisfactory opacification of the pulmonary arteries to the segmental level. No evidence of pulmonary embolism. Heart is normal in size and configuration. No pericardial effusion. There are moderate coronary artery calcifications. Aorta is normal in caliber. No dissection. Mild atherosclerosis noted along the arch and descending thoracic aorta. Mediastinum/Nodes: No neck base, axillary, mediastinal or hilar  masses or adenopathy. Trachea is widely patent. Esophagus is unremarkable. Lungs/Pleura: Status post left upper lobectomy. 4 mm nodule in the right upper lobe, image 41, series 6, stable. Two small, 2-3 mm, adjacent nodules in the left upper lobe, image 24, series 6. These are also stable. There are no new nodules. No evidence of pneumonia. No pulmonary edema. Changes of centrilobular emphysema are stable. No pleural effusion. No pneumothorax. Upper Abdomen: No acute abnormality. Musculoskeletal: No chest wall abnormality. No acute or significant osseous findings. Review of the MIP images confirms the above findings. IMPRESSION: 1. No evidence of a pulmonary embolus. 2. No acute findings.  No evidence of pneumonia or pulmonary edema. 3. Stable changes of emphysema and stable changes from a previous left upper lobectomy. 4. Stable small pulmonary nodules as detailed. Electronically Signed   By: Lajean Manes M.D.   On: 11/19/2016 10:06    Assessment and Plan:   Sheri Gatchel is a 61 y.o. y/o female here for follow up. Active issues    1. H pylori gastritis- completed treatment -  will recheck for eradication- last two visits has not obtained the stool tests that I ordered. She has a chest discomfort today that was evaluated in the ER which could be from esophagitis , will treat with Sucralfate as well as omeprazole which she has not been taking as directed   2. Irritable bowel syndrome with constipation - On linzess 145 mcg a day and doing well.   Dr [name]  MD Follow up in 3 months

## 2016-11-21 ENCOUNTER — Encounter: Payer: Self-pay | Admitting: Internal Medicine

## 2016-11-21 ENCOUNTER — Ambulatory Visit (INDEPENDENT_AMBULATORY_CARE_PROVIDER_SITE_OTHER): Payer: BLUE CROSS/BLUE SHIELD | Admitting: Internal Medicine

## 2016-11-21 ENCOUNTER — Other Ambulatory Visit: Payer: Self-pay | Admitting: *Deleted

## 2016-11-21 ENCOUNTER — Other Ambulatory Visit
Admission: RE | Admit: 2016-11-21 | Discharge: 2016-11-21 | Disposition: A | Discharge status: Home / Self Care | Payer: BLUE CROSS/BLUE SHIELD | Source: Ambulatory Visit | Attending: Gastroenterology | Admitting: Gastroenterology

## 2016-11-21 VITALS — BP 116/82 | HR 104 | Resp 16 | Ht 65.0 in | Wt 118.0 lb

## 2016-11-21 DIAGNOSIS — A048 Other specified bacterial intestinal infections: Secondary | ICD-10-CM | POA: Insufficient documentation

## 2016-11-21 DIAGNOSIS — J449 Chronic obstructive pulmonary disease, unspecified: Secondary | ICD-10-CM

## 2016-11-21 MED ORDER — UMECLIDINIUM-VILANTEROL 62.5-25 MCG/INH IN AEPB: 1.0000 | INHALATION_SPRAY | Freq: Every day | RESPIRATORY_TRACT | 3 refills | Status: DC

## 2016-11-21 MED ORDER — ALBUTEROL SULFATE HFA 108 (90 BASE) MCG/ACT IN AERS: 2.0000 | INHALATION_SPRAY | RESPIRATORY_TRACT | 6 refills | Status: DC | PRN

## 2016-11-21 MED ORDER — UMECLIDINIUM-VILANTEROL 62.5-25 MCG/INH IN AEPB: 1.0000 | INHALATION_SPRAY | Freq: Every day | RESPIRATORY_TRACT | 0 refills | Status: DC

## 2016-11-21 NOTE — Addendum Note (Signed)
Addended by: Devona Konig on: 11/21/2016 11:33 AM   Modules accepted: Orders

## 2016-11-21 NOTE — Progress Notes (Signed)
[facility] Consultation      Date: 11/21/2016,   MRN# [id number] Sheila Suarez 05/09/1956 Code Status:  Code Status History    Date Active Date Inactive Code Status Order ID Comments User Context   04/28/2016 12:21 PM 05/04/2016  3:22 PM Full Code [id number]  [name], MD Inpatient   04/17/2016  2:56 PM 04/24/2016  4:20 PM Full Code [id number]  [name], MD Inpatient     Hosp day:'@LENGTHOFSTAYDAYS'$ @ Referring MD: '@ATDPROV'$ @     PCP:      AdmissionWeight: 118 lb (53.5 kg)                 CurrentWeight: 118 lb (53.5 kg) Sheila Suarez is a 61 y.o. old female seen in consultation for SOB at the request of Dr. Genevive Bi     CHIEF COMPLAINT:   SOB   HISTORY OF PRESENT ILLNESS   61 AAF with chronic tobacco abuse Quit smoking 03/2016 Had dx of LUL nodule since 2012 Underwent LUL lung resection by Dr Genevive Bi 03/2016 as requested by oncology Benign findings per path reprots  She is very anxious and and still has chest wall pain She states that she is having chest pain  Her ONO and 6MWT WNL no evidence of hypoxia CT chest shows no significant findings except some mild emphysematous changes    Also with chest wall pain since surgery Has seen pain clinic for and was told she can get injection BUT has refused  She has no signs of infection at this time  I have advised her to call her Cardiologist PFT ratio 78%-[id number]/75 435predicted mild obstruction Moderate restriction-NL DLCO when corrected for volumes  PFT PFT   Current Medication:  Current Outpatient Prescriptions:  .  albuterol (PROVENTIL HFA;VENTOLIN HFA) 108 (90 Base) MCG/ACT inhaler, Inhale 2 puffs into the lungs every 6 (six) hours as needed for wheezing or shortness of breath., Disp: 1 Inhaler, Rfl: 2 .  ALPRAZolam (XANAX) 0.25 MG tablet, Take 1 tablet (0.25 mg total) by mouth 3 (three) times daily as needed for anxiety., Disp: 10 tablet, Rfl: 0 .  gabapentin (NEURONTIN) 100 MG capsule, Take  1 capsule (100 mg total) by mouth 3 (three) times daily. (Patient taking differently: Take 100 mg by mouth at bedtime. ), Disp: 90 capsule, Rfl: 0 .  linaclotide (LINZESS) 145 MCG CAPS capsule, TAKE 1 CAPSULE (145 MCG TOTAL) BY MOUTH DAILY BEFORE BREAKFAST., Disp: 30 capsule, Rfl: 3 .  meloxicam (MOBIC) 7.5 MG tablet, Take by mouth., Disp: , Rfl:  .  omeprazole (PRILOSEC) 40 MG capsule, Take 1 capsule (40 mg total) by mouth daily., Disp: 30 capsule, Rfl: 3 .  ondansetron (ZOFRAN) 4 MG tablet, Take 4 mg by mouth as needed. , Disp: , Rfl:  .  ranitidine (ZANTAC) 150 MG tablet, Take 150 mg by mouth daily. , Disp: , Rfl:  .  sucralfate (CARAFATE) 1 GM/10ML suspension, Take 10 mLs (1 g total) by mouth 4 (four) times daily., Disp: 420 mL, Rfl: 1 .  traMADol (ULTRAM) 50 MG tablet, Take 50 mg by mouth every 6 (six) hours as needed for moderate pain., Disp: , Rfl:  .  umeclidinium-vilanterol (ANORO ELLIPTA) 62.5-25 MCG/INH AEPB, Inhale 1 puff into the lungs daily., Disp: 60 each, Rfl: 4    ALLERGIES   Patient has no known allergies.     REVIEW OF SYSTEMS   Review of Systems  Constitutional: Negative for chills, diaphoresis, fever, malaise/fatigue and weight loss.  HENT: Negative for congestion  and hearing loss.   Eyes: Negative for blurred vision and double vision.  Respiratory: Positive for shortness of breath and wheezing. Negative for cough, hemoptysis and sputum production.   Cardiovascular: Positive for chest pain. Negative for palpitations, orthopnea and leg swelling.  Gastrointestinal: Negative for abdominal pain, heartburn, nausea and vomiting.  Genitourinary: Negative for dysuria and urgency.  Musculoskeletal: Negative for back pain, myalgias and neck pain.  Skin: Negative for rash.  Neurological: Negative for dizziness, tingling, tremors, weakness and headaches.  Endo/Heme/Allergies: Does not bruise/bleed easily.  Psychiatric/Behavioral: The patient is nervous/anxious.   All other  systems reviewed and are negative.    VS: BP 116/82 (BP Location: Left Arm, Patient Position: Sitting, Cuff Size: Normal)   Pulse (!) 104   Resp 16   Ht '5\' 5"'$  (1.651 m)   Wt 118 lb (53.5 kg)   SpO2 97%   BMI 19.64 kg/m      PHYSICAL EXAM  Physical Exam  Constitutional: She is oriented to person, place, and time. She appears well-developed and well-nourished. No distress.  HENT:  Head: Normocephalic and atraumatic.  Mouth/Throat: No oropharyngeal exudate.  Eyes: EOM are normal. Pupils are equal, round, and reactive to light. No scleral icterus.  Neck: Normal range of motion. Neck supple.  Cardiovascular: Normal rate, regular rhythm and normal heart sounds.   No murmur heard. Pulmonary/Chest: No stridor. No respiratory distress. She has no wheezes.  Left chest wall pain with palpation  Abdominal: Soft. Bowel sounds are normal.  Musculoskeletal: Normal range of motion. She exhibits no edema.  Neurological: She is alert and oriented to person, place, and time. No cranial nerve deficit.  Skin: Skin is warm. She is not diaphoretic.  Psychiatric: She has a normal mood and affect.      IMAGING    CT chest 10/2016 I have Independently reviewed images of  CT chest   on 11/21/2016 Interpretation:no acute findings, no effusions, no massess  PFT 08/29/16  Mod restrictive Lung disease FVC 63% predicted 2L FEF 25/75 53% predicted  Mixed mild obstructive small airways disease   ASSESSMENT/PLAN   61 thin AAF seen today for SOB with exertion with h/o chronic tobacco abuse with h/o LUL nodule s/p left upper lobe Lung resection with benign findings with signs and symptoms of reactive airways disease with small airways obstruction(COPD) and left chest wall pain  Her ONO and 6MWT were WNL, her level of SOB does not correlate with CT findings or PFT findings  1.continue ANORO for mild COPD 2.patient advised to follow up with pain clinic, advised to take pain meds(tylenol,motrin) as  needed as she has not tried this 3.patient advised to call cardiology for atypical chest pain   Follow up in 1 year  Evon Dejarnett Patricia Pesa, M.D.  [facility]  Medical Director [place] Director [place] Department

## 2016-11-21 NOTE — Patient Instructions (Signed)
Continue ANORO Albuterol as needed Follow up with cardiology

## 2016-11-23 LAB — H. PYLORI ANTIGEN, STOOL: H. PYLORI STOOL AG, EIA: NEGATIVE

## 2016-12-12 ENCOUNTER — Encounter: Payer: Self-pay | Admitting: Anesthesiology

## 2016-12-12 ENCOUNTER — Other Ambulatory Visit: Payer: Self-pay | Admitting: Anesthesiology

## 2016-12-12 ENCOUNTER — Ambulatory Visit (HOSPITAL_BASED_OUTPATIENT_CLINIC_OR_DEPARTMENT_OTHER): Payer: BLUE CROSS/BLUE SHIELD | Admitting: Anesthesiology

## 2016-12-12 ENCOUNTER — Ambulatory Visit
Admission: RE | Admit: 2016-12-12 | Discharge: 2016-12-12 | Disposition: A | Discharge status: Home / Self Care | Payer: BLUE CROSS/BLUE SHIELD | Source: Ambulatory Visit | Attending: Anesthesiology | Admitting: Anesthesiology

## 2016-12-12 VITALS — BP 141/90 | HR 54 | Temp 98.1°F | Resp 13 | Ht 65.0 in | Wt 121.0 lb

## 2016-12-12 DIAGNOSIS — G8912 Acute post-thoracotomy pain: Secondary | ICD-10-CM | POA: Insufficient documentation

## 2016-12-12 DIAGNOSIS — I2510 Atherosclerotic heart disease of native coronary artery without angina pectoris: Secondary | ICD-10-CM | POA: Insufficient documentation

## 2016-12-12 DIAGNOSIS — G588 Other specified mononeuropathies: Secondary | ICD-10-CM

## 2016-12-12 DIAGNOSIS — J439 Emphysema, unspecified: Secondary | ICD-10-CM | POA: Insufficient documentation

## 2016-12-12 DIAGNOSIS — M329 Systemic lupus erythematosus, unspecified: Secondary | ICD-10-CM | POA: Insufficient documentation

## 2016-12-12 DIAGNOSIS — M546 Pain in thoracic spine: Secondary | ICD-10-CM

## 2016-12-12 DIAGNOSIS — K219 Gastro-esophageal reflux disease without esophagitis: Secondary | ICD-10-CM | POA: Diagnosis not present

## 2016-12-12 DIAGNOSIS — R52 Pain, unspecified: Secondary | ICD-10-CM

## 2016-12-12 DIAGNOSIS — Z87891 Personal history of nicotine dependence: Secondary | ICD-10-CM | POA: Diagnosis not present

## 2016-12-12 DIAGNOSIS — G8929 Other chronic pain: Secondary | ICD-10-CM

## 2016-12-12 MED ORDER — DEXAMETHASONE SODIUM PHOSPHATE 10 MG/ML IJ SOLN
10.0000 mg | Freq: Once | INTRAMUSCULAR | Status: AC
  Administered 2016-12-12: 10 mg

## 2016-12-12 MED ORDER — LACTATED RINGERS IV SOLN: 1000.0000 mL | INTRAVENOUS | Status: DC

## 2016-12-12 MED ORDER — DEXAMETHASONE SODIUM PHOSPHATE 10 MG/ML IJ SOLN
INTRAMUSCULAR | Status: AC
  Filled 2016-12-12: qty 1

## 2016-12-12 MED ORDER — ROPIVACAINE HCL 2 MG/ML IJ SOLN
INTRAMUSCULAR | Status: AC
  Filled 2016-12-12: qty 10

## 2016-12-12 MED ORDER — LIDOCAINE HCL (PF) 1 % IJ SOLN
INTRAMUSCULAR | Status: AC
  Filled 2016-12-12: qty 10

## 2016-12-12 MED ORDER — MIDAZOLAM HCL 5 MG/5ML IJ SOLN
5.0000 mg | Freq: Once | INTRAMUSCULAR | Status: AC
  Administered 2016-12-12: 14:00:00 via INTRAVENOUS

## 2016-12-12 MED ORDER — LIDOCAINE HCL (PF) 1 % IJ SOLN
5.0000 mL | Freq: Once | INTRAMUSCULAR | Status: AC
  Administered 2016-12-12: 5 mL via SUBCUTANEOUS

## 2016-12-12 MED ORDER — ROPIVACAINE HCL 2 MG/ML IJ SOLN
10.0000 mL | Freq: Once | INTRAMUSCULAR | Status: AC
  Administered 2016-12-12: 10 mL via EPIDURAL

## 2016-12-12 MED ORDER — MIDAZOLAM HCL 5 MG/5ML IJ SOLN
INTRAMUSCULAR | Status: AC
  Filled 2016-12-12: qty 5

## 2016-12-12 NOTE — Progress Notes (Signed)
Safety precautions to be maintained throughout the outpatient stay will include: orient to surroundings, keep bed in low position, maintain call bell within reach at all times, provide assistance with transfer out of bed and ambulation.  

## 2016-12-12 NOTE — Patient Instructions (Signed)
Post-procedure Information What to expect: Most procedures involve the use of a local anesthetic (numbing medicine), and a steroid (anti-inflammatory medicine).  The local anesthetics may cause temporary numbness and weakness of the legs or arms, depending on the location of the block. This numbness/weakness may last 4-6 hours, depending on the local anesthetic used. In rare instances, it can last up to 24 hours. While numb, you must be very careful not to injure the extremity.  After any procedure, you could expect the pain to get better within 15-20 minutes. This relief is temporary and may last 4-6 hours. Once the local anesthetics wears off, you could experience discomfort, possibly more than usual, for up to 10 (ten) days. In the case of radiofrequencies, it may last up to 6 weeks. Surgeries may take up to 8 weeks for the healing process. The discomfort is due to the irritation caused by needles going through skin and muscle. To minimize the discomfort, we recommend using ice the first day, and heat from then on. The ice should be applied for 15 minutes on, and 15 minutes off. Keep repeating this cycle until bedtime. Avoid applying the ice directly to the skin, to prevent frostbite. Heat should be used daily, until the pain improves (4-10 days). Be careful not to burn yourself.  Occasionally you may experience muscle spasms or cramps. These occur as a consequence of the irritation caused by the needle sticks to the muscle and the blood that will inevitably be lost into the surrounding muscle tissue. Blood tends to be very irritating to tissues, which tend to react by going into spasm. These spasms may start the same day of your procedure, but they may also take days to develop. This late onset type of spasm or cramp is usually caused by electrolyte imbalances triggered by the steroids, at the level of the kidney. Cramps and spasms tend to respond well to muscle relaxants, multivitamins (some are  triggered by the procedure, but may have their origins in vitamin deficiencies), and "Gatorade", or any sports drinks that can replenish any electrolyte imbalances. (If you are a diabetic, ask your pharmacist to get you a sugar-free brand.) Warm showers or baths may also be helpful. Stretching exercises are highly recommended.  General Instructions:  Be alert for signs of possible infection: redness, swelling, heat, red streaks, elevated temperature, and/or fever. These typically appear 4 to 6 days after the procedure. Immediately notify your doctor if you experience unusual bleeding, difficulty breathing, or loss of bowel or bladder control. If you experience increased pain, do not increase your pain medicine intake, unless instructed by your pain physician.  Post-Procedure Care:  Be careful in moving about. Muscle spasms in the area of the injection may occur. Applying ice or heat to the area is often helpful. The incidence of spinal headaches after epidural injections ranges between 1.4% and 6%. If you develop a headache that does not seem to respond to conservative therapy, please let your physician know. This can be treated with an epidural blood patch.   Post-procedure numbness or redness is to be expected, however it should average 4 to 6 hours. If numbness and weakness of your extremities begins to develop 4 to 6 hours after your procedure, and is felt to be progressing and worsening, immediately contact your physician.  Diet:  If you experience nausea, do not eat until this sensation goes away. If you had a "Stellate Ganglion Block" for upper extremity "Reflex Sympathetic Dystrophy", do not eat or drink until  your hoarseness goes away. In any case, always start with liquids first and if you tolerate them well, then slowly progress to more solid foods.  Activity:  For the first 4 to 6 hours after the procedure, use caution in moving about as you may experience numbness and/or weakness. Use  caution in cooking, using household electrical appliances, and climbing steps. If you need to reach your Doctor call our office: [phone] (During business hours) or [phone] (After business hours).  Business Hours: Monday-Thursday 8:00 am - 4:00 PM    Fridays: Closed     In case of an emergency: In case of emergency, call 911 or go to the nearest emergency room and have the physician there call us.  Interpretation of Procedure Every nerve block has two components: a diagnostic component, and a treatment component. Unrealistic expectations are the most common causes of "perceived failure".  In a perfect world, a single nerve block should be able to completely and permanently eliminate the pain. Sadly, the world is not perfect.  Most pain management nerve blocks are performed using local anesthetics and steroids. Steroids are responsible for any long-term benefit that you may experience. Their purpose is to decrease any chronic swelling that may exist in the area. Steroids begin to work immediately after being injected. However, most patients will not experience any benefits until 5 to 10 days after the injection, when the swelling has come down to the point where they can tell a difference. Steroids will only help if there is swelling to be treated. As such, they can assist with the diagnosis. If effective, they suggest an inflammatory component to the pain, and if ineffective, they rule out inflammation as the main cause or component of the problem. If the problem is one of mechanical compression, you will get no benefit from those steroids.   In the case of local anesthetics, they have a crucial role in the diagnosis of your condition. Most will begin to work within15 to 20 minutes after injection. The duration will depend on the type used (short- vs. Long-acting). It is of outmost importance that patients keep tract of their pain, after the procedure. To assist with this matter, a  "Post-procedure Pain Diary" is provided. Make sure to complete it and to bring it back to your follow-up appointment.  As long as the patient keeps accurate, detailed records of their symptoms after every procedure, and returns to have those interpreted, every procedure will provide [place] with invaluable information. Even a block that does not provide the patient with any relief, will always provide [place] with information about the mechanism and the origin of the pain. The only time a nerve block can be considered a waste of time is when patients do not keep track of the results, or do not keep their post-procedure appointment.  Reporting the results back to your physician The Pain Score  Pain is a subjective complaint. It cannot be seen, touched, or measured. We depend entirely on the patient's report of the pain in order to assess your condition and treatment. To evaluate the pain, we use a pain scale, where "0" means "No Pain", and a "10" is "the worst possible pain that you can even imagine" (i.e. something like been eaten alive by a shark or being torn apart by a lion).   Use the Pain Scale provided. You will frequently be asked to rate your pain. Please be accurate, remember that medical decisions will be based on your responses. Please  do not rate your pain above a 10. Doing so is actually interpreted as "symptom magnification" (exaggeration). To put this into perspective, when you tell us that your pain is at a 10 (ten), what you are saying is that there is nothing we can do to make this pain any worse. (Carefully think about that.) _____________________________________________________________________________________________  Intercostal Nerve Block Patient Information  Description: The twelve intercostal nerves arise from the first thru twelfth thoracic nerve roots.  The nerve begins at the spine and wraps around the body, lying in a groove underneath each rib.  Each intercostal nerve innervates a  specific strip of skin and body walk of the abdomen and chest.  Therefore, injuries of the chest wall or abdominal wall result in pain that is transmitted back to the brian via the intercostal nerves.  Examples of such injuries include rib fractures and incisions for lung and gall bladder surgery.  Occasionally, pain may persist long after an injury or surgical incision secondary to inflammation and irritation of the intercostal nerve.  The longstanding pain is known as intercostal neuralgia.  An intercostal nerve block is preformed to eliminate pain either temporarily or permanently.  A small needle is placed below the rib and local anesthetic (like Novocaine) and possibly steroid is injected.  Usually 2-4 intercostal nerves are blocked at a time depending on the problem.  The patient will experience a slight "pin-prick" sensation for each injection.  Shortly thereafter, the strip of skin that is innervated by the blocked intercostal nerve will feel numb.  Persistent pain that is only temporarily relieved with local anesthetic may require a more permanent block. This procedure is called Cryoneurolysis and entails placing a small probe beneath the rib to freeze the nerve.  Conditions that may be treated by intercostal nerve blocks:   Rib fractures  Longstanding pain from surgery of the chest or abdomen (intercostal neuralgia)  Pain from chest tubes  Pain from trauma to the chest  Preparation for the injections:  1. Do not eat any solid food or dairy products within 8 hours of your appointment. 2. You may drink clear liquids up to 3 hours before appointment.  Clear liquids include water, black coffee, juice or soda.  No milk or cream please. 3. You may take your regular medication, including pain medications, with a sip of water before your appointment.  Diabetics should hold regular insulin (if take separately) and take 1/2 normal NPH dose the morning of the procedure.   Carry some sugar containing  items with you to your appointment. 4. A driver must accompany you and be prepared to drive you home after your procedure. 5. Bring all your current medications with you. 6. An IV may be inserted and sedation may be given at the discretion of the physician. 7. A blood pressure cuff, EKG and other monitors will often be applied during the procedure.  Some patients may need to have extra oxygen administered for a short period. 8. You will be asked to provide medical information, including your allergies, prior to the procedure.  We must know immediately if you are taking blood thinners (like Coumadin/Warfarin) or if you are allergic to IV iodine contrast (dye). We must know if you could possible be pregnant.  Possible side-effects:   Bleeding from needle site  Infection (rare)  Nerve injury (rare)  Numbness & tingling of skin  Collapsed lung requiring chest tube (rare)  Local anesthetic toxicity (rare)  Light-headedness (temporary)  Pain at injection site (several days)  Decreased blood pressure (temporary)  Shortness of breath  Jittery/shaking sensation (temporary)  Call if you experience:   Difficulty breathing or hives (go directly to the emergency room)  Redness, inflammation or drainage at the injection site  Severe pain at the site of the injection  Any new symptoms which are concerning   Please note:  Your pain may subside immediately but may return several hours after the injection.  Often, more than one injection is required to reduce the pain. Also, if several temporary blocks with local anesthetic are ineffective, a more permanent block with cryolysis may be necessary.  This will be discussed with you should this be the case.  If you have any questions, please call [phone] [facility]

## 2016-12-13 NOTE — Progress Notes (Signed)
Subjective:  Patient ID: Sheila Suarez, female    DOB: 29-Jun-1956  Age: 61 y.o. MRN: [id number]  CC: Chest Pain (rib cage  on left)  Service Provided on Last Visit: Evaluation PROCEDURE:Left posterior thoracic intercostal nerve block under fluoroscopic guidance with moderate sedation at T6 T7 T8  HPI Sheila Suarez presents for Reevaluation. The quality characteristic and distribution of her thoracic pain have remained stable. She continues to have radiating pain into the left chest and under the left breast. She desires to proceed with her first intercostal nerve block today. Otherwise no changes are noted in the quality characteristic or distribution of her pain.    new patient evaluation. She is a pleasant 61 year old female that has had a recent thoracotomy for a lobe resection with Dr. Faith Rogue. She is reporting some persistent pain in a dermatomal distribution left side in the mid axillary line and below the posterior left scapula. The pain quality is cramping with associated itching and nagging continuous symptoms. Occasionally the pain as stabbing but is generally uncomfortable with a pain score of 5 out of 10. It does not appear to be influenced by time of day and has persisted since her surgery in September 2017. No associated shortness of breath or chest pain or difficulty with inspiration or expiration are noted. She has some cramping pain at night and despite medication management the pain persists. She reports having had a thorough postoperative workup with Dr. Faith Rogue which was otherwise unremarkable. Postoperative associated problems.  History Sheila Suarez has a past medical history of Cancer ([place]); Coronary artery disease; Emphysema of lung ([place]); GERD (gastroesophageal reflux disease); Lupus; and Shortness of breath dyspnea.   She has a past surgical history that includes Tubal ligation (Bilateral); Cyst excision (Left); Flexible bronchoscopy (N/A, 04/17/2016); Thoracotomy (Left,  04/17/2016); Colonoscopy with propofol (N/A, 06/02/2016); Esophagogastroduodenoscopy (egd) with propofol (N/A, 06/02/2016); Lung removal, partial (Left); left upper lobectomy (Left, 03/2016); and Hand surgery (Left).   Her family history includes Cancer in her maternal aunt, maternal aunt, and maternal uncle; Cancer (age of onset: 63) in her daughter; Heart disease in her father; Lung cancer in her brother and sister; Pancreatic cancer in her sister; Stomach cancer in her paternal grandmother.She reports that she quit smoking about 8 months ago. Her smoking use included Cigarettes. She has a 8.75 pack-year smoking history. She quit smokeless tobacco use about 8 months ago. She reports that she drinks about 0.6 oz of alcohol per week . She reports that she does not use drugs.  No results found for this or any previous visit.  No results found for: TOXASSSELUR  Outpatient Medications Prior to Visit  Medication Sig Dispense Refill  . albuterol (PROVENTIL HFA;VENTOLIN HFA) 108 (90 Base) MCG/ACT inhaler Inhale 2 puffs into the lungs every 6 (six) hours as needed for wheezing or shortness of breath. 1 Inhaler 2  . ALPRAZolam (XANAX) 0.25 MG tablet Take 1 tablet (0.25 mg total) by mouth 3 (three) times daily as needed for anxiety. 10 tablet 0  . gabapentin (NEURONTIN) 100 MG capsule Take 1 capsule (100 mg total) by mouth 3 (three) times daily. (Patient taking differently: Take 100 mg by mouth at bedtime. ) 90 capsule 0  . linaclotide (LINZESS) 145 MCG CAPS capsule TAKE 1 CAPSULE (145 MCG TOTAL) BY MOUTH DAILY BEFORE BREAKFAST. 30 capsule 3  . meloxicam (MOBIC) 7.5 MG tablet daily.     Marland Kitchen omeprazole (PRILOSEC) 40 MG capsule Take 1 capsule (40 mg total) by mouth daily. 30 capsule 3  .  ondansetron (ZOFRAN) 4 MG tablet Take 4 mg by mouth as needed.     . ranitidine (ZANTAC) 150 MG tablet Take 150 mg by mouth daily.     . sucralfate (CARAFATE) 1 GM/10ML suspension Take 10 mLs (1 g total) by mouth 4 (four) times  daily. 420 mL 1  . traMADol (ULTRAM) 50 MG tablet Take 50 mg by mouth every 6 (six) hours as needed for moderate pain.    Marland Kitchen umeclidinium-vilanterol (ANORO ELLIPTA) 62.5-25 MCG/INH AEPB Inhale 1 puff into the lungs daily. 60 each 4  . albuterol (PROVENTIL HFA;VENTOLIN HFA) 108 (90 Base) MCG/ACT inhaler Inhale 2 puffs into the lungs every 4 (four) hours as needed for wheezing or shortness of breath. (Patient not taking: Reported on 12/12/2016) 1 Inhaler 6  . umeclidinium-vilanterol (ANORO ELLIPTA) 62.5-25 MCG/INH AEPB Inhale 1 puff into the lungs daily. (Patient not taking: Reported on 12/12/2016) 60 each 3   No facility-administered medications prior to visit.    Lab Results  Component Value Date   WBC 8.9 11/19/2016   HGB 12.8 11/19/2016   HCT 39.0 11/19/2016   PLT 420 11/19/2016   GLUCOSE 82 11/19/2016   ALT 11 (L) 11/19/2016   AST 16 11/19/2016   NA 137 11/19/2016   K 3.7 11/19/2016   CL 105 11/19/2016   CREATININE 0.69 11/19/2016   BUN 9 11/19/2016   CO2 25 11/19/2016   TSH 0.635 02/09/2016   INR 0.91 03/28/2016    --------------------------------------------------------------------------------------------------------------------- Ct Angio Neck W Or Wo Contrast  Result Date: 06/06/2016 CLINICAL DATA:  Initial evaluation for acute onset severe anterior neck pain. EXAM: CT ANGIOGRAPHY NECK TECHNIQUE: Multidetector CT imaging of the neck was performed using the standard protocol during bolus administration of intravenous contrast. Multiplanar CT image reconstructions and MIPs were obtained to evaluate the vascular anatomy. Carotid stenosis measurements (when applicable) are obtained utilizing NASCET criteria, using the distal internal carotid diameter as the denominator. CONTRAST:  100 cc of Isovue 370. COMPARISON:  None available. FINDINGS: Aortic arch: Examination is somewhat technically limited due to timing of the contrast bolus. Aortic arch of normal caliber with normal 3 vessel  morphology. Scattered plaque about the origin of the great vessels without high-grade stenosis. Visualized subclavian arteries are widely patent. Right carotid system: Right common carotid artery patent from its origin to the bifurcation without stenosis or acute abnormality. No significant atheromatous disease about the right carotid bifurcation. Right ICA patent from the bifurcation to the skullbase without dissection, stenosis or occlusion. Right external carotid artery and its branches within normal limits. Left carotid system: Left common carotid artery patent from its origin to the bifurcation without stenosis or definite acute abnormality. Evaluation mildly limited by streak artifact from prominent contrast within the adjacent left internal jugular vein. Mild a centric plaque about the left bifurcation without significant stenosis. Left ICA patent distally to the skullbase without stenosis, dissection, or occlusion. Left external carotid artery and its branches within normal limits. Vertebral arteries: Both of the vertebral arteries arise from the subclavian arteries. Left vertebral artery not well evaluated proximally due to adjacent venous contrast. Proximal pre foraminal right V1 segment within normal limits. Vertebral arteries patent distally without stenosis, dissection, or occlusion. Distal left P2/P3 segments not well evaluated due to adjacent venous contrast as well. Visualized basilar artery widely patent. Skeleton: No acute osseous abnormality. No worrisome lytic or blastic osseous lesions. Other neck: Visualized soft tissues of the neck demonstrate no acute abnormality. Please note evaluation somewhat limited by streak  artifact from IV contrast within multiple venous collaterals within the neck. No definite adenopathy. Thyroid grossly unremarkable. Upper chest: Visualized upper chest demonstrates no definite acute abnormality. This is better evaluated on concomitant CT of the chest orbits same  time. Emphysema noted. IMPRESSION: 1. Negative CTA of the neck. No acute vascular abnormality identified within the major arterial vasculature of the neck. No high-grade or critical stenosis. 2. Mild atheromatous plaque about the left carotid bifurcation without stenosis. 3. Emphysema. Electronically Signed   By: Jeannine Boga M.D.   On: 06/06/2016 21:42   Dg Chest Portable 1 View  Result Date: 06/06/2016 CLINICAL DATA:  Upper central chest pain since 1600 hours today. Pain is worse with inspiration. EXAM: PORTABLE CHEST 1 VIEW COMPARISON:  05/26/2016 FINDINGS: Volume loss of the left lung status post reported left lower lobectomy with chain sutures noted about the left hilum as before. There is hyperinflation of right lung. The cardiac and mediastinal contours are stable in appearance with atherosclerosis of the aortic arch. The lungs are free of pneumonic consolidations. Pulmonary vasculature is unremarkable. No effusion or pneumothorax. No suspicious osseous abnormalities. IMPRESSION: Stable postoperative appearance of the chest with volume loss on the left. Compensatory hyperinflation of the right. Electronically Signed   By: Ashley Royalty M.D.   On: 06/06/2016 20:28   Ct Angio Chest Aorta W And/or Wo Contrast  Result Date: 06/06/2016 CLINICAL DATA:  Neck pain and history of prior left upper lobectomy for carcinoma EXAM: CT ANGIOGRAPHY CHEST WITH CONTRAST TECHNIQUE: Multidetector CT imaging of the chest was performed using the standard protocol during bolus administration of intravenous contrast. Multiplanar CT image reconstructions and MIPs were obtained to evaluate the vascular anatomy. CONTRAST:  100 mL Isovue 370. COMPARISON:  02/07/2016 FINDINGS: Cardiovascular: Thoracic aorta is within normal limits. No evidence of aneurysmal dilatation or dissection is noted. The pulmonary artery shows a normal branching pattern without evidence of filling defect. Mild coronary calcifications are seen.  Mediastinum/Nodes: The thoracic inlet is within normal limits. Considerable collateralization of venous flow is noted related to a focal area of narrowing within the left innominate vein best seen on image number 25 of series 6. No significant hilar or mediastinal adenopathy is identified. Lungs/Pleura: Postsurgical changes are noted consistent with the left upper lobectomy. The right lung is well aerated without focal infiltrate or sizable effusion. Very mild emphysematous changes are seen. The residual left lung is within normal limits. No evidence of recurrence is seen. Upper Abdomen: Hypodensity is again noted within the right lobe of the liver stable from prior exam dated 02/07/2016 consistent with hemangioma. The remainder the upper abdomen is within normal limits. Musculoskeletal: No acute bony abnormality is noted. Review of the MIP images confirms the above findings. IMPRESSION: Status post left upper lobectomy. 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 No evidence of pulmonary emboli.  No aortic dissection is seen. Stable hepatic hemangioma. Electronically Signed   By: Inez Catalina M.D.   On: 06/06/2016 21:27       ---------------------------------------------------------------------------------------------------------------------- Past Medical History:  Diagnosis Date  . Cancer ([place])   . Coronary artery disease   . Emphysema of lung ([place])   . GERD (gastroesophageal reflux disease)   . Lupus   . Shortness of breath dyspnea     Past Surgical History:  Procedure Laterality Date  . COLONOSCOPY WITH PROPOFOL N/A 06/02/2016   Procedure: COLONOSCOPY WITH PROPOFOL;  Surgeon: [name], MD;  Location: [facility] ENDOSCOPY;  Service: Gastroenterology;  Laterality: N/A;  . CYST EXCISION Left    hand  . ESOPHAGOGASTRODUODENOSCOPY (EGD) WITH PROPOFOL N/A 06/02/2016   Procedure: ESOPHAGOGASTRODUODENOSCOPY (EGD) WITH PROPOFOL;   Surgeon: [name], MD;  Location: [facility] ENDOSCOPY;  Service: Gastroenterology;  Laterality: N/A;  . FLEXIBLE BRONCHOSCOPY N/A 04/17/2016   Procedure: FLEXIBLE BRONCHOSCOPY;  Surgeon: [name], MD;  Location: [facility] ORS;  Service: Thoracic;  Laterality: N/A;  . HAND SURGERY Left   . left upper lobectomy Left 03/2016  . LUNG REMOVAL, PARTIAL Left    Left lower lobe  . THORACOTOMY Left 04/17/2016   Procedure: THORACOTOMY LEFT UPPER LOBE LUNG RESECTION;  Surgeon: [name], MD;  Location: [facility] ORS;  Service: Thoracic;  Laterality: Left;  . TUBAL LIGATION Bilateral    37 years ago    Family History  Problem Relation Age of Onset  . Pancreatic cancer Sister   . Lung cancer Sister   . Lung cancer Brother   . Stomach cancer Paternal Grandmother   . Heart disease Father   . Cancer Daughter 68       cervical  . Cancer Maternal Aunt        brain  . Cancer Maternal Uncle        prostate  . Cancer Maternal Aunt        bone    Social History  Substance Use Topics  . Smoking status: Former Smoker    Packs/day: 0.25    Years: 35.00    Types: Cigarettes    Quit date: 03/19/2016  . Smokeless tobacco: Former Systems developer    Quit date: 03/16/2016  . Alcohol use 0.6 oz/week    1 Cans of beer per week     Comment: Rarely    ---------------------------------------------------------------------------------------------------------------------   BP (!) 141/90   Pulse (!) 54   Temp 98.1 F (36.7 C)   Resp 13   Ht '5\' 5"'$  (1.651 m)   Wt 121 lb (54.9 kg)   SpO2 98%   BMI 20.14 kg/m    BP Readings from Last 3 Encounters:  12/12/16 (!) 141/90  11/21/16 116/82  11/20/16 98/63     Wt Readings from Last 3 Encounters:  12/12/16 121 lb (54.9 kg)  11/21/16 118 lb (53.5 kg)  11/20/16 119 lb 12.8 oz (54.3 kg)     ----------------------------------------------------------------------------------------------------------------------  ROS Review of Systems  Cardiac: No chest  pain Pulmonary: No shortness of breath or dyspnea Psychologic: No history of drug abuse or suicidal ideation or anxiety  Objective:  BP (!) 141/90   Pulse (!) 54   Temp 98.1 F (36.7 C)   Resp 13   Ht '5\' 5"'$  (1.651 m)   Wt 121 lb (54.9 kg)   SpO2 98%   BMI 20.14 kg/m   Physical Exam Patient is alert oriented cooperative compliant Pupils are equally round reactive to light with extraocular muscles intact Lungs are clear to auscultation with distant breath sounds Heart is regular rate and rhythm       Assessment & Plan:   Sheila Suarez was seen today for chest pain.  Diagnoses and all orders for this visit:  Intercostal neuralgia -     ropivacaine (PF) 2 mg/mL (0.2%) (NAROPIN) injection 10 mL; 10 mLs by Epidural route once. -     midazolam (VERSED) 5 MG/5ML injection 5 mg; Inject 5 mLs (5 mg total) into the vein once. -     lactated ringers infusion 1,000 mL; Inject 1,000 mLs into the  vein continuous. -     lidocaine (PF) (XYLOCAINE) 1 % injection 5 mL; Inject 5 mLs into the skin once. -     dexamethasone (DECADRON) injection 10 mg; 1 mL (10 mg total) by Other route once. -     INTERCOSTAL NERVE BLOCK; Future -     INTERCOSTAL NERVE BLOCK -     ToxASSURE Select 13 (MW), Urine  Acute postthoracotomy pain -     INTERCOSTAL NERVE BLOCK -     ToxASSURE Select 13 (MW), Urine  Chronic left-sided thoracic back pain -     ropivacaine (PF) 2 mg/mL (0.2%) (NAROPIN) injection 10 mL; 10 mLs by Epidural route once. -     midazolam (VERSED) 5 MG/5ML injection 5 mg; Inject 5 mLs (5 mg total) into the vein once. -     lactated ringers infusion 1,000 mL; Inject 1,000 mLs into the vein continuous. -     lidocaine (PF) (XYLOCAINE) 1 % injection 5 mL; Inject 5 mLs into the skin once. -     dexamethasone (DECADRON) injection 10 mg; 1 mL (10 mg total) by Other route once. -     ToxASSURE Select 13 (MW),  Urine     ----------------------------------------------------------------------------------------------------------------------  Problem List Items Addressed This Visit    None    Visit Diagnoses    Intercostal neuralgia    -  Primary   Relevant Medications   ALPRAZolam (XANAX) 0.25 MG tablet   gabapentin (NEURONTIN) 100 MG capsule   ropivacaine (PF) 2 mg/mL (0.2%) (NAROPIN) injection 10 mL (Completed)   midazolam (VERSED) 5 MG/5ML injection 5 mg (Completed)   lactated ringers infusion 1,000 mL   lidocaine (PF) (XYLOCAINE) 1 % injection 5 mL (Completed)   dexamethasone (DECADRON) injection 10 mg (Completed)   Other Relevant Orders   INTERCOSTAL NERVE BLOCK   ToxASSURE Select 13 (MW), Urine   Acute postthoracotomy pain       Relevant Orders   ToxASSURE Select 13 (MW), Urine   Chronic left-sided thoracic back pain       Relevant Medications   gabapentin (NEURONTIN) 100 MG capsule   ropivacaine (PF) 2 mg/mL (0.2%) (NAROPIN) injection 10 mL (Completed)   midazolam (VERSED) 5 MG/5ML injection 5 mg (Completed)   lactated ringers infusion 1,000 mL   lidocaine (PF) (XYLOCAINE) 1 % injection 5 mL (Completed)   dexamethasone (DECADRON) injection 10 mg (Completed)   Other Relevant Orders   ToxASSURE Select 13 (MW), Urine      ----------------------------------------------------------------------------------------------------------------------  1. Acute postthoracotomy pain We'll proceed with her first diagnostic intercostal nerve block.  The risks and benefits have been reviewed and all questions answered.  She is to return to clinic in 1 month for reevaluation and her second injection at that time.    2. Chronic left-sided thoracic back pain Continue with physical therapy. We have also talked about medication management in addition to serotonin uptake inhibitors, tricyclic antidepressants, Lidoderm patch but she is interested in none of these  modalities.  ----------------------------------------------------------------------------------------------------------------------  I am having Ms. Ladson maintain her gabapentin, meloxicam, ondansetron, ranitidine, albuterol, umeclidinium-vilanterol, traMADol, ALPRAZolam, sucralfate, linaclotide, omeprazole, albuterol, umeclidinium-vilanterol, sucralfate, albuterol, ALPRAZolam, umeclidinium-vilanterol, and gabapentin. We administered ropivacaine (PF) 2 mg/mL (0.2%), midazolam, lidocaine (PF), and dexamethasone.   Meds ordered this encounter  Medications  . sucralfate (CARAFATE) 1 g tablet    Refill:  0  . albuterol (PROVENTIL HFA;VENTOLIN HFA) 108 (90 Base) MCG/ACT inhaler    Sig: Inhale into the lungs.  . ALPRAZolam (XANAX) 0.25 MG  tablet    Sig: Take by mouth.  . umeclidinium-vilanterol (ANORO ELLIPTA) 62.5-25 MCG/INH AEPB    Sig: Take by mouth.  . gabapentin (NEURONTIN) 100 MG capsule    Sig: Take by mouth.  . ropivacaine (PF) 2 mg/mL (0.2%) (NAROPIN) injection 10 mL  . midazolam (VERSED) 5 MG/5ML injection 5 mg  . lactated ringers infusion 1,000 mL  . lidocaine (PF) (XYLOCAINE) 1 % injection 5 mL  . dexamethasone (DECADRON) injection 10 mg    Procedure: Left side posterior thoracic intercostal nerve block at T6 T7 T8 with moderate sedation  Patient was taken to the fluoroscopy suite and placed in the prone position. IV sedation was utilized with 3 mg of Versed. As was well-tolerated and yielded moderate sedation. The area overlying the thoracic region was prepped with Betadine 3 after identifying the area overlying the T6 T7 T8 rib margins area and this is 5 cm to the left of midline. 1% lidocaine was infiltrated with a 25-gauge needle 1 cc to the subcutaneous region. I then advanced a 22-gauge 1.5 inch quickie needle to walk off the inferior posterior margin of the rib in the usual fashion. There was negative aspiration for heme or air. I then injected 3 cc of ropivacaine 0.2%  mixed with 3 mg of Decadron at T6 T7 and T8. The procedure was tolerated without difficulty and upon discharge she had resolution of her left thoracic pain vital signs were stable throughout the procedure and she is to return to clinic as indicated.   Follow-up: Return for evaluation, procedure.    Molli Barrows, MD 9:15 AM  The [place] practitioner database for opioid medications on this patient has been reviewed by me and my staff   Greater than 50% of the total encounter time was spent in counseling and / or coordination of care.     This dictation was performed utilizing Systems analyst.  Please excuse any unintentional or mistaken typographical errors as a result.

## 2016-12-17 ENCOUNTER — Emergency Department: Payer: BLUE CROSS/BLUE SHIELD

## 2016-12-17 ENCOUNTER — Emergency Department
Admission: EM | Admit: 2016-12-17 | Discharge: 2016-12-17 | Disposition: A | Discharge status: Home / Self Care | Payer: BLUE CROSS/BLUE SHIELD | Attending: Emergency Medicine | Admitting: Emergency Medicine

## 2016-12-17 DIAGNOSIS — Y929 Unspecified place or not applicable: Secondary | ICD-10-CM | POA: Insufficient documentation

## 2016-12-17 DIAGNOSIS — Z87891 Personal history of nicotine dependence: Secondary | ICD-10-CM | POA: Insufficient documentation

## 2016-12-17 DIAGNOSIS — Y939 Activity, unspecified: Secondary | ICD-10-CM | POA: Diagnosis not present

## 2016-12-17 DIAGNOSIS — S60222A Contusion of left hand, initial encounter: Secondary | ICD-10-CM | POA: Diagnosis not present

## 2016-12-17 DIAGNOSIS — I2510 Atherosclerotic heart disease of native coronary artery without angina pectoris: Secondary | ICD-10-CM | POA: Diagnosis not present

## 2016-12-17 DIAGNOSIS — Z85038 Personal history of other malignant neoplasm of large intestine: Secondary | ICD-10-CM | POA: Diagnosis not present

## 2016-12-17 DIAGNOSIS — Z79899 Other long term (current) drug therapy: Secondary | ICD-10-CM | POA: Diagnosis not present

## 2016-12-17 DIAGNOSIS — X509XXA Other and unspecified overexertion or strenuous movements or postures, initial encounter: Secondary | ICD-10-CM | POA: Insufficient documentation

## 2016-12-17 DIAGNOSIS — S6992XA Unspecified injury of left wrist, hand and finger(s), initial encounter: Secondary | ICD-10-CM | POA: Diagnosis present

## 2016-12-17 DIAGNOSIS — Y999 Unspecified external cause status: Secondary | ICD-10-CM | POA: Insufficient documentation

## 2016-12-17 NOTE — Discharge Instructions (Signed)
Please rest ice and elevate the left hand. Use buddy tape for the next 1-2 weeks to help with improving range of motion. Return to the ER for any worsening symptoms urgent changes in her health. Take Tylenol as needed for pain.

## 2016-12-17 NOTE — ED Triage Notes (Signed)
Pt came to ED via pov c/o middle finger pain on left hand after getting it twisted in dog's collar. Pt reports this happened Friday, pain increasing and pt worried she broke it.

## 2016-12-17 NOTE — ED Notes (Signed)
See triage note  States she was trying to hold her dog by the collar and got her hand twisted   haivng pain and swelling to left middle finger and hand

## 2016-12-17 NOTE — ED Provider Notes (Signed)
[facility] Provider Note   CSN: [id number] Arrival date & time: 12/17/16  1233     History   Chief Complaint Chief Complaint  Patient presents with  . Hand Pain    HPI Sheila Suarez is a 61 y.o. female presents to the emergency department for evaluation of left hand pain. Patient states left middle finger was caught in a dog leash and twisted 2 days ago. She developed pain and swelling the next morning. She has had persistent pain and swelling of the last couple days. Pain is moderate. No numbness or tingling. She denies any limited range of motion. No other injury to her body.    HPI  Past Medical History:  Diagnosis Date  . Cancer ([place])   . Coronary artery disease   . Emphysema of lung ([place])   . GERD (gastroesophageal reflux disease)   . Lupus   . Shortness of breath dyspnea     Patient Active Problem List   Diagnosis Date Noted  . Arthralgia of multiple joints 08/28/2016  . Rib pain 07/26/2016  . Irritable bowel syndrome with constipation 06/27/2016  . H. pylori infection 06/27/2016  . Abdominal pain, left upper quadrant   . Special screening for malignant neoplasms, colon   . Pneumothorax 04/28/2016  . Lung mass 04/17/2016  . CAD (coronary artery disease) 02/28/2016  . Shortness of breath 02/28/2016  . Lupus anticoagulant positive 02/21/2016  . Weight loss 02/09/2016    Past Surgical History:  Procedure Laterality Date  . COLONOSCOPY WITH PROPOFOL N/A 06/02/2016   Procedure: COLONOSCOPY WITH PROPOFOL;  Surgeon: [name], MD;  Location: [facility] ENDOSCOPY;  Service: Gastroenterology;  Laterality: N/A;  . CYST EXCISION Left    hand  . ESOPHAGOGASTRODUODENOSCOPY (EGD) WITH PROPOFOL N/A 06/02/2016   Procedure: ESOPHAGOGASTRODUODENOSCOPY (EGD) WITH PROPOFOL;  Surgeon: [name], MD;  Location: [facility] ENDOSCOPY;  Service: Gastroenterology;  Laterality: N/A;  . FLEXIBLE BRONCHOSCOPY N/A 04/17/2016   Procedure: FLEXIBLE BRONCHOSCOPY;  Surgeon:  [name], MD;  Location: [facility] ORS;  Service: Thoracic;  Laterality: N/A;  . HAND SURGERY Left   . left upper lobectomy Left 03/2016  . LUNG REMOVAL, PARTIAL Left    Left lower lobe  . THORACOTOMY Left 04/17/2016   Procedure: THORACOTOMY LEFT UPPER LOBE LUNG RESECTION;  Surgeon: [name], MD;  Location: [facility] ORS;  Service: Thoracic;  Laterality: Left;  . TUBAL LIGATION Bilateral    37 years ago    OB History    No data available       Home Medications    Prior to Admission medications   Medication Sig Start Date End Date Taking? Authorizing Provider  albuterol (PROVENTIL HFA;VENTOLIN HFA) 108 (90 Base) MCG/ACT inhaler Inhale 2 puffs into the lungs every 6 (six) hours as needed for wheezing or shortness of breath. 10/16/16   Flora Lipps, MD  albuterol (PROVENTIL HFA;VENTOLIN HFA) 108 (90 Base) MCG/ACT inhaler Inhale 2 puffs into the lungs every 4 (four) hours as needed for wheezing or shortness of breath. Patient not taking: Reported on 12/12/2016 11/21/16   Flora Lipps, MD  albuterol (PROVENTIL HFA;VENTOLIN HFA) 108 (90 Base) MCG/ACT inhaler Inhale into the lungs. 11/21/16   [provider]  ALPRAZolam Duanne Moron) 0.25 MG tablet Take 1 tablet (0.25 mg total) by mouth 3 (three) times daily as needed for anxiety. 11/19/16 11/19/17  Nance Pear, MD  ALPRAZolam Duanne Moron) 0.25 MG tablet Take by mouth. 11/19/16 11/19/17  [provider]  gabapentin (NEURONTIN) 100 MG capsule Take 1 capsule (100  mg total) by mouth 3 (three) times daily. Patient taking differently: Take 100 mg by mouth at bedtime.  07/26/16   [name], MD  gabapentin (NEURONTIN) 100 MG capsule Take by mouth. 07/26/16   [provider]  linaclotide (LINZESS) 145 MCG CAPS capsule TAKE 1 CAPSULE (145 MCG TOTAL) BY MOUTH DAILY BEFORE BREAKFAST. 11/20/16   [name], MD  meloxicam (MOBIC) 7.5 MG tablet daily.  08/28/16   [provider]  omeprazole (PRILOSEC) 40 MG capsule Take 1 capsule (40  mg total) by mouth daily. 11/20/16 12/20/16  [name], MD  ondansetron (ZOFRAN) 4 MG tablet Take 4 mg by mouth as needed.  05/10/16   [provider]  ranitidine (ZANTAC) 150 MG tablet Take 150 mg by mouth daily.  06/05/16   [provider]  sucralfate (CARAFATE) 1 g tablet  11/22/16   [provider]  sucralfate (CARAFATE) 1 GM/10ML suspension Take 10 mLs (1 g total) by mouth 4 (four) times daily. 11/20/16   [name], MD  traMADol (ULTRAM) 50 MG tablet Take 50 mg by mouth every 6 (six) hours as needed for moderate pain.    [provider]  umeclidinium-vilanterol (ANORO ELLIPTA) 62.5-25 MCG/INH AEPB Inhale 1 puff into the lungs daily. 11/15/16   Flora Lipps, MD  umeclidinium-vilanterol (ANORO ELLIPTA) 62.5-25 MCG/INH AEPB Inhale 1 puff into the lungs daily. Patient not taking: Reported on 12/12/2016 11/21/16   Flora Lipps, MD  umeclidinium-vilanterol Jearl Klinefelter ELLIPTA) 62.5-25 MCG/INH AEPB Take by mouth. 11/19/16   [provider]    Family History Family History  Problem Relation Age of Onset  . Pancreatic cancer Sister   . Lung cancer Sister   . Lung cancer Brother   . Stomach cancer Paternal Grandmother   . Heart disease Father   . Cancer Daughter 7       cervical  . Cancer Maternal Aunt        brain  . Cancer Maternal Uncle        prostate  . Cancer Maternal Aunt        bone    Social History Social History  Substance Use Topics  . Smoking status: Former Smoker    Packs/day: 0.25    Years: 35.00    Types: Cigarettes    Quit date: 03/19/2016  . Smokeless tobacco: Former Systems developer    Quit date: 03/16/2016  . Alcohol use 0.6 oz/week    1 Cans of beer per week     Comment: Rarely     Allergies   Patient has no known allergies.   Review of Systems Review of Systems  Constitutional: Negative for fever.  Musculoskeletal: Positive for arthralgias and myalgias. Negative for gait problem.  Skin: Negative for rash and wound.    Neurological: Negative for weakness and numbness.     Physical Exam Updated Vital Signs BP 123/67 (BP Location: Left Arm)   Pulse (!) 58   Temp 98.8 F (37.1 C) (Oral)   Resp 16   Ht '5\' 5"'$  (1.651 m)   Wt 121 lb (54.9 kg)   SpO2 100%   BMI 20.14 kg/m   Physical Exam  Constitutional: She is oriented to person, place, and time. She appears well-developed and well-nourished. No distress.  HENT:  Head: Normocephalic and atraumatic.  Mouth/Throat: Oropharynx is clear and moist.  Eyes: EOM are normal. Pupils are equal, round, and reactive to light. Right eye exhibits no discharge. Left eye exhibits no discharge.  Neck: Normal range of  motion. Neck supple.  Cardiovascular: Normal rate and intact distal pulses.   Pulmonary/Chest: Effort normal. No respiratory distress.  Musculoskeletal: Normal range of motion. She exhibits no edema.  Examination of the left hand shows patient has close to full composite fist. There is swelling on the third MCP joint into the PIP joint of the left hand. No tendon deficits noted. No skin breakdown noted. There is no warmth or redness but there is moderate swelling.  Neurological: She is alert and oriented to person, place, and time. She has normal reflexes.  Skin: Skin is warm and dry.  Psychiatric: She has a normal mood and affect. Her behavior is normal. Thought content normal.     ED Treatments / Results  Labs (all labs ordered are listed, but only abnormal results are displayed) Labs Reviewed - No data to display  EKG  EKG Interpretation None       Radiology Dg Hand Complete Left  Result Date: 12/17/2016 CLINICAL DATA:  Left hand pain since getting caught hand duct Haller 2 days ago. Swelling in the second, third, and fourth digits. EXAM: LEFT HAND - COMPLETE 3+ VIEW COMPARISON:  None available. FINDINGS: Soft tissue swelling is most evident in the proximal middle finger. There is no underlying fracture or dislocation. No radiopaque  foreign body is present. IMPRESSION: 1. Soft tissue swelling proximally within the long finger without underlying fracture or dislocation Electronically Signed   By: San Morelle M.D.   On: 12/17/2016 14:38    Procedures Procedures (including critical care time)  Medications Ordered in ED Medications - No data to display   Initial Impression / Assessment and Plan / ED Course  I have reviewed the triage vital signs and the nursing notes.  Pertinent labs & imaging results that were available during my care of the patient were reviewed by me and considered in my medical decision making (see chart for details).     61 year old female with swelling to left third MCP joint from trauma. There is no tendon deficits noted. All swelling is present. No evidence of acute bony mallet. She'll take Tylenol as needed for pain. She'll rest ice and elevate. Fingers are buddy taped, neurovascular intact.   Final Clinical Impressions(s) / ED Diagnoses   Final diagnoses:  Contusion of left hand, initial encounter    New Prescriptions New Prescriptions   No medications on file     Renata Caprice 12/17/16 1527    Nena Polio, MD 12/19/16 1725

## 2016-12-19 LAB — TOXASSURE SELECT 13 (MW), URINE

## 2016-12-27 ENCOUNTER — Ambulatory Visit
Admission: RE | Admit: 2016-12-27 | Discharge: 2016-12-27 | Disposition: A | Discharge status: Home / Self Care | Payer: BLUE CROSS/BLUE SHIELD | Source: Ambulatory Visit | Attending: Anesthesiology | Admitting: Anesthesiology

## 2016-12-27 ENCOUNTER — Ambulatory Visit (HOSPITAL_BASED_OUTPATIENT_CLINIC_OR_DEPARTMENT_OTHER): Payer: BLUE CROSS/BLUE SHIELD | Admitting: Anesthesiology

## 2016-12-27 ENCOUNTER — Encounter: Payer: Self-pay | Admitting: Anesthesiology

## 2016-12-27 ENCOUNTER — Other Ambulatory Visit: Payer: Self-pay | Admitting: Anesthesiology

## 2016-12-27 VITALS — BP 109/65 | HR 66 | Temp 97.3°F | Resp 16 | Ht 65.0 in | Wt 121.0 lb

## 2016-12-27 DIAGNOSIS — G588 Other specified mononeuropathies: Secondary | ICD-10-CM

## 2016-12-27 DIAGNOSIS — G8912 Acute post-thoracotomy pain: Secondary | ICD-10-CM

## 2016-12-27 DIAGNOSIS — R52 Pain, unspecified: Secondary | ICD-10-CM | POA: Diagnosis not present

## 2016-12-27 MED ORDER — LACTATED RINGERS IV SOLN
1000.0000 mL | INTRAVENOUS | Status: DC
  Administered 2016-12-27: 1000 mL via INTRAVENOUS

## 2016-12-27 MED ORDER — ROPIVACAINE HCL 2 MG/ML IJ SOLN
10.0000 mL | Freq: Once | INTRAMUSCULAR | Status: AC
  Administered 2016-12-27: 10 mL via EPIDURAL
  Filled 2016-12-27: qty 10

## 2016-12-27 MED ORDER — MIDAZOLAM HCL 5 MG/5ML IJ SOLN
INTRAMUSCULAR | Status: AC
  Filled 2016-12-27: qty 5

## 2016-12-27 MED ORDER — MIDAZOLAM HCL 2 MG/2ML IJ SOLN
5.0000 mg | Freq: Once | INTRAMUSCULAR | Status: AC
  Administered 2016-12-27: 3 mg via INTRAVENOUS

## 2016-12-27 MED ORDER — TRIAMCINOLONE ACETONIDE 40 MG/ML IJ SUSP
40.0000 mg | Freq: Once | INTRAMUSCULAR | Status: AC
  Administered 2016-12-27: 40 mg
  Filled 2016-12-27: qty 1

## 2016-12-27 NOTE — Progress Notes (Signed)
Safety precautions to be maintained throughout the outpatient stay will include: orient to surroundings, keep bed in low position, maintain call bell within reach at all times, provide assistance with transfer out of bed and ambulation.  

## 2016-12-27 NOTE — Patient Instructions (Signed)
Pain Management Discharge Instructions  General Discharge Instructions :  If you need to reach your doctor call: Monday-Friday 8:00 am - 4:00 pm at [phone] or toll free [phone].  After clinic hours [phone] to have operator reach doctor.  Bring all of your medication bottles to all your appointments in the pain clinic.  To cancel or reschedule your appointment with Pain Management please remember to call 24 hours in advance to avoid a fee.  Refer to the educational materials which you have been given on: General Risks, I had my Procedure. Discharge Instructions, Post Sedation.  Post Procedure Instructions:  The drugs you were given will stay in your system until tomorrow, so for the next 24 hours you should not drive, make any legal decisions or drink any alcoholic beverages.  You may eat anything you prefer, but it is better to start with liquids then soups and crackers, and gradually work up to solid foods.  Please notify your doctor immediately if you have any unusual bleeding, trouble breathing or pain that is not related to your normal pain.  Depending on the type of procedure that was done, some parts of your body may feel week and/or numb.  This usually clears up by tonight or the next day.  Walk with the use of an assistive device or accompanied by an adult for the 24 hours.  You may use ice on the affected area for the first 24 hours.  Put ice in a Ziploc bag and cover with a towel and place against area 15 minutes on 15 minutes off.  You may switch to heat after 24 hours.Intercostal Nerve Block Patient Information  Description: The twelve intercostal nerves arise from the first thru twelfth thoracic nerve roots.  The nerve begins at the spine and wraps around the body, lying in a groove underneath each rib.  Each intercostal nerve innervates a specific strip of skin and body walk of the abdomen and chest.  Therefore, injuries of the chest wall or abdominal wall  result in pain that is transmitted back to the brian via the intercostal nerves.  Examples of such injuries include rib fractures and incisions for lung and gall bladder surgery.  Occasionally, pain may persist long after an injury or surgical incision secondary to inflammation and irritation of the intercostal nerve.  The longstanding pain is known as intercostal neuralgia.  An intercostal nerve block is preformed to eliminate pain either temporarily or permanently.  A small needle is placed below the rib and local anesthetic (like Novocaine) and possibly steroid is injected.  Usually 2-4 intercostal nerves are blocked at a time depending on the problem.  The patient will experience a slight "pin-prick" sensation for each injection.  Shortly thereafter, the strip of skin that is innervated by the blocked intercostal nerve will feel numb.  Persistent pain that is only temporarily relieved with local anesthetic may require a more permanent block. This procedure is called Cryoneurolysis and entails placing a small probe beneath the rib to freeze the nerve.  Conditions that may be treated by intercostal nerve blocks:   Rib fractures  Longstanding pain from surgery of the chest or abdomen (intercostal neuralgia)  Pain from chest tubes  Pain from trauma to the chest  Preparation for the injections:  1. Do not eat any solid food or dairy products within 8 hours of your appointment. 2. You may drink clear liquids up to 3 hours before appointment.  Clear liquids include water, black coffee, juice or soda.  No milk or cream please. 3. You may take your regular medication, including pain medications, with a sip of water before your appointment.  Diabetics should hold regular insulin (if take separately) and take 1/2 normal NPH dose the morning of the procedure.   Carry some sugar containing items with you to your appointment. 4. A driver must accompany you and be prepared to drive you home after your  procedure. 5. Bring all your current medications with you. 6. An IV may be inserted and sedation may be given at the discretion of the physician. 7. A blood pressure cuff, EKG and other monitors will often be applied during the procedure.  Some patients may need to have extra oxygen administered for a short period. 8. You will be asked to provide medical information, including your allergies, prior to the procedure.  We must know immediately if you are taking blood thinners (like Coumadin/Warfarin) or if you are allergic to IV iodine contrast (dye). We must know if you could possible be pregnant.  Possible side-effects:   Bleeding from needle site  Infection (rare)  Nerve injury (rare)  Numbness & tingling of skin  Collapsed lung requiring chest tube (rare)  Local anesthetic toxicity (rare)  Light-headedness (temporary)  Pain at injection site (several days)  Decreased blood pressure (temporary)  Shortness of breath  Jittery/shaking sensation (temporary)  Call if you experience:   Difficulty breathing or hives (go directly to the emergency room)  Redness, inflammation or drainage at the injection site  Severe pain at the site of the injection  Any new symptoms which are concerning   Please note:  Your pain may subside immediately but may return several hours after the injection.  Often, more than one injection is required to reduce the pain. Also, if several temporary blocks with local anesthetic are ineffective, a more permanent block with cryolysis may be necessary.  This will be discussed with you should this be the case.  If you have any questions, please call [phone] [place]  What are the risk, side effects and possible complications? Generally speaking, most procedures are safe.  However, with any procedure there are risks, side effects, and the possibility of complications.  The  risks and complications are dependent upon the sites that are lesioned, or the type of nerve block to be performed.  The closer the procedure is to the spine, the more serious the risks are.  Great care is taken when placing the radio frequency needles, block needles or lesioning probes, but sometimes complications can occur. 1. Infection: Any time there is an injection through the skin, there is a risk of infection.  This is why sterile conditions are used for these blocks.  There are four possible types of infection. 1. Localized skin infection. 2. Central Nervous System Infection-This can be in the form of Meningitis, which can be deadly. 3. Epidural Infections-This can be in the form of an epidural abscess, which can cause pressure inside of the spine, causing compression of the spinal cord with subsequent paralysis. This would require an emergency surgery to decompress, and there are no guarantees that the patient would recover from the paralysis. 4. Discitis-This is an infection of the intervertebral discs.  It occurs in about 1% of discography procedures.  It is difficult to treat and it may lead to surgery.        2. Pain: the needles have to go through skin  and soft tissues, will cause soreness.       3. Damage to internal structures:  The nerves to be lesioned may be near blood vessels or    other nerves which can be potentially damaged.       4. Bleeding: Bleeding is more common if the patient is taking blood thinners such as  aspirin, Coumadin, Ticiid, Plavix, etc., or if he/she have some genetic predisposition  such as hemophilia. Bleeding into the spinal canal can cause compression of the spinal  cord with subsequent paralysis.  This would require an emergency surgery to  decompress and there are no guarantees that the patient would recover from the  paralysis.       5. Pneumothorax:  Puncturing of a lung is a possibility, every time a needle is introduced in  the area of the chest or upper  back.  Pneumothorax refers to free air around the  collapsed lung(s), inside of the thoracic cavity (chest cavity).  Another two possible  complications related to a similar event would include: Hemothorax and Chylothorax.   These are variations of the Pneumothorax, where instead of air around the collapsed  lung(s), you may have blood or chyle, respectively.       6. Spinal headaches: They may occur with any procedures in the area of the spine.       7. Persistent CSF (Cerebro-Spinal Fluid) leakage: This is a rare problem, but may occur  with prolonged intrathecal or epidural catheters either due to the formation of a fistulous  track or a dural tear.       8. Nerve damage: By working so close to the spinal cord, there is always a possibility of  nerve damage, which could be as serious as a permanent spinal cord injury with  paralysis.       9. Death:  Although rare, severe deadly allergic reactions known as "Anaphylactic  reaction" can occur to any of the medications used.      10. Worsening of the symptoms:  We can always make thing worse.  What are the chances of something like this happening? Chances of any of this occuring are extremely low.  By statistics, you have more of a chance of getting killed in a motor vehicle accident: while driving to the hospital than any of the above occurring .  Nevertheless, you should be aware that they are possibilities.  In general, it is similar to taking a shower.  Everybody knows that you can slip, hit your head and get killed.  Does that mean that you should not shower again?  Nevertheless always keep in mind that statistics do not mean anything if you happen to be on the wrong side of them.  Even if a procedure has a 1 (one) in a 1,000,000 (million) chance of going wrong, it you happen to be that one..Also, keep in mind that by statistics, you have more of a chance of having something go wrong when taking medications.  Who should not have this procedure? If  you are on a blood thinning medication (e.g. Coumadin, Plavix, see list of "Blood Thinners"), or if you have an active infection going on, you should not have the procedure.  If you are taking any blood thinners, please inform your physician.  How should I prepare for this procedure?  Do not eat or drink anything at least six hours prior to the procedure.  Bring a driver with you .  It cannot be a taxi.  Come accompanied by an adult that can drive you back, and that is strong enough to help you if your legs get weak or numb from the local anesthetic.  Take all of your medicines the morning of the procedure with just enough water to swallow them.  If you have diabetes, make sure that you are scheduled to have your procedure done first thing in the morning, whenever possible.  If you have diabetes, take only half of your insulin dose and notify our nurse that you have done so as soon as you arrive at the clinic.  If you are diabetic, but only take blood sugar pills (oral hypoglycemic), then do not take them on the morning of your procedure.  You may take them after you have had the procedure.  Do not take aspirin or any aspirin-containing medications, at least eleven (11) days prior to the procedure.  They may prolong bleeding.  Wear loose fitting clothing that may be easy to take off and that you would not mind if it got stained with Betadine or blood.  Do not wear any jewelry or perfume  Remove any nail coloring.  It will interfere with some of our monitoring equipment.  NOTE: Remember that this is not meant to be interpreted as a complete list of all possible complications.  Unforeseen problems may occur.  BLOOD THINNERS The following drugs contain aspirin or other products, which can cause increased bleeding during surgery and should not be taken for 2 weeks prior to and 1 week after surgery.  If you should need take something for relief of minor pain, you may take acetaminophen which  is found in Tylenol,m Datril, Anacin-3 and Panadol. It is not blood thinner. The products listed below are.  Do not take any of the products listed below in addition to any listed on your instruction sheet.  A.P.C or A.P.C with Codeine Codeine Phosphate Capsules #3 Ibuprofen Ridaura  ABC compound Congesprin Imuran rimadil  Advil Cope Indocin Robaxisal  Alka-Seltzer Effervescent Pain Reliever and Antacid Coricidin or Coricidin-D  Indomethacin Rufen  Alka-Seltzer plus Cold Medicine Cosprin Ketoprofen S-A-C Tablets  Anacin Analgesic Tablets or Capsules Coumadin Korlgesic Salflex  Anacin Extra Strength Analgesic tablets or capsules CP-2 Tablets Lanoril Salicylate  Anaprox Cuprimine Capsules Levenox Salocol  Anexsia-D Dalteparin Magan Salsalate  Anodynos Darvon compound Magnesium Salicylate Sine-off  Ansaid Dasin Capsules Magsal Sodium Salicylate  Anturane Depen Capsules Marnal Soma  APF Arthritis pain formula Dewitt's Pills Measurin Stanback  Argesic Dia-Gesic Meclofenamic Sulfinpyrazone  Arthritis Bayer Timed Release Aspirin Diclofenac Meclomen Sulindac  Arthritis pain formula Anacin Dicumarol Medipren Supac  Analgesic (Safety coated) Arthralgen Diffunasal Mefanamic Suprofen  Arthritis Strength Bufferin Dihydrocodeine Mepro Compound Suprol  Arthropan liquid Dopirydamole Methcarbomol with Aspirin Synalgos  ASA tablets/Enseals Disalcid Micrainin Tagament  Ascriptin Doan's Midol Talwin  Ascriptin A/D Dolene Mobidin Tanderil  Ascriptin Extra Strength Dolobid Moblgesic Ticlid  Ascriptin with Codeine Doloprin or Doloprin with Codeine Momentum Tolectin  Asperbuf Duoprin Mono-gesic Trendar  Aspergum Duradyne Motrin or Motrin IB Triminicin  Aspirin plain, buffered or enteric coated Durasal Myochrisine Trigesic  Aspirin Suppositories Easprin Nalfon Trillsate  Aspirin with Codeine Ecotrin Regular or Extra Strength Naprosyn Uracel  Atromid-S Efficin Naproxen Ursinus  Auranofin Capsules Elmiron  Neocylate Vanquish  Axotal Emagrin Norgesic Verin  Azathioprine Empirin or Empirin with Codeine Normiflo Vitamin E  Azolid Emprazil Nuprin Voltaren  Bayer Aspirin plain, buffered or children's or timed BC Tablets or powders Encaprin Orgaran Warfarin Sodium  Buff-a-Comp Enoxaparin Orudis Zorpin  Buff-a-Comp with Codeine Equegesic Os-Cal-Gesic   Buffaprin Excedrin plain, buffered or Extra Strength Oxalid   Bufferin Arthritis Strength Feldene Oxphenbutazone   Bufferin plain or Extra Strength Feldene Capsules Oxycodone with Aspirin   Bufferin with Codeine Fenoprofen Fenoprofen Pabalate or Pabalate-SF   Buffets II Flogesic Panagesic   Buffinol plain or Extra Strength Florinal or Florinal with Codeine Panwarfarin   Buf-Tabs Flurbiprofen Penicillamine   Butalbital Compound Four-way cold tablets Penicillin   Butazolidin Fragmin Pepto-Bismol   Carbenicillin Geminisyn Percodan   Carna Arthritis Reliever Geopen Persantine   Carprofen Gold's salt Persistin   Chloramphenicol Goody's Phenylbutazone   Chloromycetin Haltrain Piroxlcam   Clmetidine heparin Plaquenil   Cllnoril Hyco-pap Ponstel   Clofibrate Hydroxy chloroquine Propoxyphen         Before stopping any of these medications, be sure to consult the physician who ordered them.  Some, such as Coumadin (Warfarin) are ordered to prevent or treat serious conditions such as "deep thrombosis", "pumonary embolisms", and other heart problems.  The amount of time that you may need off of the medication may also vary with the medication and the reason for which you were taking it.  If you are taking any of these medications, please make sure you notify your pain physician before you undergo any procedures.

## 2016-12-27 NOTE — Progress Notes (Signed)
Subjective:  Patient ID: Sheila Suarez, female    DOB: 03-Aug-1955  Age: 61 y.o. MRN: [id number]  CC: Breast Pain (under left breast)  Service Provided on Last Visit: Procedure (left ICNB) PROCEDURE:#2 Left posterior thoracic intercostal nerve block under fluoroscopic guidance with moderate sedation at T6 T7 T8  HPI Sheila Suarez presents for Reevaluation. She was last seen 2 weeks ago and had her first intercostal nerve blocks on the left side. She states that her left side back pain is significantly diminished at approximately 50-75%. She still having significant left side anterior wall pain under the left breast as before. The pain has not changed significantly. The quality characteristic and distribution of the pain are otherwise stable. She desires to proceed with a repeat injection today.   Originally and By history She is a pleasant 61 year old female that has had a recent thoracotomy for a lobe resection with Dr. Genevive Bi. She is reporting some persistent pain in a dermatomal distribution left side in the mid axillary line and below the posterior left scapula. The pain quality is cramping with associated itching and nagging continuous symptoms. Occasionally the pain as stabbing but is generally uncomfortable with a pain score of 5 out of 10. It does not appear to be influenced by time of day and has persisted since her surgery in September 2017. No associated shortness of breath or chest pain or difficulty with inspiration or expiration are noted. She has some cramping pain at night and despite medication management the pain persists. She reports having had a thorough postoperative workup with Dr. Faith Rogue which was otherwise unremarkable. Postoperative associated problems.  History Sheila Suarez has a past medical history of Cancer ([place]); Coronary artery disease; Emphysema of lung ([place]); GERD (gastroesophageal reflux disease); Lupus; and Shortness of breath dyspnea.   She has a past surgical  history that includes Tubal ligation (Bilateral); Cyst excision (Left); Flexible bronchoscopy (N/A, 04/17/2016); Thoracotomy (Left, 04/17/2016); Colonoscopy with propofol (N/A, 06/02/2016); Esophagogastroduodenoscopy (egd) with propofol (N/A, 06/02/2016); Lung removal, partial (Left); left upper lobectomy (Left, 03/2016); and Hand surgery (Left).   Her family history includes Cancer in her maternal aunt, maternal aunt, and maternal uncle; Cancer (age of onset: 18) in her daughter; Heart disease in her father; Lung cancer in her brother and sister; Pancreatic cancer in her sister; Stomach cancer in her paternal grandmother.She reports that she quit smoking about 9 months ago. Her smoking use included Cigarettes. She has a 8.75 pack-year smoking history. She quit smokeless tobacco use about 9 months ago. She reports that she drinks about 0.6 oz of alcohol per week . She reports that she does not use drugs.  No results found for this or any previous visit.  No results found for: TOXASSSELUR  Outpatient Medications Prior to Visit  Medication Sig Dispense Refill  . albuterol (PROVENTIL HFA;VENTOLIN HFA) 108 (90 Base) MCG/ACT inhaler Inhale 2 puffs into the lungs every 6 (six) hours as needed for wheezing or shortness of breath. 1 Inhaler 2  . albuterol (PROVENTIL HFA;VENTOLIN HFA) 108 (90 Base) MCG/ACT inhaler Inhale 2 puffs into the lungs every 4 (four) hours as needed for wheezing or shortness of breath. 1 Inhaler 6  . albuterol (PROVENTIL HFA;VENTOLIN HFA) 108 (90 Base) MCG/ACT inhaler Inhale into the lungs.    . ALPRAZolam (XANAX) 0.25 MG tablet Take 1 tablet (0.25 mg total) by mouth 3 (three) times daily as needed for anxiety. 10 tablet 0  . ALPRAZolam (XANAX) 0.25 MG tablet Take by mouth.    Marland Kitchen  gabapentin (NEURONTIN) 100 MG capsule Take 1 capsule (100 mg total) by mouth 3 (three) times daily. (Patient taking differently: Take 100 mg by mouth at bedtime. ) 90 capsule 0  . gabapentin (NEURONTIN) 100 MG  capsule Take by mouth.    . linaclotide (LINZESS) 145 MCG CAPS capsule TAKE 1 CAPSULE (145 MCG TOTAL) BY MOUTH DAILY BEFORE BREAKFAST. 30 capsule 3  . meloxicam (MOBIC) 7.5 MG tablet daily.     . ondansetron (ZOFRAN) 4 MG tablet Take 4 mg by mouth as needed.     . ranitidine (ZANTAC) 150 MG tablet Take 150 mg by mouth daily.     . sucralfate (CARAFATE) 1 g tablet   0  . sucralfate (CARAFATE) 1 GM/10ML suspension Take 10 mLs (1 g total) by mouth 4 (four) times daily. 420 mL 1  . traMADol (ULTRAM) 50 MG tablet Take 50 mg by mouth every 6 (six) hours as needed for moderate pain.    Marland Kitchen umeclidinium-vilanterol (ANORO ELLIPTA) 62.5-25 MCG/INH AEPB Inhale 1 puff into the lungs daily. 60 each 4  . umeclidinium-vilanterol (ANORO ELLIPTA) 62.5-25 MCG/INH AEPB Inhale 1 puff into the lungs daily. 60 each 3  . umeclidinium-vilanterol (ANORO ELLIPTA) 62.5-25 MCG/INH AEPB Take by mouth.    Marland Kitchen omeprazole (PRILOSEC) 40 MG capsule Take 1 capsule (40 mg total) by mouth daily. 30 capsule 3   No facility-administered medications prior to visit.    Lab Results  Component Value Date   WBC 8.9 11/19/2016   HGB 12.8 11/19/2016   HCT 39.0 11/19/2016   PLT 420 11/19/2016   GLUCOSE 82 11/19/2016   ALT 11 (L) 11/19/2016   AST 16 11/19/2016   NA 137 11/19/2016   K 3.7 11/19/2016   CL 105 11/19/2016   CREATININE 0.69 11/19/2016   BUN 9 11/19/2016   CO2 25 11/19/2016   TSH 0.635 02/09/2016   INR 0.91 03/28/2016    --------------------------------------------------------------------------------------------------------------------- Ct Angio Neck W Or Wo Contrast  Result Date: 06/06/2016 CLINICAL DATA:  Initial evaluation for acute onset severe anterior neck pain. EXAM: CT ANGIOGRAPHY NECK TECHNIQUE: Multidetector CT imaging of the neck was performed using the standard protocol during bolus administration of intravenous contrast. Multiplanar CT image reconstructions and MIPs were obtained to evaluate the vascular  anatomy. Carotid stenosis measurements (when applicable) are obtained utilizing NASCET criteria, using the distal internal carotid diameter as the denominator. CONTRAST:  100 cc of Isovue 370. COMPARISON:  None available. FINDINGS: Aortic arch: Examination is somewhat technically limited due to timing of the contrast bolus. Aortic arch of normal caliber with normal 3 vessel morphology. Scattered plaque about the origin of the great vessels without high-grade stenosis. Visualized subclavian arteries are widely patent. Right carotid system: Right common carotid artery patent from its origin to the bifurcation without stenosis or acute abnormality. No significant atheromatous disease about the right carotid bifurcation. Right ICA patent from the bifurcation to the skullbase without dissection, stenosis or occlusion. Right external carotid artery and its branches within normal limits. Left carotid system: Left common carotid artery patent from its origin to the bifurcation without stenosis or definite acute abnormality. Evaluation mildly limited by streak artifact from prominent contrast within the adjacent left internal jugular vein. Mild a centric plaque about the left bifurcation without significant stenosis. Left ICA patent distally to the skullbase without stenosis, dissection, or occlusion. Left external carotid artery and its branches within normal limits. Vertebral arteries: Both of the vertebral arteries arise from the subclavian arteries. Left vertebral artery not  well evaluated proximally due to adjacent venous contrast. Proximal pre foraminal right V1 segment within normal limits. Vertebral arteries patent distally without stenosis, dissection, or occlusion. Distal left P2/P3 segments not well evaluated due to adjacent venous contrast as well. Visualized basilar artery widely patent. Skeleton: No acute osseous abnormality. No worrisome lytic or blastic osseous lesions. Other neck: Visualized soft tissues of  the neck demonstrate no acute abnormality. Please note evaluation somewhat limited by streak artifact from IV contrast within multiple venous collaterals within the neck. No definite adenopathy. Thyroid grossly unremarkable. Upper chest: Visualized upper chest demonstrates no definite acute abnormality. This is better evaluated on concomitant CT of the chest orbits same time. Emphysema noted. IMPRESSION: 1. Negative CTA of the neck. No acute vascular abnormality identified within the major arterial vasculature of the neck. No high-grade or critical stenosis. 2. Mild atheromatous plaque about the left carotid bifurcation without stenosis. 3. Emphysema. Electronically Signed   By: Jeannine Boga M.D.   On: 06/06/2016 21:42   Dg Chest Portable 1 View  Result Date: 06/06/2016 CLINICAL DATA:  Upper central chest pain since 1600 hours today. Pain is worse with inspiration. EXAM: PORTABLE CHEST 1 VIEW COMPARISON:  05/26/2016 FINDINGS: Volume loss of the left lung status post reported left lower lobectomy with chain sutures noted about the left hilum as before. There is hyperinflation of right lung. The cardiac and mediastinal contours are stable in appearance with atherosclerosis of the aortic arch. The lungs are free of pneumonic consolidations. Pulmonary vasculature is unremarkable. No effusion or pneumothorax. No suspicious osseous abnormalities. IMPRESSION: Stable postoperative appearance of the chest with volume loss on the left. Compensatory hyperinflation of the right. Electronically Signed   By: Ashley Royalty M.D.   On: 06/06/2016 20:28   Ct Angio Chest Aorta W And/or Wo Contrast  Result Date: 06/06/2016 CLINICAL DATA:  Neck pain and history of prior left upper lobectomy for carcinoma EXAM: CT ANGIOGRAPHY CHEST WITH CONTRAST TECHNIQUE: Multidetector CT imaging of the chest was performed using the standard protocol during bolus administration of intravenous contrast. Multiplanar CT image reconstructions  and MIPs were obtained to evaluate the vascular anatomy. CONTRAST:  100 mL Isovue 370. COMPARISON:  02/07/2016 FINDINGS: Cardiovascular: Thoracic aorta is within normal limits. No evidence of aneurysmal dilatation or dissection is noted. The pulmonary artery shows a normal branching pattern without evidence of filling defect. Mild coronary calcifications are seen. Mediastinum/Nodes: The thoracic inlet is within normal limits. Considerable collateralization of venous flow is noted related to a focal area of narrowing within the left innominate vein best seen on image number 25 of series 6. No significant hilar or mediastinal adenopathy is identified. Lungs/Pleura: Postsurgical changes are noted consistent with the left upper lobectomy. The right lung is well aerated without focal infiltrate or sizable effusion. Very mild emphysematous changes are seen. The residual left lung is within normal limits. No evidence of recurrence is seen. Upper Abdomen: Hypodensity is again noted within the right lobe of the liver stable from prior exam dated 02/07/2016 consistent with hemangioma. The remainder the upper abdomen is within normal limits. Musculoskeletal: No acute bony abnormality is noted. Review of the MIP images confirms the above findings. IMPRESSION: Status post left upper lobectomy. 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 No evidence of pulmonary emboli.  No aortic dissection is seen. Stable hepatic hemangioma. Electronically Signed   By: Inez Catalina M.D.   On: 06/06/2016 21:27       ----------------------------------------------------------------------------------------------------------------------  Past Medical History:  Diagnosis Date  . Cancer ([place])   . Coronary artery disease   . Emphysema of lung ([place])   . GERD (gastroesophageal reflux disease)   . Lupus   . Shortness of breath dyspnea     Past Surgical History:   Procedure Laterality Date  . COLONOSCOPY WITH PROPOFOL N/A 06/02/2016   Procedure: COLONOSCOPY WITH PROPOFOL;  Surgeon: [name], MD;  Location: [facility] ENDOSCOPY;  Service: Gastroenterology;  Laterality: N/A;  . CYST EXCISION Left    hand  . ESOPHAGOGASTRODUODENOSCOPY (EGD) WITH PROPOFOL N/A 06/02/2016   Procedure: ESOPHAGOGASTRODUODENOSCOPY (EGD) WITH PROPOFOL;  Surgeon: [name], MD;  Location: [facility] ENDOSCOPY;  Service: Gastroenterology;  Laterality: N/A;  . FLEXIBLE BRONCHOSCOPY N/A 04/17/2016   Procedure: FLEXIBLE BRONCHOSCOPY;  Surgeon: [name], MD;  Location: [facility] ORS;  Service: Thoracic;  Laterality: N/A;  . HAND SURGERY Left   . left upper lobectomy Left 03/2016  . LUNG REMOVAL, PARTIAL Left    Left lower lobe  . THORACOTOMY Left 04/17/2016   Procedure: THORACOTOMY LEFT UPPER LOBE LUNG RESECTION;  Surgeon: [name], MD;  Location: [facility] ORS;  Service: Thoracic;  Laterality: Left;  . TUBAL LIGATION Bilateral    37 years ago    Family History  Problem Relation Age of Onset  . Pancreatic cancer Sister   . Lung cancer Sister   . Lung cancer Brother   . Stomach cancer Paternal Grandmother   . Heart disease Father   . Cancer Daughter 67       cervical  . Cancer Maternal Aunt        brain  . Cancer Maternal Uncle        prostate  . Cancer Maternal Aunt        bone    Social History  Substance Use Topics  . Smoking status: Former Smoker    Packs/day: 0.25    Years: 35.00    Types: Cigarettes    Quit date: 03/19/2016  . Smokeless tobacco: Former Systems developer    Quit date: 03/16/2016  . Alcohol use 0.6 oz/week    1 Cans of beer per week     Comment: Rarely    ---------------------------------------------------------------------------------------------------------------------   BP 111/77   Pulse 66   Temp 98.2 F (36.8 C) (Oral)   Resp 15   Ht 5\' 5"  (1.651 m)   Wt 121 lb (54.9 kg)   SpO2 100%   BMI 20.14 kg/m    BP Readings from Last 3 Encounters:   12/27/16 111/77  12/17/16 120/80  12/12/16 (!) 141/90     Wt Readings from Last 3 Encounters:  12/27/16 121 lb (54.9 kg)  12/17/16 121 lb (54.9 kg)  12/12/16 121 lb (54.9 kg)     ----------------------------------------------------------------------------------------------------------------------  ROS Review of Systems  Cardiac: No chest pain Pulmonary: No shortness of breath or dyspnea Psychologic: No history of drug abuse or suicidal ideation or anxiety No changes noted  Objective:  BP 111/77   Pulse 66   Temp 98.2 F (36.8 C) (Oral)   Resp 15   Ht 5\' 5"  (1.651 m)   Wt 121 lb (54.9 kg)   SpO2 100%   BMI 20.14 kg/m   Physical Exam Patient is alert oriented cooperative compliant Pupils are equally round reactive to light with extraocular muscles intact Lungs are clear to auscultation with distant breath sounds Heart is regular rate and rhythm Posterior back reveals less tenderness to palpation and light touch. Her thoracotomy scar remains unremarkable without  evidence of erythema or edema       Assessment & Plan:   Sheila Suarez was seen today for breast pain.  Diagnoses and all orders for this visit:  Intercostal neuralgia -     triamcinolone acetonide (KENALOG-40) injection 40 mg; 1 mL (40 mg total) by Other route once. -     ropivacaine (PF) 2 mg/mL (0.2%) (NAROPIN) injection 10 mL; 10 mLs by Epidural route once. -     midazolam (VERSED) injection 5 mg; Inject 5 mLs (5 mg total) into the vein once. -     lactated ringers infusion 1,000 mL; Inject 1,000 mLs into the vein continuous. -     INTERCOSTAL NERVE BLOCK -     INTERCOSTAL NERVE BLOCK; Future  Acute postthoracotomy pain     ----------------------------------------------------------------------------------------------------------------------  Problem List Items Addressed This Visit    None    Visit Diagnoses    Intercostal neuralgia    -  Primary   Relevant Medications   triamcinolone  acetonide (KENALOG-40) injection 40 mg (Completed)   ropivacaine (PF) 2 mg/mL (0.2%) (NAROPIN) injection 10 mL (Completed)   midazolam (VERSED) injection 5 mg (Completed)   lactated ringers infusion 1,000 mL   Other Relevant Orders   INTERCOSTAL NERVE BLOCK   Acute postthoracotomy pain          ----------------------------------------------------------------------------------------------------------------------  1. Acute postthoracotomy pain She appears to have responded favorably to her first diagnostic injection. Upon discharge from the clinic at her last visit she was pain-free. She has had recurrence of pain of the same quality characteristic and the left anterior cerebral breast area however her back pain has improved. Otherwise she is in her usual state of health and desires to proceed with a repeat injection today for therapeutic purpose. We have gone over the risks and benefits of this in detail and we'll proceed today. She is to return to clinic in approximately 1 month for reevaluation and possible repeat injection at that time.   2. Chronic left-sided thoracic back pain Continue with physical therapy. We have also talked about medication management in addition to serotonin uptake inhibitors, tricyclic antidepressants, Lidoderm patch but she is interested in none of these modalities.  ----------------------------------------------------------------------------------------------------------------------  I am having Ms. Franckowiak maintain her gabapentin, meloxicam, ondansetron, ranitidine, albuterol, umeclidinium-vilanterol, traMADol, ALPRAZolam, sucralfate, linaclotide, omeprazole, albuterol, umeclidinium-vilanterol, sucralfate, albuterol, ALPRAZolam, umeclidinium-vilanterol, and gabapentin. We administered triamcinolone acetonide, ropivacaine (PF) 2 mg/mL (0.2%), midazolam, and lactated ringers.   Meds ordered this encounter  Medications  . triamcinolone acetonide (KENALOG-40)  injection 40 mg  . ropivacaine (PF) 2 mg/mL (0.2%) (NAROPIN) injection 10 mL  . midazolam (VERSED) injection 5 mg  . lactated ringers infusion 1,000 mL    Procedure: #2 Left side posterior thoracic intercostal nerve block at T6 T7 T8 with moderate sedation  Patient was taken to the fluoroscopy suite and placed in the prone position. IV sedation was utilized with 3 mg of Versed and this was well-tolerated and yielded moderate sedation. The area overlying the thoracic region was prepped with Betadine 3 after identifying the area overlying the T6 T7 T8 rib margins with fluoroscopic guidance. This was 5 cm to the left of midline. 1% lidocaine was infiltrated with a 25-gauge needle 1 cc to the subcutaneous region. I then advanced a 22-gauge 1.5 inch quickie needle to walk off the inferior posterior margin of the rib in the usual fashion. There was negative aspiration for heme or air. I then injected 3 cc of ropivacaine 0.2% mixed with 13  mg of triamcinolone at T6 T7 and T8. The procedure was tolerated without difficulty and upon discharge she had resolution of her left thoracic pain vital signs were stable throughout the procedure and she is to return to clinic as indicated.   Follow-up: Return for evaluation, procedure.    Molli Barrows, MD 12:35 PM  The [place] practitioner database for opioid medications on this patient has been reviewed by me and my staff   Greater than 50% of the total encounter time was spent in counseling and / or coordination of care.     This dictation was performed utilizing Systems analyst.  Please excuse any unintentional or mistaken typographical errors as a result.

## 2016-12-28 ENCOUNTER — Telehealth: Payer: Self-pay

## 2016-12-28 NOTE — Telephone Encounter (Signed)
Post procedure phone call.  Left message.  

## 2017-02-13 ENCOUNTER — Ambulatory Visit: Payer: BLUE CROSS/BLUE SHIELD | Admitting: Anesthesiology

## 2017-02-13 ENCOUNTER — Ambulatory Visit
Admission: RE | Admit: 2017-02-13 | Discharge: 2017-02-13 | Disposition: A | Discharge status: Home / Self Care | Payer: BLUE CROSS/BLUE SHIELD | Source: Ambulatory Visit | Attending: Anesthesiology | Admitting: Anesthesiology

## 2017-02-13 ENCOUNTER — Other Ambulatory Visit: Payer: Self-pay | Admitting: Anesthesiology

## 2017-02-13 DIAGNOSIS — R52 Pain, unspecified: Secondary | ICD-10-CM

## 2017-02-19 ENCOUNTER — Ambulatory Visit: Payer: BLUE CROSS/BLUE SHIELD | Admitting: Gastroenterology

## 2017-03-06 ENCOUNTER — Other Ambulatory Visit: Payer: Self-pay | Admitting: Anesthesiology

## 2017-03-06 ENCOUNTER — Encounter: Payer: Self-pay | Admitting: Anesthesiology

## 2017-03-06 ENCOUNTER — Ambulatory Visit
Admission: RE | Admit: 2017-03-06 | Discharge: 2017-03-06 | Disposition: A | Discharge status: Home / Self Care | Payer: BLUE CROSS/BLUE SHIELD | Source: Ambulatory Visit | Attending: Anesthesiology | Admitting: Anesthesiology

## 2017-03-06 ENCOUNTER — Ambulatory Visit (HOSPITAL_BASED_OUTPATIENT_CLINIC_OR_DEPARTMENT_OTHER): Payer: BLUE CROSS/BLUE SHIELD | Admitting: Anesthesiology

## 2017-03-06 VITALS — BP 138/83 | HR 58 | Temp 97.4°F | Resp 21 | Ht 65.0 in | Wt 112.0 lb

## 2017-03-06 DIAGNOSIS — I2510 Atherosclerotic heart disease of native coronary artery without angina pectoris: Secondary | ICD-10-CM | POA: Insufficient documentation

## 2017-03-06 DIAGNOSIS — Z800 Family history of malignant neoplasm of digestive organs: Secondary | ICD-10-CM | POA: Insufficient documentation

## 2017-03-06 DIAGNOSIS — G588 Other specified mononeuropathies: Secondary | ICD-10-CM

## 2017-03-06 DIAGNOSIS — G8912 Acute post-thoracotomy pain: Secondary | ICD-10-CM | POA: Insufficient documentation

## 2017-03-06 DIAGNOSIS — Z801 Family history of malignant neoplasm of trachea, bronchus and lung: Secondary | ICD-10-CM | POA: Insufficient documentation

## 2017-03-06 DIAGNOSIS — M546 Pain in thoracic spine: Secondary | ICD-10-CM

## 2017-03-06 DIAGNOSIS — G8929 Other chronic pain: Secondary | ICD-10-CM

## 2017-03-06 DIAGNOSIS — Z8249 Family history of ischemic heart disease and other diseases of the circulatory system: Secondary | ICD-10-CM | POA: Insufficient documentation

## 2017-03-06 DIAGNOSIS — Z902 Acquired absence of lung [part of]: Secondary | ICD-10-CM | POA: Insufficient documentation

## 2017-03-06 DIAGNOSIS — G580 Intercostal neuropathy: Secondary | ICD-10-CM | POA: Insufficient documentation

## 2017-03-06 DIAGNOSIS — K219 Gastro-esophageal reflux disease without esophagitis: Secondary | ICD-10-CM | POA: Insufficient documentation

## 2017-03-06 DIAGNOSIS — R52 Pain, unspecified: Secondary | ICD-10-CM

## 2017-03-06 DIAGNOSIS — Z87891 Personal history of nicotine dependence: Secondary | ICD-10-CM | POA: Insufficient documentation

## 2017-03-06 DIAGNOSIS — J439 Emphysema, unspecified: Secondary | ICD-10-CM | POA: Insufficient documentation

## 2017-03-06 DIAGNOSIS — Z79899 Other long term (current) drug therapy: Secondary | ICD-10-CM | POA: Diagnosis not present

## 2017-03-06 DIAGNOSIS — M329 Systemic lupus erythematosus, unspecified: Secondary | ICD-10-CM | POA: Diagnosis not present

## 2017-03-06 MED ORDER — TRIAMCINOLONE ACETONIDE 40 MG/ML IJ SUSP
40.0000 mg | Freq: Once | INTRAMUSCULAR | Status: AC
  Administered 2017-03-06: 40 mg
  Filled 2017-03-06: qty 1

## 2017-03-06 MED ORDER — ROPIVACAINE HCL 2 MG/ML IJ SOLN
10.0000 mL | Freq: Once | INTRAMUSCULAR | Status: AC
  Administered 2017-03-06: 10 mL via EPIDURAL
  Filled 2017-03-06: qty 10

## 2017-03-06 MED ORDER — MIDAZOLAM HCL 2 MG/2ML IJ SOLN
5.0000 mg | Freq: Once | INTRAMUSCULAR | Status: AC
  Administered 2017-03-06: 5 mg via INTRAVENOUS

## 2017-03-06 MED ORDER — LIDOCAINE HCL (PF) 1 % IJ SOLN
5.0000 mL | Freq: Once | INTRAMUSCULAR | Status: AC
  Administered 2017-03-06: 5 mL via SUBCUTANEOUS

## 2017-03-06 MED ORDER — MIDAZOLAM HCL 5 MG/5ML IJ SOLN
INTRAMUSCULAR | Status: AC
  Filled 2017-03-06: qty 5

## 2017-03-06 NOTE — Progress Notes (Signed)
Subjective:  Patient ID: Sheila Suarez, female    DOB: [birth year]  Age: 61 y.o. MRN: [id number]  CC: Back Pain (mid)  Service Provided on Last Visit: Procedure PROCEDURE:#3 Left posterior thoracic intercostal nerve block under fluoroscopic guidance with moderate sedation at T7 T8 T9  HPI [name] presents for Reevaluation. She was last seen a few months ago and had her second posterior thoracic intercostal nerve block at that time. At this point she feels that her pain is under better control. She's having less severe pain in the posterior back she still having some pain underneath the left breast region but it's not as intense. She has been able to start wearing a bra without difficulty. The severity of pain has diminished and it is less frequent overall. She desired to proceed with a repeat intercostal nerve block today. She continues to have some cramping like pain underneath the left breast in the mid axillary line of the same quality as per her initial presentation to the clinic.   Originally and By history She is a pleasant 61 year old female that has had a recent thoracotomy for a lobe resection with Dr. Genevive Bi. She is reporting some persistent pain in a dermatomal distribution left side in the mid axillary line and below the posterior left scapula. The pain quality is cramping with associated itching and nagging continuous symptoms. Occasionally the pain as stabbing but is generally uncomfortable with a pain score of 5 out of 10. It does not appear to be influenced by time of day and has persisted since her surgery in September 2017. No associated shortness of breath or chest pain or difficulty with inspiration or expiration are noted. She has some cramping pain at night and despite medication management the pain persists. She reports having had a thorough postoperative workup with Dr. Faith Rogue which was otherwise unremarkable. Postoperative associated problems.  History [place] has a  past medical history of Cancer ([place]); Coronary artery disease; Emphysema of lung ([place]); GERD (gastroesophageal reflux disease); Lupus; and Shortness of breath dyspnea.   She has a past surgical history that includes Tubal ligation (Bilateral); Cyst excision (Left); Flexible bronchoscopy (N/A, 04/17/2016); Thoracotomy (Left, 04/17/2016); Colonoscopy with propofol (N/A, 06/02/2016); Esophagogastroduodenoscopy (egd) with propofol (N/A, 06/02/2016); Lung removal, partial (Left); left upper lobectomy (Left, 03/2016); and Hand surgery (Left).   Her family history includes Cancer in her maternal aunt, maternal aunt, and maternal uncle; Cancer (age of onset: 42) in her daughter; Heart disease in her father; Lung cancer in her brother and sister; Pancreatic cancer in her sister; Stomach cancer in her paternal grandmother.She reports that she quit smoking about a year ago. Her smoking use included Cigarettes. She has a 8.75 pack-year smoking history. She quit smokeless tobacco use about a year ago. She reports that she drinks about 0.6 oz of alcohol per week . She reports that she does not use drugs.  No results found for this or any previous visit.  No results found for: TOXASSSELUR  Outpatient Medications Prior to Visit  Medication Sig Dispense Refill  . albuterol (PROVENTIL HFA;VENTOLIN HFA) 108 (90 Base) MCG/ACT inhaler Inhale into the lungs.    . ALPRAZolam (XANAX) 0.25 MG tablet Take by mouth.    . gabapentin (NEURONTIN) 100 MG capsule Take by mouth.    . linaclotide (LINZESS) 145 MCG CAPS capsule TAKE 1 CAPSULE (145 MCG TOTAL) BY MOUTH DAILY BEFORE BREAKFAST. 30 capsule 3  . meloxicam (MOBIC) 7.5 MG tablet daily.     . ondansetron (ZOFRAN) 4  MG tablet Take 4 mg by mouth as needed.     . ranitidine (ZANTAC) 150 MG tablet Take 150 mg by mouth daily.     . sucralfate (CARAFATE) 1 g tablet   0  . traMADol (ULTRAM) 50 MG tablet Take 50 mg by mouth every 6 (six) hours as needed for moderate pain.    Marland Kitchen  umeclidinium-vilanterol (ANORO ELLIPTA) 62.5-25 MCG/INH AEPB Take by mouth.    Marland Kitchen omeprazole (PRILOSEC) 40 MG capsule Take 1 capsule (40 mg total) by mouth daily. 30 capsule 3   No facility-administered medications prior to visit.    Lab Results  Component Value Date   WBC 8.9 11/19/2016   HGB 12.8 11/19/2016   HCT 39.0 11/19/2016   PLT 420 11/19/2016   GLUCOSE 82 11/19/2016   ALT 11 (L) 11/19/2016   AST 16 11/19/2016   NA 137 11/19/2016   K 3.7 11/19/2016   CL 105 11/19/2016   CREATININE 0.69 11/19/2016   BUN 9 11/19/2016   CO2 25 11/19/2016   TSH 0.635 02/09/2016   INR 0.91 03/28/2016    --------------------------------------------------------------------------------------------------------------------- Ct Angio Neck W Or Wo Contrast  Result Date: 06/06/2016 CLINICAL DATA:  Initial evaluation for acute onset severe anterior neck pain. EXAM: CT ANGIOGRAPHY NECK TECHNIQUE: Multidetector CT imaging of the neck was performed using the standard protocol during bolus administration of intravenous contrast. Multiplanar CT image reconstructions and MIPs were obtained to evaluate the vascular anatomy. Carotid stenosis measurements (when applicable) are obtained utilizing NASCET criteria, using the distal internal carotid diameter as the denominator. CONTRAST:  100 cc of Isovue 370. COMPARISON:  None available. FINDINGS: Aortic arch: Examination is somewhat technically limited due to timing of the contrast bolus. Aortic arch of normal caliber with normal 3 vessel morphology. Scattered plaque about the origin of the great vessels without high-grade stenosis. Visualized subclavian arteries are widely patent. Right carotid system: Right common carotid artery patent from its origin to the bifurcation without stenosis or acute abnormality. No significant atheromatous disease about the right carotid bifurcation. Right ICA patent from the bifurcation to the skullbase without dissection, stenosis or  occlusion. Right external carotid artery and its branches within normal limits. Left carotid system: Left common carotid artery patent from its origin to the bifurcation without stenosis or definite acute abnormality. Evaluation mildly limited by streak artifact from prominent contrast within the adjacent left internal jugular vein. Mild a centric plaque about the left bifurcation without significant stenosis. Left ICA patent distally to the skullbase without stenosis, dissection, or occlusion. Left external carotid artery and its branches within normal limits. Vertebral arteries: Both of the vertebral arteries arise from the subclavian arteries. Left vertebral artery not well evaluated proximally due to adjacent venous contrast. Proximal pre foraminal right V1 segment within normal limits. Vertebral arteries patent distally without stenosis, dissection, or occlusion. Distal left P2/P3 segments not well evaluated due to adjacent venous contrast as well. Visualized basilar artery widely patent. Skeleton: No acute osseous abnormality. No worrisome lytic or blastic osseous lesions. Other neck: Visualized soft tissues of the neck demonstrate no acute abnormality. Please note evaluation somewhat limited by streak artifact from IV contrast within multiple venous collaterals within the neck. No definite adenopathy. Thyroid grossly unremarkable. Upper chest: Visualized upper chest demonstrates no definite acute abnormality. This is better evaluated on concomitant CT of the chest orbits same time. Emphysema noted. IMPRESSION: 1. Negative CTA of the neck. No acute vascular abnormality identified within the major arterial vasculature of the neck. No  high-grade or critical stenosis. 2. Mild atheromatous plaque about the left carotid bifurcation without stenosis. 3. Emphysema. Electronically Signed   By: Jeannine Boga M.D.   On: 06/06/2016 21:42   Dg Chest Portable 1 View  Result Date: 06/06/2016 CLINICAL DATA:  Upper  central chest pain since 1600 hours today. Pain is worse with inspiration. EXAM: PORTABLE CHEST 1 VIEW COMPARISON:  05/26/2016 FINDINGS: Volume loss of the left lung status post reported left lower lobectomy with chain sutures noted about the left hilum as before. There is hyperinflation of right lung. The cardiac and mediastinal contours are stable in appearance with atherosclerosis of the aortic arch. The lungs are free of pneumonic consolidations. Pulmonary vasculature is unremarkable. No effusion or pneumothorax. No suspicious osseous abnormalities. IMPRESSION: Stable postoperative appearance of the chest with volume loss on the left. Compensatory hyperinflation of the right. Electronically Signed   By: Ashley Royalty M.D.   On: 06/06/2016 20:28   Ct Angio Chest Aorta W And/or Wo Contrast  Result Date: 06/06/2016 CLINICAL DATA:  Neck pain and history of prior left upper lobectomy for carcinoma EXAM: CT ANGIOGRAPHY CHEST WITH CONTRAST TECHNIQUE: Multidetector CT imaging of the chest was performed using the standard protocol during bolus administration of intravenous contrast. Multiplanar CT image reconstructions and MIPs were obtained to evaluate the vascular anatomy. CONTRAST:  100 mL Isovue 370. COMPARISON:  02/07/2016 FINDINGS: Cardiovascular: Thoracic aorta is within normal limits. No evidence of aneurysmal dilatation or dissection is noted. The pulmonary artery shows a normal branching pattern without evidence of filling defect. Mild coronary calcifications are seen. Mediastinum/Nodes: The thoracic inlet is within normal limits. Considerable collateralization of venous flow is noted related to a focal area of narrowing within the left innominate vein best seen on image number 25 of series 6. No significant hilar or mediastinal adenopathy is identified. Lungs/Pleura: Postsurgical changes are noted consistent with the left upper lobectomy. The right lung is well aerated without focal infiltrate or sizable  effusion. Very mild emphysematous changes are seen. The residual left lung is within normal limits. No evidence of recurrence is seen. Upper Abdomen: Hypodensity is again noted within the right lobe of the liver stable from prior exam dated 02/07/2016 consistent with hemangioma. The remainder the upper abdomen is within normal limits. Musculoskeletal: No acute bony abnormality is noted. Review of the MIP images confirms the above findings. IMPRESSION: Status post left upper lobectomy. 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 No evidence of pulmonary emboli.  No aortic dissection is seen. Stable hepatic hemangioma. Electronically Signed   By: Inez Catalina M.D.   On: 06/06/2016 21:27       ---------------------------------------------------------------------------------------------------------------------- Past Medical History:  Diagnosis Date  . Cancer ([place])   . Coronary artery disease   . Emphysema of lung ([place])   . GERD (gastroesophageal reflux disease)   . Lupus   . Shortness of breath dyspnea     Past Surgical History:  Procedure Laterality Date  . COLONOSCOPY WITH PROPOFOL N/A 06/02/2016   Procedure: COLONOSCOPY WITH PROPOFOL;  Surgeon: [name], MD;  Location: [facility] ENDOSCOPY;  Service: Gastroenterology;  Laterality: N/A;  . CYST EXCISION Left    hand  . ESOPHAGOGASTRODUODENOSCOPY (EGD) WITH PROPOFOL N/A 06/02/2016   Procedure: ESOPHAGOGASTRODUODENOSCOPY (EGD) WITH PROPOFOL;  Surgeon: [name], MD;  Location: [facility] ENDOSCOPY;  Service: Gastroenterology;  Laterality: N/A;  . FLEXIBLE BRONCHOSCOPY N/A 04/17/2016   Procedure: FLEXIBLE BRONCHOSCOPY;  Surgeon: [name],  MD;  Location: [facility] ORS;  Service: Thoracic;  Laterality: N/A;  . HAND SURGERY Left   . left upper lobectomy Left 03/2016  . LUNG REMOVAL, PARTIAL Left    Left lower lobe  . THORACOTOMY Left 04/17/2016   Procedure: THORACOTOMY LEFT UPPER  LOBE LUNG RESECTION;  Surgeon: [name], MD;  Location: [facility] ORS;  Service: Thoracic;  Laterality: Left;  . TUBAL LIGATION Bilateral    37 years ago    Family History  Problem Relation Age of Onset  . Pancreatic cancer Sister   . Lung cancer Sister   . Lung cancer Brother   . Stomach cancer Paternal Grandmother   . Heart disease Father   . Cancer Daughter 74       cervical  . Cancer Maternal Aunt        brain  . Cancer Maternal Uncle        prostate  . Cancer Maternal Aunt        bone    Social History  Substance Use Topics  . Smoking status: Former Smoker    Packs/day: 0.25    Years: 35.00    Types: Cigarettes    Quit date: 03/19/2016  . Smokeless tobacco: Former Systems developer    Quit date: 03/16/2016  . Alcohol use 0.6 oz/week    1 Cans of beer per week     Comment: Rarely    ---------------------------------------------------------------------------------------------------------------------   BP 130/83   Pulse 61   Temp (!) 97.4 F (36.3 C)   Resp 14   Ht 5\' 5"  (1.651 m)   Wt 112 lb (50.8 kg)   SpO2 100%   BMI 18.64 kg/m    BP Readings from Last 3 Encounters:  03/06/17 130/83  12/27/16 109/65  12/17/16 120/80     Wt Readings from Last 3 Encounters:  03/06/17 112 lb (50.8 kg)  12/27/16 121 lb (54.9 kg)  12/17/16 121 lb (54.9 kg)     ----------------------------------------------------------------------------------------------------------------------  ROS Review of Systems  Cardiac: No angina or dyspnea Pulmonary: No cyanosis or pain with inspiration GI: No constipation  Objective:  BP 130/83   Pulse 61   Temp (!) 97.4 F (36.3 C)   Resp 14   Ht 5\' 5"  (1.651 m)   Wt 112 lb (50.8 kg)   SpO2 100%   BMI 18.64 kg/m   Physical Exam Patient is alert oriented cooperative compliant Pupils are equally round reactive to light with extraocular muscles intact Lungs are clear to auscultation with distant breath sounds Heart is regular rate and  rhythm No evidence of allodynia or hyperalgesia with a stable examination       Assessment & Plan:   [place] was seen today for back pain.  Diagnoses and all orders for this visit:  Intercostal neuralgia -     INTERCOSTAL NERVE BLOCK -     triamcinolone acetonide (KENALOG-40) injection 40 mg; 1 mL (40 mg total) by Other route once. -     ropivacaine (PF) 2 mg/mL (0.2%) (NAROPIN) injection 10 mL; 10 mLs by Epidural route once. -     lidocaine (PF) (XYLOCAINE) 1 % injection 5 mL; Inject 5 mLs into the skin once. -     INTERCOSTAL NERVE BLOCK; Future -     midazolam (VERSED) injection 5 mg; Inject 5 mLs (5 mg total) into the vein once.  Acute postthoracotomy pain  Chronic left-sided thoracic back pain     ----------------------------------------------------------------------------------------------------------------------  Problem List Items Addressed This Visit  None    Visit Diagnoses    Intercostal neuralgia    -  Primary   Relevant Medications   triamcinolone acetonide (KENALOG-40) injection 40 mg (Completed)   ropivacaine (PF) 2 mg/mL (0.2%) (NAROPIN) injection 10 mL (Completed)   lidocaine (PF) (XYLOCAINE) 1 % injection 5 mL (Completed)   midazolam (VERSED) injection 5 mg (Completed)   Other Relevant Orders   INTERCOSTAL NERVE BLOCK   Acute postthoracotomy pain       Chronic left-sided thoracic back pain       Relevant Medications   triamcinolone acetonide (KENALOG-40) injection 40 mg (Completed)   ropivacaine (PF) 2 mg/mL (0.2%) (NAROPIN) injection 10 mL (Completed)   lidocaine (PF) (XYLOCAINE) 1 % injection 5 mL (Completed)      ----------------------------------------------------------------------------------------------------------------------  1. Acute postthoracotomy pain She continues to show favorable progress with the intercostal nerve blocks and we'll proceed with a repeat injection today and span these out to her next eval in 2 month. That would  be her fourth injection. She appears to be making good progress and the risks benefits been reviewed with her once again and all questions answered. 2. Chronic left-sided thoracic back pain Continue with physical therapy. We have also talked about medication management in addition to serotonin uptake inhibitors, tricyclic antidepressants, Lidoderm patch but she is interested in any of these modalities.  ----------------------------------------------------------------------------------------------------------------------  I am having Ms. Rape maintain her meloxicam, ondansetron, ranitidine, traMADol, linaclotide, omeprazole, sucralfate, albuterol, ALPRAZolam, umeclidinium-vilanterol, and gabapentin. We administered triamcinolone acetonide, ropivacaine (PF) 2 mg/mL (0.2%), lidocaine (PF), and midazolam.   Meds ordered this encounter  Medications  . triamcinolone acetonide (KENALOG-40) injection 40 mg  . ropivacaine (PF) 2 mg/mL (0.2%) (NAROPIN) injection 10 mL  . lidocaine (PF) (XYLOCAINE) 1 % injection 5 mL  . midazolam (VERSED) injection 5 mg    Procedure: #3 Left side posterior thoracic intercostal nerve block at T7 T8 T9 with moderate sedation  Patient was taken to the fluoroscopy suite and placed in the prone position. IV sedation was utilized with 3 mg of Versed and this was well-tolerated and yielded moderate sedation. The area overlying the thoracic region was prepped with Betadine 3 after identifying the area overlying the T7 T8 T9 rib margins with fluoroscopic guidance. This was 5 cm to the left of midline. 1% lidocaine was infiltrated with a 25-gauge needle 1 cc to the subcutaneous region. I then advanced a 22-gauge 1.5 inch quickie needle to walk off the inferior posterior margin of the rib in the usual fashion. There was negative aspiration for heme or air. I then injected 3 cc of ropivacaine 0.2% mixed with 13 mg of triamcinolone at the aforementioned levels. The procedure was  tolerated without difficulty and upon discharge she had resolution of her left thoracic pain vital signs were stable throughout the procedure and she is to return to clinic as indicated.   Follow-up: Return for evaluation, procedure.    Molli Barrows, MD 4:18 PM  The [place] practitioner database for opioid medications on this patient has been reviewed by me and my staff   Greater than 50% of the total encounter time was spent in counseling and / or coordination of care.     This dictation was performed utilizing Systems analyst.  Please excuse any unintentional or mistaken typographical errors as a result.

## 2017-03-06 NOTE — Progress Notes (Signed)
Safety precautions to be maintained throughout the outpatient stay will include: orient to surroundings, keep bed in low position, maintain call bell within reach at all times, provide assistance with transfer out of bed and ambulation.  

## 2017-03-06 NOTE — Patient Instructions (Signed)
Pain Management Discharge Instructions  General Discharge Instructions :  If you need to reach your doctor call: Monday-Friday 8:00 am - 4:00 pm at [phone] or toll free [phone].  After clinic hours [phone] to have operator reach doctor.  Bring all of your medication bottles to all your appointments in the pain clinic.  To cancel or reschedule your appointment with Pain Management please remember to call 24 hours in advance to avoid a fee.  Refer to the educational materials which you have been given on: General Risks, I had my Procedure. Discharge Instructions, Post Sedation.  Post Procedure Instructions:  The drugs you were given will stay in your system until tomorrow, so for the next 24 hours you should not drive, make any legal decisions or drink any alcoholic beverages.  You may eat anything you prefer, but it is better to start with liquids then soups and crackers, and gradually work up to solid foods.  Please notify your doctor immediately if you have any unusual bleeding, trouble breathing or pain that is not related to your normal pain.  Depending on the type of procedure that was done, some parts of your body may feel week and/or numb.  This usually clears up by tonight or the next day.  Walk with the use of an assistive device or accompanied by an adult for the 24 hours.  You may use ice on the affected area for the first 24 hours.  Put ice in a Ziploc bag and cover with a towel and place against area 15 minutes on 15 minutes off.  You may switch to heat after 24 hours.Intercostal Nerve Block Patient Information  Description: The twelve intercostal nerves arise from the first thru twelfth thoracic nerve roots.  The nerve begins at the spine and wraps around the body, lying in a groove underneath each rib.  Each intercostal nerve innervates a specific strip of skin and body walk of the abdomen and chest.  Therefore, injuries of the chest wall or abdominal wall  result in pain that is transmitted back to the brian via the intercostal nerves.  Examples of such injuries include rib fractures and incisions for lung and gall bladder surgery.  Occasionally, pain may persist long after an injury or surgical incision secondary to inflammation and irritation of the intercostal nerve.  The longstanding pain is known as intercostal neuralgia.  An intercostal nerve block is preformed to eliminate pain either temporarily or permanently.  A small needle is placed below the rib and local anesthetic (like Novocaine) and possibly steroid is injected.  Usually 2-4 intercostal nerves are blocked at a time depending on the problem.  The patient will experience a slight "pin-prick" sensation for each injection.  Shortly thereafter, the strip of skin that is innervated by the blocked intercostal nerve will feel numb.  Persistent pain that is only temporarily relieved with local anesthetic may require a more permanent block. This procedure is called Cryoneurolysis and entails placing a small probe beneath the rib to freeze the nerve.  Conditions that may be treated by intercostal nerve blocks:   Rib fractures  Longstanding pain from surgery of the chest or abdomen (intercostal neuralgia)  Pain from chest tubes  Pain from trauma to the chest  Preparation for the injections:  1. Do not eat any solid food or dairy products within 8 hours of your appointment. 2. You may drink clear liquids up to 3 hours before appointment.  Clear liquids include water, black coffee, juice or soda.  No milk or cream please. 3. You may take your regular medication, including pain medications, with a sip of water before your appointment.  Diabetics should hold regular insulin (if take separately) and take 1/2 normal NPH dose the morning of the procedure.   Carry some sugar containing items with you to your appointment. 4. A driver must accompany you and be prepared to drive you home after your  procedure. 5. Bring all your current medications with you. 6. An IV may be inserted and sedation may be given at the discretion of the physician. 7. A blood pressure cuff, EKG and other monitors will often be applied during the procedure.  Some patients may need to have extra oxygen administered for a short period. 8. You will be asked to provide medical information, including your allergies, prior to the procedure.  We must know immediately if you are taking blood thinners (like Coumadin/Warfarin) or if you are allergic to IV iodine contrast (dye). We must know if you could possible be pregnant.  Possible side-effects:   Bleeding from needle site  Infection (rare)  Nerve injury (rare)  Numbness & tingling of skin  Collapsed lung requiring chest tube (rare)  Local anesthetic toxicity (rare)  Light-headedness (temporary)  Pain at injection site (several days)  Decreased blood pressure (temporary)  Shortness of breath  Jittery/shaking sensation (temporary)  Call if you experience:   Difficulty breathing or hives (go directly to the emergency room)  Redness, inflammation or drainage at the injection site  Severe pain at the site of the injection  Any new symptoms which are concerning   Please note:  Your pain may subside immediately but may return several hours after the injection.  Often, more than one injection is required to reduce the pain. Also, if several temporary blocks with local anesthetic are ineffective, a more permanent block with cryolysis may be necessary.  This will be discussed with you should this be the case.  If you have any questions, please call [phone] [facility]

## 2017-03-07 ENCOUNTER — Telehealth: Payer: Self-pay | Admitting: *Deleted

## 2017-03-07 NOTE — Telephone Encounter (Signed)
Spoke with patient re; procedure on yesterday, verbalizes no questions or concerns.  

## 2017-03-19 DIAGNOSIS — M79605 Pain in left leg: Secondary | ICD-10-CM | POA: Insufficient documentation

## 2017-03-19 DIAGNOSIS — Z87891 Personal history of nicotine dependence: Secondary | ICD-10-CM | POA: Insufficient documentation

## 2017-03-19 DIAGNOSIS — M79604 Pain in right leg: Secondary | ICD-10-CM | POA: Insufficient documentation

## 2017-04-01 ENCOUNTER — Other Ambulatory Visit: Payer: Self-pay | Admitting: Gastroenterology

## 2017-04-05 ENCOUNTER — Encounter (INDEPENDENT_AMBULATORY_CARE_PROVIDER_SITE_OTHER): Payer: Self-pay

## 2017-04-05 ENCOUNTER — Ambulatory Visit (INDEPENDENT_AMBULATORY_CARE_PROVIDER_SITE_OTHER): Payer: BLUE CROSS/BLUE SHIELD | Admitting: Gastroenterology

## 2017-04-05 ENCOUNTER — Encounter: Payer: Self-pay | Admitting: Gastroenterology

## 2017-04-05 VITALS — BP 120/77 | HR 60 | Temp 97.5°F | Ht 65.0 in | Wt 121.2 lb

## 2017-04-05 DIAGNOSIS — K581 Irritable bowel syndrome with constipation: Secondary | ICD-10-CM

## 2017-04-05 NOTE — Progress Notes (Signed)
[name] MD, MRCP(U.K) [address]  [place]  [place], [place] [address]  Main: [phone]  Fax: [phone]   Primary Care Physician: [facility] [place]  Primary Gastroenterologist:  Dr. [name]   No chief complaint on file.   HPI: Sheila Suarez is a 61 y.o. female  [place] a 61 y.o.She is here for a follow up visit for abdominal pain felt likely secondary to IBS-C, H pylori gastritis  . She was last seen on 4/18  .   Summary of history : Abdominal pain ongoing for many years, upper abdomen, severe constipation and relief after a bowel movement . She has been on narcotics. She had a normal colonoscopy on 06/02/2016 and an EGD which appeared normal but biopsies taken showed active chronic gastritis with H pylori .triple therapy prescribed on 06/16/16 .completed treatment for H pylori. She is a S/p left thoracotomy and LUL lobectomy . H pylori stool antigen subsequently was negative  LFT's normal in 05/2016 . PET scan in 01/2016 showed only some mild hypermetabolism in the stomach and rest of GI tract was negative.  Interval history 10/2016-04/05/2017    She has a bowel movement once in 2-3 weeks ,On linzess 145 mcg, lots of bloating . When she has a good bowel movement her "stomach hurts". Takes her Prilosec regularly . Gained 2 lbs since last visit. Stopped tramadol, was on meloxicam .Last bowel movement was a week back.      BP 120/77   Pulse 60   Temp (!) 97.5 F (36.4 C) (Oral)   Ht 5\' 5"  (1.651 m)   Wt 121 lb 3.2 oz (55 kg)   BMI 20.17 kg/m    Current Outpatient Prescriptions  Medication Sig Dispense Refill  . albuterol (PROVENTIL HFA;VENTOLIN HFA) 108 (90 Base) MCG/ACT inhaler Inhale into the lungs.    . ALPRAZolam (XANAX) 0.25 MG tablet Take by mouth.    . gabapentin (NEURONTIN) 100 MG capsule Take by mouth.    . linaclotide (LINZESS) 145 MCG CAPS capsule TAKE 1 CAPSULE (145 MCG TOTAL) BY MOUTH DAILY BEFORE  BREAKFAST. 30 capsule 3  . meloxicam (MOBIC) 7.5 MG tablet daily.     Marland Kitchen omeprazole (PRILOSEC) 40 MG capsule TAKE 1 CAPSULE (40 MG TOTAL) BY MOUTH DAILY. 30 capsule 0  . ondansetron (ZOFRAN) 4 MG tablet Take 4 mg by mouth as needed.     . ranitidine (ZANTAC) 150 MG tablet Take 150 mg by mouth daily.     . sucralfate (CARAFATE) 1 g tablet   0  . traMADol (ULTRAM) 50 MG tablet Take 50 mg by mouth every 6 (six) hours as needed for moderate pain.    Marland Kitchen umeclidinium-vilanterol (ANORO ELLIPTA) 62.5-25 MCG/INH AEPB Take by mouth.     No current facility-administered medications for this visit.     Allergies as of 04/05/2017  . (No Known Allergies)    ROS:  General: Negative for anorexia, weight loss, fever, chills, fatigue, weakness. ENT: Negative for hoarseness, difficulty swallowing , nasal congestion. CV: Negative for chest pain, angina, palpitations, dyspnea on exertion, peripheral edema.  Respiratory: Negative for dyspnea at rest, dyspnea on exertion, cough, sputum, wheezing.  GI: See history of present illness. GU:  Negative for dysuria, hematuria, urinary incontinence, urinary frequency, nocturnal urination.  Endo: Negative for unusual weight change.    Physical Examination:   BP 120/77   Pulse 60   Temp (!) 97.5 F (36.4 C) (Oral)   Ht 5\' 5"  (1.651  m)   Wt 121 lb 3.2 oz (55 kg)   BMI 20.17 kg/m   General: Well-nourished, well-developed in no acute distress.  Eyes: No icterus. Conjunctivae pink. Mouth: Oropharyngeal mucosa moist and pink , no lesions erythema or exudate. Lungs: Clear to auscultation bilaterally. Non-labored. Heart: Regular rate and rhythm, no murmurs rubs or gallops.  Abdomen: Bowel sounds are normal, nontender, mild abdominal distension , no hepatosplenomegaly or masses, no abdominal bruits or hernia , no rebound or guarding.   Extremities: No lower extremity edema. No clubbing or deformities. Neuro: Alert and oriented x 3.  Grossly intact. Skin: Warm and  dry, no jaundice.   Psych: Alert and cooperative, normal mood and affect.   Imaging Studies: Dg C-arm 1-60 Min-no Report  Result Date: 03/06/2017 Fluoroscopy was utilized by the requesting physician.  No radiographic interpretation.    Assessment and Plan:   Sheila Suarez is a 61 y.o. y/o female here for follow up.    1.  Irritable bowel syndrome with constipation - On linzess 145 mcg a day , not responding at this time, will increase dose to 290 mcg, 2 weeks samples provided, if no better at next visit will put on Trulance.   Dr [name]  MD,MRCP [facility]) Follow up in 2-3 weeks

## 2017-04-17 ENCOUNTER — Encounter (INDEPENDENT_AMBULATORY_CARE_PROVIDER_SITE_OTHER): Payer: Self-pay | Admitting: Vascular Surgery

## 2017-04-19 ENCOUNTER — Encounter (INDEPENDENT_AMBULATORY_CARE_PROVIDER_SITE_OTHER): Payer: Self-pay

## 2017-04-19 ENCOUNTER — Encounter: Payer: Self-pay | Admitting: Gastroenterology

## 2017-04-19 ENCOUNTER — Ambulatory Visit: Payer: BLUE CROSS/BLUE SHIELD | Admitting: Gastroenterology

## 2017-04-19 ENCOUNTER — Ambulatory Visit (INDEPENDENT_AMBULATORY_CARE_PROVIDER_SITE_OTHER): Payer: BLUE CROSS/BLUE SHIELD | Admitting: Gastroenterology

## 2017-04-19 VITALS — BP 104/63 | HR 67 | Temp 98.3°F | Ht 65.0 in | Wt 119.8 lb

## 2017-04-19 DIAGNOSIS — K2970 Gastritis, unspecified, without bleeding: Secondary | ICD-10-CM

## 2017-04-19 DIAGNOSIS — K581 Irritable bowel syndrome with constipation: Secondary | ICD-10-CM

## 2017-04-19 NOTE — Progress Notes (Signed)
Sheila Bellows MD, MRCP(U.K) [address]  [place]  [place], [place] [address]  Main: [phone]  Fax: [phone]   Primary Care Physician: [facility] [place]  Primary Gastroenterologist:  Dr. Jonathon Suarez   Chief Complaint  Patient presents with  . Follow-up    HPI: Sheila Suarez is a 61 y.o. female   Sheila Suarez a 61 y.o.She is here for a follow up visit for IBS-C,gastritis  . She was last seen on 11/20/16  .   Summary of history : Abdominal pain ongoing for many years, upper abdomen, severe constipation and relief after a bowel movement . She has been on narcotics. She had a normal colonoscopy on 06/02/2016 and an EGD which appeared normal but biopsies taken showed active chronic gastritis with H pylori .triple therapy prescribed on 06/16/16 .completed treatment for H pylori.Eradication confirmed subsequently.  She is a S/p left thoracotomy and LUL lobectomy .LFT's normal in 05/2016 . PET scan in 01/2016 showed only some mild hypermetabolism in the stomach and rest of GI tract was negative.  Interval history 10/2016-03/2017   On linzess 145 mcg which helps her . Not every day is she having a bowel movement which is hard.  No change in weight  She is taking omeprazole regularly.    Current Outpatient Prescriptions  Medication Sig Dispense Refill  . albuterol (PROVENTIL HFA;VENTOLIN HFA) 108 (90 Base) MCG/ACT inhaler Inhale into the lungs.    . ALPRAZolam (XANAX) 0.25 MG tablet Take by mouth.    . gabapentin (NEURONTIN) 100 MG capsule Take by mouth.    . linaclotide (LINZESS) 145 MCG CAPS capsule TAKE 1 CAPSULE (145 MCG TOTAL) BY MOUTH DAILY BEFORE BREAKFAST. 30 capsule 3  . omeprazole (PRILOSEC) 40 MG capsule TAKE 1 CAPSULE (40 MG TOTAL) BY MOUTH DAILY. 30 capsule 0  . ondansetron (ZOFRAN) 4 MG tablet Take 4 mg by mouth as needed.     . ranitidine (ZANTAC) 150 MG tablet Take 150 mg by mouth daily.     . sucralfate (CARAFATE) 1 g  tablet   0  . traMADol (ULTRAM) 50 MG tablet Take 50 mg by mouth every 6 (six) hours as needed for moderate pain.    Marland Kitchen umeclidinium-vilanterol (ANORO ELLIPTA) 62.5-25 MCG/INH AEPB Take by mouth.    . meloxicam (MOBIC) 7.5 MG tablet daily.      No current facility-administered medications for this visit.     Allergies as of 04/19/2017  . (No Known Allergies)    ROS:  General: Negative for anorexia, weight loss, fever, chills, fatigue, weakness. ENT: Negative for hoarseness, difficulty swallowing , nasal congestion. CV: Negative for chest pain, angina, palpitations, dyspnea on exertion, peripheral edema.  Respiratory: Negative for dyspnea at rest, dyspnea on exertion, cough, sputum, wheezing.  GI: See history of present illness. GU:  Negative for dysuria, hematuria, urinary incontinence, urinary frequency, nocturnal urination.  Endo: Negative for unusual weight change.    Physical Examination:   BP 104/63   Pulse 67   Temp 98.3 F (36.8 C) (Oral)   Ht 5\' 5"  (1.651 m)   Wt 119 lb 12.8 oz (54.3 kg)   BMI 19.94 kg/m   General: Well-nourished, well-developed in no acute distress.  Eyes: No icterus. Conjunctivae pink. Mouth: Oropharyngeal mucosa moist and pink , no lesions erythema or exudate. Lungs: Clear to auscultation bilaterally. Non-labored. Heart: Regular rate and rhythm, no murmurs rubs or gallops.  Abdomen: Bowel sounds are normal, nontender, nondistended, no hepatosplenomegaly or masses, no  abdominal bruits or hernia , no rebound or guarding.   Extremities: No lower extremity edema. No clubbing or deformities. Neuro: Alert and oriented x 3.  Grossly intact. Skin: Warm and dry, no jaundice.   Psych: Alert and cooperative, normal mood and affect.   Imaging Studies: No results found.  Assessment and Plan:   Sheila Suarez is a 61 y.o. y/o female here for follow up for IBS-C and GERD.    1. GERD/gastritis- stable on PPI no pain   2. Irritable bowel  syndrome with constipation - On linzess 145 mcg a day , she says having a bowel movement most days but still on the harder side, would prefer softer consitency - will provide a higher dose of linzess 290 mcg samples to see if works better, she will call back for a script if doing well.   Dr Sheila Bellows  MD,MRCP [facility]) Follow up in 6 months

## 2017-04-26 ENCOUNTER — Encounter (INDEPENDENT_AMBULATORY_CARE_PROVIDER_SITE_OTHER): Payer: Self-pay

## 2017-04-26 ENCOUNTER — Encounter (INDEPENDENT_AMBULATORY_CARE_PROVIDER_SITE_OTHER): Payer: BLUE CROSS/BLUE SHIELD | Admitting: Vascular Surgery

## 2017-05-03 ENCOUNTER — Other Ambulatory Visit: Payer: Self-pay | Admitting: Gastroenterology

## 2017-05-07 ENCOUNTER — Telehealth: Payer: Self-pay | Admitting: Internal Medicine

## 2017-05-07 NOTE — Telephone Encounter (Signed)
Patient made aware she should get flu.

## 2017-05-07 NOTE — Telephone Encounter (Signed)
Pt calling stating it has been abour 9 months since we took a lung out from her She is thinking to get a flu shot.  States in past she normally gets sick from taking it  Would It be safe for her to get one  Please advise

## 2017-05-08 ENCOUNTER — Telehealth: Payer: Self-pay

## 2017-05-08 NOTE — Telephone Encounter (Signed)
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 I advised her to speak with her primary care doctor. She insisted on me speaking with Dr. Genevive Bi since he did her surgery a few months ago. I verbalized understanding and advised patient I would call her back once I speak with him.  I spoke with Dr. Genevive Bi and informed him of the patient and her concerns. He advise that she should go ahead and have her flu shot.   Called patient back at this time and verbalized to patient that pre Dr. Genevive Bi she should go ahead and have her flu shot. Patient was thankful and verbalized understanding.

## 2017-05-15 ENCOUNTER — Other Ambulatory Visit: Payer: Self-pay | Admitting: Anesthesiology

## 2017-05-15 ENCOUNTER — Ambulatory Visit (HOSPITAL_BASED_OUTPATIENT_CLINIC_OR_DEPARTMENT_OTHER): Payer: BLUE CROSS/BLUE SHIELD | Admitting: Anesthesiology

## 2017-05-15 ENCOUNTER — Encounter: Payer: Self-pay | Admitting: Anesthesiology

## 2017-05-15 ENCOUNTER — Ambulatory Visit
Admission: RE | Admit: 2017-05-15 | Discharge: 2017-05-15 | Disposition: A | Discharge status: Home / Self Care | Payer: BLUE CROSS/BLUE SHIELD | Source: Ambulatory Visit | Attending: Anesthesiology | Admitting: Anesthesiology

## 2017-05-15 VITALS — BP 140/72 | HR 56 | Temp 97.2°F | Resp 18 | Ht 65.0 in | Wt 120.0 lb

## 2017-05-15 DIAGNOSIS — R1011 Right upper quadrant pain: Secondary | ICD-10-CM | POA: Diagnosis present

## 2017-05-15 DIAGNOSIS — G8929 Other chronic pain: Secondary | ICD-10-CM | POA: Insufficient documentation

## 2017-05-15 DIAGNOSIS — M546 Pain in thoracic spine: Secondary | ICD-10-CM | POA: Insufficient documentation

## 2017-05-15 DIAGNOSIS — G8912 Acute post-thoracotomy pain: Secondary | ICD-10-CM | POA: Diagnosis not present

## 2017-05-15 DIAGNOSIS — G588 Other specified mononeuropathies: Secondary | ICD-10-CM | POA: Insufficient documentation

## 2017-05-15 DIAGNOSIS — R52 Pain, unspecified: Secondary | ICD-10-CM

## 2017-05-15 MED ORDER — LACTATED RINGERS IV SOLN
1000.0000 mL | INTRAVENOUS | Status: DC
  Administered 2017-05-15: 1000 mL via INTRAVENOUS

## 2017-05-15 MED ORDER — LIDOCAINE HCL (PF) 1 % IJ SOLN
5.0000 mL | Freq: Once | INTRAMUSCULAR | Status: AC
  Administered 2017-05-15: 5 mL via SUBCUTANEOUS
  Filled 2017-05-15: qty 5

## 2017-05-15 MED ORDER — ROPIVACAINE HCL 2 MG/ML IJ SOLN
10.0000 mL | Freq: Once | INTRAMUSCULAR | Status: AC
  Administered 2017-05-15: 10 mL via EPIDURAL
  Filled 2017-05-15: qty 10

## 2017-05-15 MED ORDER — MIDAZOLAM HCL 5 MG/5ML IJ SOLN
5.0000 mg | Freq: Once | INTRAMUSCULAR | Status: AC
  Administered 2017-05-15: 2 mg via INTRAVENOUS
  Filled 2017-05-15: qty 5

## 2017-05-15 MED ORDER — TRIAMCINOLONE ACETONIDE 40 MG/ML IJ SUSP
40.0000 mg | Freq: Once | INTRAMUSCULAR | Status: AC
  Administered 2017-05-15: 40 mg
  Filled 2017-05-15: qty 1

## 2017-05-15 NOTE — Patient Instructions (Signed)
Pain Management Discharge Instructions  General Discharge Instructions :  If you need to reach your doctor call: Monday-Friday 8:00 am - 4:00 pm at [phone] or toll free [phone].  After clinic hours [phone] to have operator reach doctor.  Bring all of your medication bottles to all your appointments in the pain clinic.  To cancel or reschedule your appointment with Pain Management please remember to call 24 hours in advance to avoid a fee.  Refer to the educational materials which you have been given on: General Risks, I had my Procedure. Discharge Instructions, Post Sedation.  Post Procedure Instructions:  The drugs you were given will stay in your system until tomorrow, so for the next 24 hours you should not drive, make any legal decisions or drink any alcoholic beverages.  You may eat anything you prefer, but it is better to start with liquids then soups and crackers, and gradually work up to solid foods.  Please notify your doctor immediately if you have any unusual bleeding, trouble breathing or pain that is not related to your normal pain.  Depending on the type of procedure that was done, some parts of your body may feel week and/or numb.  This usually clears up by tonight or the next day.  Walk with the use of an assistive device or accompanied by an adult for the 24 hours.  You may use ice on the affected area for the first 24 hours.  Put ice in a Ziploc bag and cover with a towel and place against area 15 minutes on 15 minutes off.  You may switch to heat after 24 hours.Intercostal Nerve Block Patient Information  Description: The twelve intercostal nerves arise from the first thru twelfth thoracic nerve roots.  The nerve begins at the spine and wraps around the body, lying in a groove underneath each rib.  Each intercostal nerve innervates a specific strip of skin and body walk of the abdomen and chest.  Therefore, injuries of the chest wall or abdominal wall  result in pain that is transmitted back to the brian via the intercostal nerves.  Examples of such injuries include rib fractures and incisions for lung and gall bladder surgery.  Occasionally, pain may persist long after an injury or surgical incision secondary to inflammation and irritation of the intercostal nerve.  The longstanding pain is known as intercostal neuralgia.  An intercostal nerve block is preformed to eliminate pain either temporarily or permanently.  A small needle is placed below the rib and local anesthetic (like Novocaine) and possibly steroid is injected.  Usually 2-4 intercostal nerves are blocked at a time depending on the problem.  The patient will experience a slight "pin-prick" sensation for each injection.  Shortly thereafter, the strip of skin that is innervated by the blocked intercostal nerve will feel numb.  Persistent pain that is only temporarily relieved with local anesthetic may require a more permanent block. This procedure is called Cryoneurolysis and entails placing a small probe beneath the rib to freeze the nerve.  Conditions that may be treated by intercostal nerve blocks:   Rib fractures  Longstanding pain from surgery of the chest or abdomen (intercostal neuralgia)  Pain from chest tubes  Pain from trauma to the chest  Preparation for the injections:  1. Do not eat any solid food or dairy products within 8 hours of your appointment. 2. You may drink clear liquids up to 3 hours before appointment.  Clear liquids include water, black coffee, juice or soda.  No milk or cream please. 3. You may take your regular medication, including pain medications, with a sip of water before your appointment.  Diabetics should hold regular insulin (if take separately) and take 1/2 normal NPH dose the morning of the procedure.   Carry some sugar containing items with you to your appointment. 4. A driver must accompany you and be prepared to drive you home after your  procedure. 5. Bring all your current medications with you. 6. An IV may be inserted and sedation may be given at the discretion of the physician. 7. A blood pressure cuff, EKG and other monitors will often be applied during the procedure.  Some patients may need to have extra oxygen administered for a short period. 8. You will be asked to provide medical information, including your allergies, prior to the procedure.  We must know immediately if you are taking blood thinners (like Coumadin/Warfarin) or if you are allergic to IV iodine contrast (dye). We must know if you could possible be pregnant.  Possible side-effects:   Bleeding from needle site  Infection (rare)  Nerve injury (rare)  Numbness & tingling of skin  Collapsed lung requiring chest tube (rare)  Local anesthetic toxicity (rare)  Light-headedness (temporary)  Pain at injection site (several days)  Decreased blood pressure (temporary)  Shortness of breath  Jittery/shaking sensation (temporary)  Call if you experience:   Difficulty breathing or hives (go directly to the emergency room)  Redness, inflammation or drainage at the injection site  Severe pain at the site of the injection  Any new symptoms which are concerning   Please note:  Your pain may subside immediately but may return several hours after the injection.  Often, more than one injection is required to reduce the pain. Also, if several temporary blocks with local anesthetic are ineffective, a more permanent block with cryolysis may be necessary.  This will be discussed with you should this be the case.  If you have any questions, please call [phone] [facility]

## 2017-05-15 NOTE — Progress Notes (Signed)
Subjective:  Patient ID: Sheila Suarez, female    DOB: [birth year]  Age: 61 y.o. MRN: [id number]  CC: Abdominal Pain (left upper)   Procedure: Left side T7 T8 T9 intercostal nerve block under fluoroscopic guidance with moderate sedation   HPI [name] presents for reevaluation. 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 She is also describing a pain it's radiating into the left anterior region below her left breast. This is been a chronic area of pain. The back pain is been well managed with the intercostal nerve blocks. She mentions a 50-70% improvement generally lasting a few months after her injections. She is requiring no medication management additionally. Otherwise she is in her usual state of health.  In regards to the pain below her left breast she describes a numb area with occasional aching under the rib margin. No other symptoms are described. 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  Outpatient Medications Prior to Visit  Medication Sig Dispense Refill  . albuterol (PROVENTIL HFA;VENTOLIN HFA) 108 (90 Base) MCG/ACT inhaler Inhale into the lungs.    . ALPRAZolam (XANAX) 0.25 MG tablet Take by mouth.    . gabapentin (NEURONTIN) 100 MG capsule Take by mouth.    . linaclotide (LINZESS) 145 MCG CAPS capsule TAKE 1 CAPSULE (145 MCG TOTAL) BY MOUTH DAILY BEFORE BREAKFAST. 30 capsule 3  . meloxicam (MOBIC) 7.5 MG tablet daily.     Marland Kitchen omeprazole (PRILOSEC) 40 MG capsule TAKE 1 CAPSULE (40 MG TOTAL) BY MOUTH DAILY. 30 capsule 0  . ondansetron (ZOFRAN) 4 MG tablet Take 4 mg by mouth as needed.     . ranitidine (ZANTAC) 150 MG tablet Take 150 mg by mouth daily.     . sucralfate (CARAFATE) 1 g tablet   0  . traMADol (ULTRAM) 50 MG tablet Take 50 mg by mouth every 6 (six) hours as needed for moderate pain.    Marland Kitchen umeclidinium-vilanterol  (ANORO ELLIPTA) 62.5-25 MCG/INH AEPB Take by mouth.     No facility-administered medications prior to visit.     Review of Systems CNS: No confusion or sedation GI: No constipation or abdominal pain Pulmonary: No shortness of breath or photic symptoms  Objective:  BP 125/60   Pulse (!) 56   Temp (!) 97.2 F (36.2 C)   Resp 16   Ht 5\' 5"  (1.651 m)   Wt 120 lb (54.4 kg)   SpO2 100%   BMI 19.97 kg/m    BP Readings from Last 3 Encounters:  05/15/17 125/60  04/19/17 104/63  04/05/17 120/77     Wt Readings from Last 3 Encounters:  05/15/17 120 lb (54.4 kg)  04/19/17 119 lb 12.8 oz (54.3 kg)  04/05/17 121 lb 3.2 oz (55 kg)     Physical Exam Pt is alert and oriented PERRL EOMI HEART IS RRR no murmur or rub LCTA no wheezing or rhales MUSCULOSKELETAL reveals no evidence of palpable nodule below the left costal margin in the anterior mid clavicular line or below the left breast. She has a well-healed scar subcostal radiating from posterior to mid axillary region with some allodynia and hypalgesia  Labs  No results found for: HGBA1C Lab Results  Component Value Date   CREATININE 0.69 11/19/2016    -------------------------------------------------------------------------------------------------------------------- Lab Results  Component Value Date   WBC 8.9 11/19/2016   HGB 12.8  11/19/2016   HCT 39.0 11/19/2016   PLT 420 11/19/2016   GLUCOSE 82 11/19/2016   ALT 11 (L) 11/19/2016   AST 16 11/19/2016   NA 137 11/19/2016   K 3.7 11/19/2016   CL 105 11/19/2016   CREATININE 0.69 11/19/2016   BUN 9 11/19/2016   CO2 25 11/19/2016   TSH 0.635 02/09/2016   INR 0.91 03/28/2016    --------------------------------------------------------------------------------------------------------------------- Dg C-arm 1-60 Min-no Report  Result Date: 05/15/2017 Fluoroscopy was utilized by the requesting physician.  No radiographic interpretation.     Assessment & Plan:    [place] was seen today for abdominal pain.  Diagnoses and all orders for this visit:  Intercostal neuralgia -     triamcinolone acetonide (KENALOG-40) injection 40 mg; 1 mL (40 mg total) by Other route once. -     ropivacaine (PF) 2 mg/mL (0.2%) (NAROPIN) injection 10 mL; 10 mLs by Epidural route once. -     lidocaine (PF) (XYLOCAINE) 1 % injection 5 mL; Inject 5 mLs into the skin once. -     lactated ringers infusion 1,000 mL; Inject 1,000 mLs into the vein continuous. -     midazolam (VERSED) 5 MG/5ML injection 5 mg; Inject 5 mLs (5 mg total) into the vein once.  Acute postthoracotomy pain  Chronic left-sided thoracic back pain        ----------------------------------------------------------------------------------------------------------------------  Problem List Items Addressed This Visit    None    Visit Diagnoses    Intercostal neuralgia    -  Primary   Relevant Medications   triamcinolone acetonide (KENALOG-40) injection 40 mg (Completed)   ropivacaine (PF) 2 mg/mL (0.2%) (NAROPIN) injection 10 mL (Completed)   lidocaine (PF) (XYLOCAINE) 1 % injection 5 mL (Completed)   lactated ringers infusion 1,000 mL   midazolam (VERSED) 5 MG/5ML injection 5 mg (Completed)   Acute postthoracotomy pain       Chronic left-sided thoracic back pain       Relevant Medications   triamcinolone acetonide (KENALOG-40) injection 40 mg (Completed)   ropivacaine (PF) 2 mg/mL (0.2%) (NAROPIN) injection 10 mL (Completed)   lidocaine (PF) (XYLOCAINE) 1 % injection 5 mL (Completed)        ----------------------------------------------------------------------------------------------------------------------  1. Intercostal neuralgia We'll proceed with a repeat left side intercostal nerve block today as discussed with [place]. The risks and benefits been once again reviewed all questions answered. We'll have return to clinic in 4 months for reevaluation and possible repeat injection at that  time. In the meantime we'll want her to continue follow-up with her primary care physicians and I have requested that she follow up with Dr. Susy Manor who she describes as her oncology doctor for a baseline evaluation regarding her left side symptoms. - triamcinolone acetonide (KENALOG-40) injection 40 mg; 1 mL (40 mg total) by Other route once. - ropivacaine (PF) 2 mg/mL (0.2%) (NAROPIN) injection 10 mL; 10 mLs by Epidural route once. - lidocaine (PF) (XYLOCAINE) 1 % injection 5 mL; Inject 5 mLs into the skin once. - lactated ringers infusion 1,000 mL; Inject 1,000 mLs into the vein continuous. - midazolam (VERSED) 5 MG/5ML injection 5 mg; Inject 5 mLs (5 mg total) into the vein once.  2. Acute postthoracotomy pain As above  3. Chronic left-sided thoracic back pain As above    ----------------------------------------------------------------------------------------------------------------------  I am having Ms. Dority maintain her meloxicam, ondansetron, ranitidine, traMADol, linaclotide, sucralfate, albuterol, ALPRAZolam, umeclidinium-vilanterol, gabapentin, and omeprazole. We administered triamcinolone acetonide, ropivacaine (PF) 2 mg/mL (0.2%), lidocaine (PF), lactated  ringers, and midazolam.   Meds ordered this encounter  Medications  . triamcinolone acetonide (KENALOG-40) injection 40 mg  . ropivacaine (PF) 2 mg/mL (0.2%) (NAROPIN) injection 10 mL  . lidocaine (PF) (XYLOCAINE) 1 % injection 5 mL  . lactated ringers infusion 1,000 mL  . midazolam (VERSED) 5 MG/5ML injection 5 mg   Patient's Medications  New Prescriptions   No medications on file  Previous Medications   ALBUTEROL (PROVENTIL HFA;VENTOLIN HFA) 108 (90 BASE) MCG/ACT INHALER    Inhale into the lungs.   ALPRAZOLAM (XANAX) 0.25 MG TABLET    Take by mouth.   GABAPENTIN (NEURONTIN) 100 MG CAPSULE    Take by mouth.   LINACLOTIDE (LINZESS) 145 MCG CAPS CAPSULE    TAKE 1 CAPSULE (145 MCG TOTAL) BY MOUTH DAILY BEFORE  BREAKFAST.   MELOXICAM (MOBIC) 7.5 MG TABLET    daily.    OMEPRAZOLE (PRILOSEC) 40 MG CAPSULE    TAKE 1 CAPSULE (40 MG TOTAL) BY MOUTH DAILY.   ONDANSETRON (ZOFRAN) 4 MG TABLET    Take 4 mg by mouth as needed.    RANITIDINE (ZANTAC) 150 MG TABLET    Take 150 mg by mouth daily.    SUCRALFATE (CARAFATE) 1 G TABLET       TRAMADOL (ULTRAM) 50 MG TABLET    Take 50 mg by mouth every 6 (six) hours as needed for moderate pain.   UMECLIDINIUM-VILANTEROL (ANORO ELLIPTA) 62.5-25 MCG/INH AEPB    Take by mouth.  Modified Medications   No medications on file  Discontinued Medications   No medications on file   ---------------------------------------------------------------------------------------------------------------------- Procedure: Left side intercostal nerve block under fluoroscopic guidance with no sedation   Procedure: #4 Left side posterior thoracic intercostal nerve block at T7 T8 T9 with moderate sedation  Patient was taken to the fluoroscopy suite and placed in the prone position. IV sedation was utilized with 3 mg of Versed and this was well-tolerated and yielded moderate sedation. The area overlying the thoracic region was prepped with Betadine 3 after identifying the area overlying the T7 T8 T9 rib margins with fluoroscopic guidance. This was 5 cm to the left of midline. 1% lidocaine was infiltrated with a 25-gauge needle 1 cc to the subcutaneous region. I then advanced a 22-gauge 1.5 inch quickie needle to walk off the inferior posterior margin of the rib in the usual fashion. There was negative aspiration for heme or air. I then injected 3 cc of ropivacaine 0.2% mixed with 13 mg of triamcinolone at the aforementioned levels. The procedure was tolerated without difficulty and upon discharge she had resolution of her left thoracic pain vital signs were stable throughout the procedure and she is to return to clinic as indicated. Follow-up: Return in about 3 months (around 08/15/2017) for  procedure, evaluation.    Molli Barrows, MD

## 2017-05-15 NOTE — Progress Notes (Signed)
Nursing Pain Medication Assessment:  Safety precautions to be maintained throughout the outpatient stay will include: orient to surroundings, keep bed in low position, maintain call bell within reach at all times, provide assistance with transfer out of bed and ambulation.  Medication Inspection Compliance: Ms. Bashor did not comply with our request to bring her pills to be counted. She was reminded that bringing the medication bottles, even when empty, is a requirement.  Medication: None brought in. Pill/Patch Count: None available to be counted. Bottle Appearance: No container available. Did not bring bottle(s) to appointment. Filled Date: N/A Last Medication intake:  last week

## 2017-05-16 ENCOUNTER — Telehealth: Payer: Self-pay

## 2017-05-16 NOTE — Telephone Encounter (Signed)
Denies any needs at this time. Instructed to call if needed. 

## 2017-05-28 ENCOUNTER — Other Ambulatory Visit: Payer: Self-pay

## 2017-05-28 MED ORDER — OMEPRAZOLE 40 MG PO CPDR: 40.0000 mg | DELAYED_RELEASE_CAPSULE | Freq: Every day | ORAL | 0 refills | Status: DC

## 2017-08-01 DIAGNOSIS — I340 Nonrheumatic mitral (valve) insufficiency: Secondary | ICD-10-CM | POA: Insufficient documentation

## 2017-09-10 ENCOUNTER — Encounter: Payer: Self-pay | Admitting: Anesthesiology

## 2017-09-10 ENCOUNTER — Other Ambulatory Visit: Payer: Self-pay | Admitting: Anesthesiology

## 2017-09-10 ENCOUNTER — Ambulatory Visit
Admission: RE | Admit: 2017-09-10 | Discharge: 2017-09-10 | Disposition: A | Discharge status: Home / Self Care | Payer: BLUE CROSS/BLUE SHIELD | Source: Ambulatory Visit | Attending: Anesthesiology | Admitting: Anesthesiology

## 2017-09-10 ENCOUNTER — Ambulatory Visit (HOSPITAL_BASED_OUTPATIENT_CLINIC_OR_DEPARTMENT_OTHER): Payer: BLUE CROSS/BLUE SHIELD | Admitting: Anesthesiology

## 2017-09-10 ENCOUNTER — Other Ambulatory Visit: Payer: Self-pay

## 2017-09-10 VITALS — BP 137/98 | HR 55 | Temp 98.0°F | Resp 21 | Ht 65.0 in | Wt 120.0 lb

## 2017-09-10 DIAGNOSIS — G588 Other specified mononeuropathies: Secondary | ICD-10-CM | POA: Insufficient documentation

## 2017-09-10 DIAGNOSIS — Z79891 Long term (current) use of opiate analgesic: Secondary | ICD-10-CM | POA: Diagnosis not present

## 2017-09-10 DIAGNOSIS — Z791 Long term (current) use of non-steroidal anti-inflammatories (NSAID): Secondary | ICD-10-CM | POA: Diagnosis not present

## 2017-09-10 DIAGNOSIS — G8912 Acute post-thoracotomy pain: Secondary | ICD-10-CM | POA: Diagnosis not present

## 2017-09-10 DIAGNOSIS — G8929 Other chronic pain: Secondary | ICD-10-CM

## 2017-09-10 DIAGNOSIS — R0789 Other chest pain: Secondary | ICD-10-CM | POA: Insufficient documentation

## 2017-09-10 DIAGNOSIS — Z79899 Other long term (current) drug therapy: Secondary | ICD-10-CM | POA: Diagnosis not present

## 2017-09-10 DIAGNOSIS — R52 Pain, unspecified: Secondary | ICD-10-CM | POA: Diagnosis present

## 2017-09-10 DIAGNOSIS — M546 Pain in thoracic spine: Secondary | ICD-10-CM | POA: Diagnosis not present

## 2017-09-10 MED ORDER — ROPIVACAINE HCL 2 MG/ML IJ SOLN
10.0000 mL | Freq: Once | INTRAMUSCULAR | Status: AC
  Administered 2017-09-10: 10 mL via EPIDURAL
  Filled 2017-09-10: qty 10

## 2017-09-10 MED ORDER — LACTATED RINGERS IV SOLN
1000.0000 mL | INTRAVENOUS | Status: DC
  Administered 2017-09-10: 1000 mL via INTRAVENOUS

## 2017-09-10 MED ORDER — MIDAZOLAM HCL 5 MG/5ML IJ SOLN
5.0000 mg | Freq: Once | INTRAMUSCULAR | Status: AC
  Administered 2017-09-10: 3 mg via INTRAVENOUS
  Filled 2017-09-10: qty 5

## 2017-09-10 MED ORDER — LIDOCAINE HCL (PF) 1 % IJ SOLN
5.0000 mL | Freq: Once | INTRAMUSCULAR | Status: AC
  Administered 2017-09-10: 5 mL via SUBCUTANEOUS
  Filled 2017-09-10: qty 5

## 2017-09-10 MED ORDER — DEXAMETHASONE SODIUM PHOSPHATE 10 MG/ML IJ SOLN
10.0000 mg | Freq: Once | INTRAMUSCULAR | Status: AC
  Administered 2017-09-10: 10 mg
  Filled 2017-09-10: qty 1

## 2017-09-10 NOTE — Patient Instructions (Signed)

## 2017-09-10 NOTE — Progress Notes (Signed)
Safety precautions to be maintained throughout the outpatient stay will include: orient to surroundings, keep bed in low position, maintain call bell within reach at all times, provide assistance with transfer out of bed and ambulation.  

## 2017-09-11 ENCOUNTER — Telehealth: Payer: Self-pay | Admitting: *Deleted

## 2017-09-11 NOTE — Progress Notes (Signed)
Subjective:  Patient ID: Sheila Suarez, female    DOB: 1955-11-22  Age: 62 y.o. MRN: [id number]  CC: Pain (ribcage left-mid abck to front)   Procedure: Left T7-T8-T9 intercostal nerve block under fluoroscopic guidance with moderate sedation  HPI [name] presents for reevaluation.  She was last seen a few months ago and has had some recurrence of her left side chest wall pain.  In the past she has had intercostal nerve blocks that have given her approximately 75% improvement in the lateral wall and posterior chest pain.  She still getting some considerable left anterior subcostal pain.  The injections give her some transient relief in this type pain however the lateral rib pain and posterior rib pain have responded very favorably and generally get 3 months of relief with the injection therapy.  Otherwise she is been in her usual state of health at this time.  No new changes in pulmonary function are noted in the quality characteristic and distribution of the pain are as previously described  Outpatient Medications Prior to Visit  Medication Sig Dispense Refill  . albuterol (PROVENTIL HFA;VENTOLIN HFA) 108 (90 Base) MCG/ACT inhaler Inhale into the lungs.    . ALPRAZolam (XANAX) 0.25 MG tablet Take by mouth.    . linaclotide (LINZESS) 145 MCG CAPS capsule TAKE 1 CAPSULE (145 MCG TOTAL) BY MOUTH DAILY BEFORE BREAKFAST. 30 capsule 3  . meloxicam (MOBIC) 7.5 MG tablet daily.     Marland Kitchen omeprazole (PRILOSEC) 40 MG capsule Take 1 capsule (40 mg total) by mouth daily. 90 capsule 0  . gabapentin (NEURONTIN) 100 MG capsule Take by mouth.    . ondansetron (ZOFRAN) 4 MG tablet Take 4 mg by mouth as needed.     . ranitidine (ZANTAC) 150 MG tablet Take 150 mg by mouth daily.     . sucralfate (CARAFATE) 1 g tablet   0  . traMADol (ULTRAM) 50 MG tablet Take 50 mg by mouth every 6 (six) hours as needed for moderate pain.    Marland Kitchen umeclidinium-vilanterol (ANORO ELLIPTA) 62.5-25 MCG/INH AEPB Take by mouth.      No facility-administered medications prior to visit.     Review of Systems CNS: No confusion or sedation Cardiac: No angina or palpitations GI: No abdominal pain or constipation Constitutional: No nausea vomiting fever or chills  Objective:  BP (!) 137/98   Pulse (!) 55   Temp 98 F (36.7 C)   Resp (!) 21   Ht 5\' 5"  (1.651 m)   Wt 120 lb (54.4 kg)   SpO2 100%   BMI 19.97 kg/m    BP Readings from Last 3 Encounters:  09/10/17 (!) 137/98  05/15/17 140/72  04/19/17 104/63     Wt Readings from Last 3 Encounters:  09/10/17 120 lb (54.4 kg)  05/15/17 120 lb (54.4 kg)  04/19/17 119 lb 12.8 oz (54.3 kg)     Physical Exam Pt is alert and oriented PERRL EOMI HEART IS RRR no murmur or rub LCTA no wheezing or rales MUSCULOSKELETAL reveals some tenderness over the left costal margin approximately T7-T8 left side mid axillary line.  She also has tenderness over the posterior region under the left scapula.  Labs  No results found for: HGBA1C Lab Results  Component Value Date   CREATININE 0.69 11/19/2016    -------------------------------------------------------------------------------------------------------------------- Lab Results  Component Value Date   WBC 8.9 11/19/2016   HGB 12.8 11/19/2016   HCT 39.0 11/19/2016   PLT 420 11/19/2016   GLUCOSE 82  11/19/2016   ALT 11 (L) 11/19/2016   AST 16 11/19/2016   NA 137 11/19/2016   K 3.7 11/19/2016   CL 105 11/19/2016   CREATININE 0.69 11/19/2016   BUN 9 11/19/2016   CO2 25 11/19/2016   TSH 0.635 02/09/2016   INR 0.91 03/28/2016    --------------------------------------------------------------------------------------------------------------------- Dg C-arm 1-60 Min-no Report  Result Date: 09/10/2017 Fluoroscopy was utilized by the requesting physician.  No radiographic interpretation.     Assessment & Plan:   [place] was seen today for pain.  Diagnoses and all orders for this visit:  Acute  postthoracotomy pain  Intercostal neuralgia -     INTERCOSTAL NERVE BLOCK -     ropivacaine (PF) 2 mg/mL (0.2%) (NAROPIN) injection 10 mL -     midazolam (VERSED) 5 MG/5ML injection 5 mg -     lidocaine (PF) (XYLOCAINE) 1 % injection 5 mL -     lactated ringers infusion 1,000 mL -     dexamethasone (DECADRON) injection 10 mg  Chronic left-sided thoracic back pain        ----------------------------------------------------------------------------------------------------------------------  Problem List Items Addressed This Visit    None    Visit Diagnoses    Acute postthoracotomy pain    -  Primary   Intercostal neuralgia       Relevant Medications   ropivacaine (PF) 2 mg/mL (0.2%) (NAROPIN) injection 10 mL (Completed)   midazolam (VERSED) 5 MG/5ML injection 5 mg (Completed)   lidocaine (PF) (XYLOCAINE) 1 % injection 5 mL (Completed)   lactated ringers infusion 1,000 mL   dexamethasone (DECADRON) injection 10 mg (Completed)   Chronic left-sided thoracic back pain       Relevant Medications   ropivacaine (PF) 2 mg/mL (0.2%) (NAROPIN) injection 10 mL (Completed)   lidocaine (PF) (XYLOCAINE) 1 % injection 5 mL (Completed)   dexamethasone (DECADRON) injection 10 mg (Completed)        ----------------------------------------------------------------------------------------------------------------------  1. Intercostal neuralgia We will proceed with T7-T8-T9 left-sided intercostal nerve blocks under fluoroscopic guidance today.  We have gone over the risks and benefits of the procedure with her in full detail and all questions answered.  Return to clinic in approximately 1-2 months for reevaluation possible repeat injection if indicated.  Continue follow-up with her primary care physicians - INTERCOSTAL NERVE BLOCK - ropivacaine (PF) 2 mg/mL (0.2%) (NAROPIN) injection 10 mL - midazolam (VERSED) 5 MG/5ML injection 5 mg - lidocaine (PF) (XYLOCAINE) 1 % injection 5 mL - lactated  ringers infusion 1,000 mL - dexamethasone (DECADRON) injection 10 mg  2. Acute postthoracotomy pain   3. Chronic left-sided thoracic back pain We have talked about options regarding ablation of the intercostal nerves however she is unaccepting of this.  She reports that she is gotten good relief from these injections in the past.  Want her to continue her other baseline medications as well.  Continue stretching strengthening exercises    ----------------------------------------------------------------------------------------------------------------------  I am having [place] maintain her meloxicam, ondansetron, ranitidine, traMADol, linaclotide, sucralfate, albuterol, ALPRAZolam, umeclidinium-vilanterol, gabapentin, and omeprazole. We administered ropivacaine (PF) 2 mg/mL (0.2%), midazolam, lidocaine (PF), lactated ringers, and dexamethasone.   Meds ordered this encounter  Medications  . ropivacaine (PF) 2 mg/mL (0.2%) (NAROPIN) injection 10 mL  . midazolam (VERSED) 5 MG/5ML injection 5 mg  . lidocaine (PF) (XYLOCAINE) 1 % injection 5 mL  . lactated ringers infusion 1,000 mL  . dexamethasone (DECADRON) injection 10 mg   Patient's Medications  New Prescriptions   No medications on file  Previous Medications   ALBUTEROL (PROVENTIL HFA;VENTOLIN HFA) 108 (90 BASE) MCG/ACT INHALER    Inhale into the lungs.   ALPRAZOLAM (XANAX) 0.25 MG TABLET    Take by mouth.   GABAPENTIN (NEURONTIN) 100 MG CAPSULE    Take by mouth.   LINACLOTIDE (LINZESS) 145 MCG CAPS CAPSULE    TAKE 1 CAPSULE (145 MCG TOTAL) BY MOUTH DAILY BEFORE BREAKFAST.   MELOXICAM (MOBIC) 7.5 MG TABLET    daily.    OMEPRAZOLE (PRILOSEC) 40 MG CAPSULE    Take 1 capsule (40 mg total) by mouth daily.   ONDANSETRON (ZOFRAN) 4 MG TABLET    Take 4 mg by mouth as needed.    RANITIDINE (ZANTAC) 150 MG TABLET    Take 150 mg by mouth daily.    SUCRALFATE (CARAFATE) 1 G TABLET       TRAMADOL (ULTRAM) 50 MG TABLET    Take 50 mg  by mouth every 6 (six) hours as needed for moderate pain.   UMECLIDINIUM-VILANTEROL (ANORO ELLIPTA) 62.5-25 MCG/INH AEPB    Take by mouth.  Modified Medications   No medications on file  Discontinued Medications   No medications on file   ----------------------------------------------------------------------------------------------------------------------  Follow-up: Return in about 2 months (around 11/08/2017) for procedure, evaluation.   Left intercostal nerve block under fluoroscopic guidance at T7-T8-T9: After informed consent was obtained and the risks and benefits reviewed the patient chose to pursue repeat intercostal nerve blocks for left-sided rib pain.  She is placed in the prone position in the fluoroscopy suite with sedation via IV Versed.  Under fluoroscopic guidance identified the T7-T8-T9 ribs approximately 5 cm to the left of midline.  This area was prepped with Betadine x3.  I then used a 25-gauge needle to inject 1 cc of lidocaine subcu overlying each of the after mentioned ribs.  Then a 22-gauge 1/2 inch needle was used to walk off the posterior inferior aspect of the rib margin with the assistance of fluoroscopic guidance.  There is negative aspiration for heme or CSF and a dose of 2 cc of 0.2% ropivacaine mixed with 15 mg of triamcinolone was injected at T7-T8 and ultimately T9.  These needles were removed and the patient was able to tolerate the procedure without difficulty.  She was convalesced discharged home with a noted reduction in pain. Molli Barrows, MD

## 2017-09-11 NOTE — Telephone Encounter (Signed)
Denies problems post procedure. 

## 2017-09-19 DIAGNOSIS — Z79891 Long term (current) use of opiate analgesic: Secondary | ICD-10-CM | POA: Insufficient documentation

## 2017-09-19 DIAGNOSIS — G8922 Chronic post-thoracotomy pain: Secondary | ICD-10-CM | POA: Insufficient documentation

## 2017-11-14 ENCOUNTER — Encounter: Payer: Self-pay | Admitting: Gastroenterology

## 2017-11-14 ENCOUNTER — Ambulatory Visit: Payer: BLUE CROSS/BLUE SHIELD | Admitting: Gastroenterology

## 2017-11-14 VITALS — BP 112/71 | HR 56 | Ht 65.0 in | Wt 117.6 lb

## 2017-11-14 DIAGNOSIS — K2970 Gastritis, unspecified, without bleeding: Secondary | ICD-10-CM | POA: Diagnosis not present

## 2017-11-14 DIAGNOSIS — K581 Irritable bowel syndrome with constipation: Secondary | ICD-10-CM | POA: Diagnosis not present

## 2017-11-14 DIAGNOSIS — R1013 Epigastric pain: Secondary | ICD-10-CM

## 2017-11-14 MED ORDER — OMEPRAZOLE 40 MG PO CPDR: 40.0000 mg | DELAYED_RELEASE_CAPSULE | Freq: Every day | ORAL | 0 refills | Status: DC

## 2017-11-14 MED ORDER — SUCRALFATE 1 G PO TABS: 1.0000 g | ORAL_TABLET | Freq: Four times a day (QID) | ORAL | 0 refills | Status: DC

## 2017-11-14 NOTE — Progress Notes (Signed)
[name] MD, MRCP(U.K) [address]  [place]  [place], [place] [address]  Main: [phone]  Fax: [phone]   Primary Care Physician: Letta Median, MD  Primary Gastroenterologist:  Dr. [name]   No chief complaint on file.   HPI: Sheila Suarez is a 62 y.o. female   She is here to see me for abdominal pain.  Summary of history : She has a history of abdominal pain ongoing for many years, upper abdomen, severe constipation and relief after a bowel movement . She has been on narcotics. She had a normal colonoscopy on 06/02/2016 and an EGD which appeared normal but biopsies taken showed active chronic gastritis with H pylori .triple therapy prescribed on 06/16/16 .completed treatment for H pylori.Eradication confirmed subsequently.  She is a S/p left thoracotomy and LUL lobectomy .LFT's normal in 05/2016 . PET scan in 01/2016 showed only some mild hypermetabolism in the stomach and rest of GI tract was negative.On linzess 145 mcg  Interval history 03/2017 -11/14/2017  She says everything was good for a while, but for the past few months has had abdominal swelling after meals.   She has a bowel movement once in two weeks, the linzess stopped working . She takes Meloxicam and recalls all her abdominal pain began after commencing it for arthritis.   She is taking Omeprazole- she stopped taking it a few months back. Recalls "that really helped me"  Current Outpatient Medications  Medication Sig Dispense Refill  . albuterol (PROVENTIL HFA;VENTOLIN HFA) 108 (90 Base) MCG/ACT inhaler Inhale into the lungs.    . ALPRAZolam (XANAX) 0.25 MG tablet Take by mouth.    . gabapentin (NEURONTIN) 100 MG capsule Take by mouth.    . linaclotide (LINZESS) 145 MCG CAPS capsule TAKE 1 CAPSULE (145 MCG TOTAL) BY MOUTH DAILY BEFORE BREAKFAST. 30 capsule 3  . meloxicam (MOBIC) 7.5 MG tablet daily.     Marland Kitchen omeprazole (PRILOSEC) 40 MG capsule Take 1 capsule (40 mg total) by mouth  daily. 90 capsule 0  . ondansetron (ZOFRAN) 4 MG tablet Take 4 mg by mouth as needed.     . ranitidine (ZANTAC) 150 MG tablet Take 150 mg by mouth daily.     . sucralfate (CARAFATE) 1 g tablet   0  . traMADol (ULTRAM) 50 MG tablet Take 50 mg by mouth every 6 (six) hours as needed for moderate pain.    Marland Kitchen umeclidinium-vilanterol (ANORO ELLIPTA) 62.5-25 MCG/INH AEPB Take by mouth.     No current facility-administered medications for this visit.     Allergies as of 11/14/2017  . (No Known Allergies)    ROS:  General: Negative for anorexia, weight loss, fever, chills, fatigue, weakness. ENT: Negative for hoarseness, difficulty swallowing , nasal congestion. CV: Negative for chest pain, angina, palpitations, dyspnea on exertion, peripheral edema.  Respiratory: Negative for dyspnea at rest, dyspnea on exertion, cough, sputum, wheezing.  GI: See history of present illness. GU:  Negative for dysuria, hematuria, urinary incontinence, urinary frequency, nocturnal urination.  Endo: Negative for unusual weight change.    Physical Examination:   There were no vitals taken for this visit.  General: Well-nourished, well-developed in no acute distress.  Eyes: No icterus. Conjunctivae pink. Mouth: Oropharyngeal mucosa moist and pink , no lesions erythema or exudate. Lungs: Clear to auscultation bilaterally. Non-labored. Heart: Regular rate and rhythm, no murmurs rubs or gallops.  Abdomen: Bowel sounds are normal, nontender, nondistended, no hepatosplenomegaly or masses, no abdominal bruits or hernia ,  no rebound or guarding.   Extremities: No lower extremity edema. No clubbing or deformities. Neuro: Alert and oriented x 3.  Grossly intact. Skin: Warm and dry, no jaundice.   Psych: Alert and cooperative, normal mood and affect.   Imaging Studies: No results found.  Assessment and Plan:   Mellisa Suarez is a 62 y.o. y/o female here for follow up for IBS-C and GERD. Since her last visit  she started on Meloxicam and developed pain after meals suggestive of a gastric mucosal injury from NSAID's . She has stopped her PPI . Presently she is also constipated as she says the linzess stopped working    1. Abdominal pain : Restart PPI, script sent, stop meloxicam , script sent for carafate  2. Irritable bowel syndrome with constipation -Trial of Trulance - 2 weeks samples provided  Dr [name]  MD,MRCP [facility]) Follow up in 6-8 weeks

## 2017-11-20 ENCOUNTER — Telehealth: Payer: Self-pay | Admitting: Gastroenterology

## 2017-11-20 NOTE — Telephone Encounter (Signed)
*  STAT* If patient is at the pharmacy, call can be transferred to refill team.   1. Which medications need to be refilled? (please list name of each medication and dose if known) Trulance 3 mg  2. Which pharmacy/location (including street and city if local pharmacy) is medication to be sent to? [facility]  3. Do they need a 30 day or 90 day supply? 90 day

## 2017-11-21 ENCOUNTER — Other Ambulatory Visit: Payer: Self-pay

## 2017-11-21 DIAGNOSIS — K581 Irritable bowel syndrome with constipation: Secondary | ICD-10-CM

## 2017-11-21 MED ORDER — PLECANATIDE 3 MG PO TABS: 3.0000 mg | ORAL_TABLET | Freq: Every day | ORAL | 0 refills | Status: DC

## 2017-11-28 ENCOUNTER — Other Ambulatory Visit: Payer: Self-pay | Admitting: Gastroenterology

## 2017-11-28 ENCOUNTER — Other Ambulatory Visit: Payer: Self-pay

## 2017-11-28 DIAGNOSIS — K5900 Constipation, unspecified: Secondary | ICD-10-CM

## 2017-11-28 MED ORDER — LACTULOSE 20 G PO PACK: 20.0000 g | PACK | Freq: Three times a day (TID) | ORAL | 0 refills | Status: AC

## 2017-11-30 ENCOUNTER — Other Ambulatory Visit: Payer: Self-pay

## 2017-11-30 DIAGNOSIS — K581 Irritable bowel syndrome with constipation: Secondary | ICD-10-CM

## 2017-11-30 MED ORDER — POLYETHYLENE GLYCOL 3350 17 GM/SCOOP PO POWD: 1.0000 | Freq: Every day | ORAL | 3 refills | Status: DC

## 2017-12-04 ENCOUNTER — Ambulatory Visit: Payer: BLUE CROSS/BLUE SHIELD | Admitting: Internal Medicine

## 2017-12-04 ENCOUNTER — Ambulatory Visit: Payer: BLUE CROSS/BLUE SHIELD | Admitting: Anesthesiology

## 2017-12-06 ENCOUNTER — Ambulatory Visit (INDEPENDENT_AMBULATORY_CARE_PROVIDER_SITE_OTHER): Payer: BLUE CROSS/BLUE SHIELD | Admitting: Internal Medicine

## 2017-12-06 ENCOUNTER — Encounter: Payer: Self-pay | Admitting: Internal Medicine

## 2017-12-06 VITALS — BP 110/70 | HR 57 | Ht 65.0 in | Wt 116.0 lb

## 2017-12-06 DIAGNOSIS — J4520 Mild intermittent asthma, uncomplicated: Secondary | ICD-10-CM | POA: Diagnosis not present

## 2017-12-06 MED ORDER — ALBUTEROL SULFATE HFA 108 (90 BASE) MCG/ACT IN AERS: 1.0000 | INHALATION_SPRAY | RESPIRATORY_TRACT | 12 refills | Status: DC | PRN

## 2017-12-06 MED ORDER — UMECLIDINIUM-VILANTEROL 62.5-25 MCG/INH IN AEPB: 1.0000 | INHALATION_SPRAY | Freq: Every day | RESPIRATORY_TRACT | 12 refills | Status: DC

## 2017-12-06 NOTE — Progress Notes (Signed)
[facility] Consultation      Date: 12/06/2017,   MRN# [id number] Sheila Suarez 23-Jul-1956     AdmissionWeight: 116 lb (52.6 kg)                 CurrentWeight: 116 lb (52.6 kg) Sheila Suarez is a 62 y.o. old female seen in consultation for SOB at the request of Dr. Genevive Bi     CHIEF COMPLAINT:   SOB. Follow up COPD   HISTORY OF PRESENT ILLNESS   66 AAF with chronic tobacco abuse Quit smoking 03/2016 Had dx of LUL nodule since 2012 Underwent LUL lung resection by Dr Genevive Bi 03/2016 as requested by oncology Benign findings per path reprots  She is very anxious and and still has chest wall pain She states that she is having chest pain  Her ONO and 6MWT WNL no evidence of hypoxia CT chest shows no significant findings except some mild emphysematous changes Has chronic SOB and DOE but stable Doing well with inhaler therapy   Also with chest wall pain since surgery Has seen pain clinic for and was told she can get injection BUT has refused  She has no signs of infection at this time  I have advised her to call her Cardiologist PFT ratio [id number]%-[id number] 435predicted mild obstruction Moderate restriction-NL DLCO when corrected for volumes  PFT PFT   Current Medication:  Current Outpatient Medications:  .  albuterol (PROVENTIL HFA;VENTOLIN HFA) 108 (90 Base) MCG/ACT inhaler, Inhale into the lungs., Disp: , Rfl:  .  amoxicillin (AMOXIL) 500 MG capsule, 3 (three) times a day, Disp: , Rfl:  .  gabapentin (NEURONTIN) 100 MG capsule, Take by mouth., Disp: , Rfl:  .  lactulose (CEPHULAC) 20 g packet, Take 1 packet (20 g total) by mouth 3 (three) times daily., Disp: 30 each, Rfl: 0 .  omeprazole (PRILOSEC) 40 MG capsule, Take 1 capsule (40 mg total) by mouth daily for 90 doses., Disp: 90 capsule, Rfl: 0 .  ondansetron (ZOFRAN) 4 MG tablet, Take 4 mg by mouth as needed. , Disp: , Rfl:  .  polyethylene glycol powder (GLYCOLAX/MIRALAX) powder, Take 255 g by mouth  daily., Disp: 255 g, Rfl: 3 .  ranitidine (ZANTAC) 150 MG tablet, Take 150 mg by mouth daily. , Disp: , Rfl:  .  sucralfate (CARAFATE) 1 g tablet, Take 1 tablet (1 g total) by mouth 4 (four) times daily., Disp: 244 tablet, Rfl: 0 .  traMADol (ULTRAM) 50 MG tablet, Take 50 mg by mouth every 6 (six) hours as needed for moderate pain., Disp: , Rfl:  .  umeclidinium-vilanterol (ANORO ELLIPTA) 62.5-25 MCG/INH AEPB, Take by mouth., Disp: , Rfl:     ALLERGIES   Patient has no known allergies.     REVIEW OF SYSTEMS   Review of Systems  Constitutional: Negative for chills, diaphoresis, fever, malaise/fatigue and weight loss.  HENT: Negative for congestion and hearing loss.   Eyes: Negative for blurred vision and double vision.  Respiratory: Positive for shortness of breath and wheezing. Negative for cough, hemoptysis and sputum production.   Cardiovascular: Positive for chest pain. Negative for palpitations, orthopnea and leg swelling.  Gastrointestinal: Negative for abdominal pain, heartburn, nausea and vomiting.  Genitourinary: Negative for dysuria and urgency.  Musculoskeletal: Negative for back pain, myalgias and neck pain.  Skin: Negative for rash.  Neurological: Negative for dizziness, tingling, tremors, weakness and headaches.  Endo/Heme/Allergies: Does not bruise/bleed easily.  Psychiatric/Behavioral: The patient is nervous/anxious.   All other systems  reviewed and are negative.    VS: BP 110/70 (BP Location: Left Arm, Cuff Size: Normal)   Pulse (!) 57   Ht 5\' 5"  (1.651 m)   Wt 116 lb (52.6 kg)   SpO2 99%   BMI 19.30 kg/m      PHYSICAL EXAM  Physical Exam  Constitutional: She is oriented to person, place, and time. She appears well-developed and well-nourished. No distress.  HENT:  Head: Normocephalic and atraumatic.  Mouth/Throat: No oropharyngeal exudate.  Eyes: Pupils are equal, round, and reactive to light. EOM are normal. No scleral icterus.  Neck: Normal range  of motion. Neck supple.  Cardiovascular: Normal rate, regular rhythm and normal heart sounds.  No murmur heard. Pulmonary/Chest: No stridor. No respiratory distress. She has no wheezes.  Left chest wall pain with palpation  Abdominal: Soft. Bowel sounds are normal.  Musculoskeletal: Normal range of motion. She exhibits no edema.  Neurological: She is alert and oriented to person, place, and time. No cranial nerve deficit.  Skin: Skin is warm. She is not diaphoretic.  Psychiatric: She has a normal mood and affect.      IMAGING  CT chest 10/2016 I have Independently reviewed images of  CT chest    Interpretation:no acute findings, no effusions, no massess  PFT 08/29/16  Mod restrictive Lung disease FVC 63% predicted 2L FEF 25/75 53% predicted  Mixed mild obstructive small airways disease     ASSESSMENT/PLAN  62 thin AAF seen today for SOB with exertion with h/o chronic tobacco abuse with h/o LUL nodule s/p left upper lobe Lung resection with benign findings with signs and symptoms of reactive airways disease with small airways obstruction(COPD) and left chest wall pain  Her ONO and 6MWT were WNL, her level of SOB does not correlate with CT findings or PFT findings  1.continue ANORO for mild COPD 2.patient advised to follow up with pain clinic-Dr Andree Elk  No signs of infection at this time continue inhalers as prescribed I have explained to patient to pace her self with exertional work She seems to understand that she will not be able to physically do the things she used to do.   Follow up in 1 year   Patient  satisfied with Plan of action and management. All questions answered  Corrin Parker, M.D.  [facility]  Medical Director [place] Director [facility] Cardio-Pulmonary Department

## 2017-12-20 ENCOUNTER — Ambulatory Visit
Admission: RE | Admit: 2017-12-20 | Discharge: 2017-12-20 | Disposition: A | Discharge status: Home / Self Care | Payer: BLUE CROSS/BLUE SHIELD | Source: Ambulatory Visit | Attending: Anesthesiology | Admitting: Anesthesiology

## 2017-12-20 ENCOUNTER — Encounter: Payer: Self-pay | Admitting: Anesthesiology

## 2017-12-20 ENCOUNTER — Other Ambulatory Visit: Payer: Self-pay

## 2017-12-20 ENCOUNTER — Other Ambulatory Visit: Payer: Self-pay | Admitting: Anesthesiology

## 2017-12-20 ENCOUNTER — Ambulatory Visit (HOSPITAL_BASED_OUTPATIENT_CLINIC_OR_DEPARTMENT_OTHER): Payer: BLUE CROSS/BLUE SHIELD | Admitting: Anesthesiology

## 2017-12-20 VITALS — BP 126/72 | HR 54 | Temp 97.7°F | Resp 16 | Ht 65.0 in | Wt 119.0 lb

## 2017-12-20 DIAGNOSIS — R52 Pain, unspecified: Secondary | ICD-10-CM

## 2017-12-20 DIAGNOSIS — G8929 Other chronic pain: Secondary | ICD-10-CM | POA: Insufficient documentation

## 2017-12-20 DIAGNOSIS — G8912 Acute post-thoracotomy pain: Secondary | ICD-10-CM | POA: Insufficient documentation

## 2017-12-20 DIAGNOSIS — M546 Pain in thoracic spine: Secondary | ICD-10-CM

## 2017-12-20 DIAGNOSIS — M792 Neuralgia and neuritis, unspecified: Secondary | ICD-10-CM | POA: Insufficient documentation

## 2017-12-20 DIAGNOSIS — G588 Other specified mononeuropathies: Secondary | ICD-10-CM | POA: Diagnosis not present

## 2017-12-20 DIAGNOSIS — M549 Dorsalgia, unspecified: Secondary | ICD-10-CM | POA: Diagnosis present

## 2017-12-20 MED ORDER — BUPIVACAINE-EPINEPHRINE (PF) 0.25% -1:200000 IJ SOLN
INTRAMUSCULAR | Status: AC
  Filled 2017-12-20: qty 30

## 2017-12-20 MED ORDER — BUPIVACAINE-EPINEPHRINE 0.25% -1:200000 IJ SOLN
4.0000 mL | Freq: Once | INTRAMUSCULAR | Status: AC
  Administered 2017-12-20: 4 mL

## 2017-12-20 MED ORDER — TRIAMCINOLONE ACETONIDE 40 MG/ML IJ SUSP
40.0000 mg | Freq: Once | INTRAMUSCULAR | Status: DC
  Filled 2017-12-20: qty 1

## 2017-12-20 MED ORDER — MIDAZOLAM HCL 5 MG/5ML IJ SOLN
INTRAMUSCULAR | Status: AC
  Filled 2017-12-20: qty 5

## 2017-12-20 MED ORDER — DEXAMETHASONE SODIUM PHOSPHATE 10 MG/ML IJ SOLN
INTRAMUSCULAR | Status: AC
  Filled 2017-12-20: qty 1

## 2017-12-20 MED ORDER — DEXAMETHASONE SODIUM PHOSPHATE 10 MG/ML IJ SOLN
10.0000 mg | Freq: Once | INTRAMUSCULAR | Status: AC
  Administered 2017-12-20: 10 mg via INTRA_ARTICULAR

## 2017-12-20 MED ORDER — MIDAZOLAM HCL 2 MG/2ML IJ SOLN
5.0000 mg | Freq: Once | INTRAMUSCULAR | Status: AC
  Administered 2017-12-20: 5 mg via INTRAVENOUS

## 2017-12-20 MED ORDER — ROPIVACAINE HCL 2 MG/ML IJ SOLN: 10.0000 mL | Freq: Once | INTRAMUSCULAR | Status: DC

## 2017-12-20 NOTE — Patient Instructions (Signed)
Post-procedure Information What to expect: Most procedures involve the use of a local anesthetic (numbing medicine), and a steroid (anti-inflammatory medicine).  The local anesthetics may cause temporary numbness and weakness of the legs or arms, depending on the location of the block. This numbness/weakness may last 4-6 hours, depending on the local anesthetic used. In rare instances, it can last up to 24 hours. While numb, you must be very careful not to injure the extremity.  After any procedure, you could expect the pain to get better within 15-20 minutes. This relief is temporary and may last 4-6 hours. Once the local anesthetics wears off, you could experience discomfort, possibly more than usual, for up to 10 (ten) days. In the case of radiofrequencies, it may last up to 6 weeks. Surgeries may take up to 8 weeks for the healing process. The discomfort is due to the irritation caused by needles going through skin and muscle. To minimize the discomfort, we recommend using ice the first day, and heat from then on. The ice should be applied for 15 minutes on, and 15 minutes off. Keep repeating this cycle until bedtime. Avoid applying the ice directly to the skin, to prevent frostbite. Heat should be used daily, until the pain improves (4-10 days). Be careful not to burn yourself.  Occasionally you may experience muscle spasms or cramps. These occur as a consequence of the irritation caused by the needle sticks to the muscle and the blood that will inevitably be lost into the surrounding muscle tissue. Blood tends to be very irritating to tissues, which tend to react by going into spasm. These spasms may start the same day of your procedure, but they may also take days to develop. This late onset type of spasm or cramp is usually caused by electrolyte imbalances triggered by the steroids, at the level of the kidney. Cramps and spasms tend to respond well to muscle relaxants, multivitamins (some are  triggered by the procedure, but may have their origins in vitamin deficiencies), and "Gatorade", or any sports drinks that can replenish any electrolyte imbalances. (If you are a diabetic, ask your pharmacist to get you a sugar-free brand.) Warm showers or baths may also be helpful. Stretching exercises are highly recommended. General Instructions:  Be alert for signs of possible infection: redness, swelling, heat, red streaks, elevated temperature, and/or fever. These typically appear 4 to 6 days after the procedure. Immediately notify your doctor if you experience unusual bleeding, difficulty breathing, or loss of bowel or bladder control. If you experience increased pain, do not increase your pain medicine intake, unless instructed by your pain physician. Post-Procedure Care:  Be careful in moving about. Muscle spasms in the area of the injection may occur. Applying ice or heat to the area is often helpful. The incidence of spinal headaches after epidural injections ranges between 1.4% and 6%. If you develop a headache that does not seem to respond to conservative therapy, please let your physician know. This can be treated with an epidural blood patch.   Post-procedure numbness or redness is to be expected, however it should average 4 to 6 hours. If numbness and weakness of your extremities begins to develop 4 to 6 hours after your procedure, and is felt to be progressing and worsening, immediately contact your physician.   Diet:  If you experience nausea, do not eat until this sensation goes away. If you had a "Stellate Ganglion Block" for upper extremity "Reflex Sympathetic Dystrophy", do not eat or drink until your   hoarseness goes away. In any case, always start with liquids first and if you tolerate them well, then slowly progress to more solid foods. Activity:  For the first 4 to 6 hours after the procedure, use caution in moving about as you may experience numbness and/or weakness. Use caution in  cooking, using household electrical appliances, and climbing steps. If you need to reach your Doctor call our office: [phone] Monday-Thursday 8:00 am - 4:00 PM    Fridays: Closed     In case of an emergency: In case of emergency, call 911 or go to the nearest emergency room and have the physician there call us.  Interpretation of Procedure Every nerve block has two components: a diagnostic component, and a treatment component. Unrealistic expectations are the most common causes of "perceived failure".  In a perfect world, a single nerve block should be able to completely and permanently eliminate the pain. Sadly, the world is not perfect.  Most pain management nerve blocks are performed using local anesthetics and steroids. Steroids are responsible for any long-term benefit that you may experience. Their purpose is to decrease any chronic swelling that may exist in the area. Steroids begin to work immediately after being injected. However, most patients will not experience any benefits until 5 to 10 days after the injection, when the swelling has come down to the point where they can tell a difference. Steroids will only help if there is swelling to be treated. As such, they can assist with the diagnosis. If effective, they suggest an inflammatory component to the pain, and if ineffective, they rule out inflammation as the main cause or component of the problem. If the problem is one of mechanical compression, you will get no benefit from those steroids.   In the case of local anesthetics, they have a crucial role in the diagnosis of your condition. Most will begin to work within15 to 20 minutes after injection. The duration will depend on the type used (short- vs. Long-acting). It is of outmost importance that patients keep tract of their pain, after the procedure. To assist with this matter, a "Post-procedure Pain Diary" is provided. Make sure to complete it and to bring it back to your  follow-up appointment.  As long as the patient keeps accurate, detailed records of their symptoms after every procedure, and returns to have those interpreted, every procedure will provide us with invaluable information. Even a block that does not provide the patient with any relief, will always provide us with information about the mechanism and the origin of the pain. The only time a nerve block can be considered a waste of time is when patients do not keep track of the results, or do not keep their post-procedure appointment.  Reporting the results back to your physician The Pain Score  Pain is a subjective complaint. It cannot be seen, touched, or measured. We depend entirely on the patient's report of the pain in order to assess your condition and treatment. To evaluate the pain, we use a pain scale, where "0" means "No Pain", and a "10" is "the worst possible pain that you can even imagine" (i.e. something like been eaten alive by a shark or being torn apart by a lion).   You will frequently be asked to rate your pain. Please be as accurate, remember that medical decisions will be based on your responses. Please do not rate your pain above a 10. Doing so is actually interpreted as "symptom magnification" (exaggeration), as   well as lack of understanding with regards to the scale. To put this into perspective, when you tell us that your pain is at a 10 (ten), what you are saying is that there is nothing we can do to make this pain any worse. (Carefully think about that.) Preparing for Procedure with Sedation Instructions: . Oral Intake: Do not eat or drink anything for at least 8 hours prior to your procedure. . Transportation: Public transportation is not allowed. Bring an adult driver. The driver must be physically present in our waiting room before any procedure can be started. . Physical Assistance: Bring an adult capable of physically assisting you, in the event you need help. . Blood Pressure  Medicine: Take your blood pressure medicine with a sip of water the morning of the procedure. . Insulin: Take only  of your normal insulin dose. . Preventing infections: Shower with an antibacterial soap the morning of your procedure. . Build-up your immune system: Take 1000 mg of Vitamin C with every meal (3 times a day) the day prior to your procedure. . Pregnancy: If you are pregnant, call and cancel the procedure. . Sickness: If you have a cold, fever, or any active infections, call and cancel the procedure. . Arrival: You must be in the facility at least 30 minutes prior to your scheduled procedure. . Children: Do not bring children with you. . Dress appropriately: Bring dark clothing that you would not mind if they get stained. . Valuables: Do not bring any jewelry or valuables. Procedure appointments are reserved for interventional treatments only. . No Prescription Refills. . No medication changes will be discussed during procedure appointments. . No disability issues will be discussed. .  

## 2017-12-20 NOTE — Progress Notes (Signed)
Subjective:  Patient ID: Sheila Suarez, female    DOB: 12-14-1955  Age: 62 y.o. MRN: [id number]  CC: Procedure (ICNB) and Back Pain   Procedure: Left T7-T8-T9 intercostal nerve block under fluoroscopic guidance with moderate sedation  HPI [name] presents for reevaluation.  She was last seen approximately 4 months ago and has had some gradual recurrence of the same quality characteristic distribution of left-sided thoracic pain.  In the past she has had intercostal nerve blocks and generally she gets 75 to 80% relief lasting about 2 to 3 months.  She then has a gradual recurrence return of the similar type pain.  No new changes in upper or lower extremity strength are noted no changes in pulmonary [place]er status are noted.  The quality the pain is stable in nature.  She still using periodic gabapentin and this seems to help with sleep and pain control as well.  Outpatient Medications Prior to Visit  Medication Sig Dispense Refill  . albuterol (PROVENTIL HFA;VENTOLIN HFA) 108 (90 Base) MCG/ACT inhaler Inhale 1-2 puffs into the lungs every 4 (four) hours as needed for wheezing or shortness of breath. 1 Inhaler 12  . lactulose (CEPHULAC) 20 g packet Take 1 packet (20 g total) by mouth 3 (three) times daily. 30 each 0  . omeprazole (PRILOSEC) 40 MG capsule Take 1 capsule (40 mg total) by mouth daily for 90 doses. 90 capsule 0  . ondansetron (ZOFRAN) 4 MG tablet Take 4 mg by mouth as needed.     . polyethylene glycol powder (GLYCOLAX/MIRALAX) powder Take 255 g by mouth daily. 255 g 3  . sucralfate (CARAFATE) 1 g tablet Take 1 tablet (1 g total) by mouth 4 (four) times daily. 244 tablet 0  . traMADol (ULTRAM) 50 MG tablet Take 50 mg by mouth every 6 (six) hours as needed for moderate pain.    Marland Kitchen umeclidinium-vilanterol (ANORO ELLIPTA) 62.5-25 MCG/INH AEPB Inhale 1 puff into the lungs daily. 1 each 12  . amoxicillin (AMOXIL) 500 MG capsule 3 (three) times a day    . gabapentin  (NEURONTIN) 100 MG capsule Take by mouth.    . ranitidine (ZANTAC) 150 MG tablet Take 150 mg by mouth daily.      No facility-administered medications prior to visit.     Review of Systems CNS: No confusion or sedation Cardiac: No angina or palpitations GI: No abdominal pain or constipation Constitutional: No nausea vomiting fevers or chills  Objective:  BP (!) 158/79   Pulse (!) 54   Temp 97.7 F (36.5 C) (Oral)   Resp 20   Ht 5\' 5"  (1.651 m)   Wt 119 lb (54 kg)   SpO2 100%   BMI 19.80 kg/m    BP Readings from Last 3 Encounters:  12/20/17 (!) 158/79  12/06/17 110/70  11/14/17 112/71     Wt Readings from Last 3 Encounters:  12/20/17 119 lb (54 kg)  12/06/17 116 lb (52.6 kg)  11/14/17 117 lb 9.6 oz (53.3 kg)     Physical Exam Pt is alert and oriented PERRL EOMI HEART IS RRR no murmur or rub LCTA no wheezing or rales MUSCULOSKELETAL reveals some paraspinous muscle tenderness but no overt trigger points.  She has some mild tenderness over the T8 mid axillary line consistent with the symptoms she is had previously.  No other cutaneous changes are noted.  Labs  No results found for: HGBA1C Lab Results  Component Value Date   CREATININE 0.69 11/19/2016    --------------------------------------------------------------------------------------------------------------------  Lab Results  Component Value Date   WBC 8.9 11/19/2016   HGB 12.8 11/19/2016   HCT 39.0 11/19/2016   PLT 420 11/19/2016   GLUCOSE 82 11/19/2016   ALT 11 (L) 11/19/2016   AST 16 11/19/2016   NA 137 11/19/2016   K 3.7 11/19/2016   CL 105 11/19/2016   CREATININE 0.69 11/19/2016   BUN 9 11/19/2016   CO2 25 11/19/2016   TSH 0.635 02/09/2016   INR 0.91 03/28/2016    --------------------------------------------------------------------------------------------------------------------- No results found.   Assessment & Plan:   Sheila Suarez was seen today for procedure and back  pain.  Diagnoses and all orders for this visit:  Intercostal neuralgia  Acute postthoracotomy pain  Chronic left-sided thoracic back pain  Other orders -     triamcinolone acetonide (KENALOG-40) injection 40 mg -     ropivacaine (PF) 2 mg/mL (0.2%) (NAROPIN) injection 10 mL -     midazolam (VERSED) injection 5 mg        ----------------------------------------------------------------------------------------------------------------------  Problem List Items Addressed This Visit    None    Visit Diagnoses    Intercostal neuralgia    -  Primary   Acute postthoracotomy pain       Chronic left-sided thoracic back pain       Relevant Medications   triamcinolone acetonide (KENALOG-40) injection 40 mg   ropivacaine (PF) 2 mg/mL (0.2%) (NAROPIN) injection 10 mL        ----------------------------------------------------------------------------------------------------------------------  1. Intercostal neuralgia We will proceed with a repeat intercostal nerve block today.  The risks and benefits of been reviewed with her in full detail and all questions answered.  I want her to continue with stretching exercises as reviewed.  She can also continue with her Neurontin low-dose as currently taking.  We will have her return to clinic in 2 to 4 months for possible repeat injection.  We have talked about cryoablation as an option however she is unwilling to pursue this.  2. Acute postthoracotomy pain As above  3. Chronic left-sided thoracic back pain As above.  Continue baseline follow-up with her pulmonary doctors and baseline primary care physicians.    ----------------------------------------------------------------------------------------------------------------------  I am having Sheila Suarez maintain her ondansetron, ranitidine, traMADol, gabapentin, amoxicillin, sucralfate, omeprazole, lactulose, polyethylene glycol powder, umeclidinium-vilanterol, and albuterol. We  administered midazolam.   Meds ordered this encounter  Medications  . triamcinolone acetonide (KENALOG-40) injection 40 mg  . ropivacaine (PF) 2 mg/mL (0.2%) (NAROPIN) injection 10 mL  . midazolam (VERSED) injection 5 mg   Patient's Medications  New Prescriptions   No medications on file  Previous Medications   ALBUTEROL (PROVENTIL HFA;VENTOLIN HFA) 108 (90 BASE) MCG/ACT INHALER    Inhale 1-2 puffs into the lungs every 4 (four) hours as needed for wheezing or shortness of breath.   AMOXICILLIN (AMOXIL) 500 MG CAPSULE    3 (three) times a day   GABAPENTIN (NEURONTIN) 100 MG CAPSULE    Take by mouth.   LACTULOSE (CEPHULAC) 20 G PACKET    Take 1 packet (20 g total) by mouth 3 (three) times daily.   OMEPRAZOLE (PRILOSEC) 40 MG CAPSULE    Take 1 capsule (40 mg total) by mouth daily for 90 doses.   ONDANSETRON (ZOFRAN) 4 MG TABLET    Take 4 mg by mouth as needed.    POLYETHYLENE GLYCOL POWDER (GLYCOLAX/MIRALAX) POWDER    Take 255 g by mouth daily.   RANITIDINE (ZANTAC) 150 MG TABLET    Take 150 mg by mouth  daily.    SUCRALFATE (CARAFATE) 1 G TABLET    Take 1 tablet (1 g total) by mouth 4 (four) times daily.   TRAMADOL (ULTRAM) 50 MG TABLET    Take 50 mg by mouth every 6 (six) hours as needed for moderate pain.   UMECLIDINIUM-VILANTEROL (ANORO ELLIPTA) 62.5-25 MCG/INH AEPB    Inhale 1 puff into the lungs daily.  Modified Medications   No medications on file  Discontinued Medications   No medications on file   ----------------------------------------------------------------------------------------------------------------------  Follow-up: Return in about 3 months (around 03/22/2018) for evaluation, procedure.  Procedure: Left T7-T8-T9 intercostal nerve block under fluoroscopic guidance moderate sedation  left intercostal nerve block under fluoroscopic guidance at T7-T8-T9: After informed consent was obtained and the risks and benefits reviewed the patient chose to pursue repeat intercostal  nerve blocks for left-sided rib pain.  She was placed in the prone position in the fluoroscopy suite with sedation via IV Versed.  Under fluoroscopic guidance identified the T7-T8-T9 ribs approximately 5 cm to the left of midline.  This area was prepped with Betadine x3.  I then used a 25-gauge needle to inject 1 cc of lidocaine subcu overlying each of the after mentioned ribs.  Then a 22-gauge 1/2 inch needle was used to walk off the posterior inferior aspect of the rib margin with the assistance of fluoroscopic guidance.  There was negative aspiration for heme or CSF and a dose of 2 cc of 0.2% ropivacaine mixed with 3 mg of Decadron was injected at T7-T8 and ultimately T9.  The needle was removed and the patient was able to tolerate the procedure without difficulty.  She was convalesced discharged home with a noted reduction in pain. VSST  Molli Barrows, MD

## 2017-12-20 NOTE — Progress Notes (Signed)
Safety precautions to be maintained throughout the outpatient stay will include: orient to surroundings, keep bed in low position, maintain call bell within reach at all times, provide assistance with transfer out of bed and ambulation.  

## 2017-12-21 ENCOUNTER — Telehealth: Payer: Self-pay

## 2017-12-21 NOTE — Telephone Encounter (Signed)
Post procedure phone call.  Unable to get through on this number.

## 2017-12-25 ENCOUNTER — Telehealth: Payer: Self-pay | Admitting: *Deleted

## 2018-02-07 ENCOUNTER — Emergency Department: Payer: BLUE CROSS/BLUE SHIELD

## 2018-02-07 ENCOUNTER — Emergency Department
Admission: EM | Admit: 2018-02-07 | Discharge: 2018-02-07 | Disposition: A | Discharge status: Home / Self Care | Payer: BLUE CROSS/BLUE SHIELD | Attending: Emergency Medicine | Admitting: Emergency Medicine

## 2018-02-07 ENCOUNTER — Other Ambulatory Visit: Payer: Self-pay

## 2018-02-07 DIAGNOSIS — M5136 Other intervertebral disc degeneration, lumbar region: Secondary | ICD-10-CM | POA: Insufficient documentation

## 2018-02-07 DIAGNOSIS — M545 Low back pain, unspecified: Secondary | ICD-10-CM

## 2018-02-07 DIAGNOSIS — Z79899 Other long term (current) drug therapy: Secondary | ICD-10-CM | POA: Insufficient documentation

## 2018-02-07 DIAGNOSIS — M51369 Other intervertebral disc degeneration, lumbar region without mention of lumbar back pain or lower extremity pain: Secondary | ICD-10-CM

## 2018-02-07 DIAGNOSIS — I2510 Atherosclerotic heart disease of native coronary artery without angina pectoris: Secondary | ICD-10-CM | POA: Insufficient documentation

## 2018-02-07 LAB — URINALYSIS, COMPLETE (UACMP) WITH MICROSCOPIC
BILIRUBIN URINE: NEGATIVE
Bacteria, UA: NONE SEEN
Glucose, UA: NEGATIVE mg/dL
Hgb urine dipstick: NEGATIVE
KETONES UR: NEGATIVE mg/dL
LEUKOCYTES UA: NEGATIVE
Nitrite: NEGATIVE
Protein, ur: NEGATIVE mg/dL
Specific Gravity, Urine: 1.02 (ref 1.005–1.030)
WBC, UA: NONE SEEN WBC/hpf (ref 0–5)
pH: 6 (ref 5.0–8.0)

## 2018-02-07 MED ORDER — CYCLOBENZAPRINE HCL 10 MG PO TABS
5.0000 mg | ORAL_TABLET | Freq: Once | ORAL | Status: AC
  Administered 2018-02-07: 5 mg via ORAL
  Filled 2018-02-07: qty 1

## 2018-02-07 MED ORDER — LIDOCAINE 5 % EX PTCH
1.0000 | MEDICATED_PATCH | CUTANEOUS | Status: DC
  Administered 2018-02-07: 1 via TRANSDERMAL
  Filled 2018-02-07: qty 1

## 2018-02-07 MED ORDER — MELOXICAM 15 MG PO TABS: 15.0000 mg | ORAL_TABLET | Freq: Every day | ORAL | 0 refills | Status: AC

## 2018-02-07 MED ORDER — CYCLOBENZAPRINE HCL 5 MG PO TABS: ORAL_TABLET | ORAL | 0 refills | Status: AC

## 2018-02-07 MED ORDER — KETOROLAC TROMETHAMINE 30 MG/ML IJ SOLN
30.0000 mg | Freq: Once | INTRAMUSCULAR | Status: AC
  Administered 2018-02-07: 30 mg via INTRAMUSCULAR
  Filled 2018-02-07: qty 1

## 2018-02-07 MED ORDER — OXYCODONE-ACETAMINOPHEN 5-325 MG PO TABS
1.0000 | ORAL_TABLET | Freq: Once | ORAL | Status: AC
  Administered 2018-02-07: 1 via ORAL
  Filled 2018-02-07: qty 1

## 2018-02-07 MED ORDER — LIDOCAINE 5 % EX PTCH: 1.0000 | MEDICATED_PATCH | Freq: Two times a day (BID) | CUTANEOUS | 0 refills | Status: AC

## 2018-02-07 NOTE — ED Provider Notes (Signed)
[facility] Emergency Department Provider Note  ____________________________________________  Time seen: Approximately 8:51 AM  I have reviewed the triage vital signs and the nursing notes.   HISTORY  Chief Complaint Back Pain    HPI Sheila Suarez is a 62 y.o. female presents to the emergency department for evaluation of right low back pain for 1 week.  Pain radiated into the front of both thighs the other night.  She has not had any radiating pain today.  No trauma.  She took an expired tramadol last night.  No fever.  No IV drug use.  No bowel or bladder dysfunction or saddle paresthesias.  No nausea, vomiting, abdominal pain, urinary symptoms.  Past Medical History:  Diagnosis Date  . Cancer ([place])   . Coronary artery disease   . Emphysema of lung ([place])   . GERD (gastroesophageal reflux disease)   . Lupus ([place])   . Shortness of breath dyspnea     Patient Active Problem List   Diagnosis Date Noted  . 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 Chronic use of opiate drug for therapeutic purpose 09/19/2017  . Non-rheumatic mitral regurgitation 08/01/2017  . Leg pain, bilateral 03/19/2017  . Former heavy tobacco smoker 03/19/2017  . Arthralgia of multiple joints 08/28/2016  . Rib pain 07/26/2016  . Irritable bowel syndrome with constipation 06/27/2016  . H. pylori infection 06/27/2016  . Abdominal pain, left upper quadrant   . Special screening for malignant neoplasms, colon   . Pneumothorax 04/28/2016  . Lung mass 04/17/2016  . CAD (coronary artery disease) 02/28/2016  . Shortness of breath 02/28/2016  . Lupus anticoagulant positive 02/21/2016  . Weight loss 02/09/2016    Past Surgical History:  Procedure Laterality Date  . COLONOSCOPY WITH PROPOFOL N/A 06/02/2016   Procedure: COLONOSCOPY WITH PROPOFOL;  Surgeon: [name], MD;  Location[facility] ENDOSCOPY;  Service: Gastroenterology;  Laterality: N/A;  . CYST EXCISION Left    hand  .  ESOPHAGOGASTRODUODENOSCOPY (EGD) WITH PROPOFOL N/A 06/02/2016   Procedure: ESOPHAGOGASTRODUODENOSCOPY (EGD) WITH PROPOFOL;  Surgeon: [name], MD;  Location: [facility] ENDOSCOPY;  Service: Gastroenterology;  Laterality: N/A;  . FLEXIBLE BRONCHOSCOPY N/A 04/17/2016   Procedure: FLEXIBLE BRONCHOSCOPY;  Surgeon: [name], MD;  Location: [facility] ORS;  Service: Thoracic;  Laterality: N/A;  . HAND SURGERY Left   . left upper lobectomy Left 03/2016  . LUNG REMOVAL, PARTIAL Left    Left lower lobe  . THORACOTOMY Left 04/17/2016   Procedure: THORACOTOMY LEFT UPPER LOBE LUNG RESECTION;  Surgeon: [name], MD;  Location: [facility] ORS;  Service: Thoracic;  Laterality: Left;  . TUBAL LIGATION Bilateral    37 years ago    Prior to Admission medications   Medication Sig Start Date End Date Taking? Authorizing Provider  albuterol (PROVENTIL HFA;VENTOLIN HFA) 108 (90 Base) MCG/ACT inhaler Inhale 1-2 puffs into the lungs every 4 (four) hours as needed for wheezing or shortness of breath. 12/06/17   Flora Lipps, MD  amoxicillin (AMOXIL) 500 MG capsule 3 (three) times a day 06/16/16   [provider]  cyclobenzaprine (FLEXERIL) 5 MG tablet Take 1-2 tablets 3 times daily as needed 02/07/18   Laban Emperor, PA-C  gabapentin (NEURONTIN) 100 MG capsule Take by mouth. 07/26/16   [provider]  lactulose (CEPHULAC) 20 g packet Take 1 packet (20 g total) by mouth 3 (three) times daily. 11/28/17   [name], MD  lidocaine (LIDODERM) 5 % Place 1 patch onto the skin every 12 (twelve) hours. Remove &  Discard patch within 12 hours or as directed by MD 02/07/18 02/07/19  Laban Emperor, PA-C  meloxicam (MOBIC) 15 MG tablet Take 1 tablet (15 mg total) by mouth daily for 10 days. 02/07/18 02/17/18  Laban Emperor, PA-C  omeprazole (PRILOSEC) 40 MG capsule Take 1 capsule (40 mg total) by mouth daily for 90 doses. 11/14/17 02/12/18  [name], MD  ondansetron (ZOFRAN) 4 MG tablet Take 4 mg by mouth as needed.  05/10/16    [provider]  polyethylene glycol powder (GLYCOLAX/MIRALAX) powder Take 255 g by mouth daily. 11/30/17   [name], MD  ranitidine (ZANTAC) 150 MG tablet Take 150 mg by mouth daily.  06/05/16   [provider]  sucralfate (CARAFATE) 1 g tablet Take 1 tablet (1 g total) by mouth 4 (four) times daily. 11/14/17 01/14/18  [name], MD  traMADol (ULTRAM) 50 MG tablet Take 50 mg by mouth every 6 (six) hours as needed for moderate pain.    [provider]  umeclidinium-vilanterol (ANORO ELLIPTA) 62.5-25 MCG/INH AEPB Inhale 1 puff into the lungs daily. 12/06/17   Flora Lipps, MD    Allergies Patient has no known allergies.  Family History  Problem Relation Age of Onset  . Pancreatic cancer Sister   . Lung cancer Sister   . Lung cancer Brother   . Stomach cancer Paternal Grandmother   . Heart disease Father   . Cancer Daughter 76       cervical  . Cancer Maternal Aunt        brain  . Cancer Maternal Uncle        prostate  . Cancer Maternal Aunt        bone    Social History Social History   Tobacco Use  . Smoking status: Former Smoker    Packs/day: 0.25    Years: 35.00    Pack years: 8.75    Types: Cigarettes    Last attempt to quit: 03/19/2016    Years since quitting: 1.8  . Smokeless tobacco: Former Systems developer    Quit date: 03/16/2016  Substance Use Topics  . Alcohol use: Yes    Comment: rarely  . Drug use: No     Review of Systems  Constitutional: No fever/chills Respiratory: No SOB. Gastrointestinal: No abdominal pain.  No nausea, no vomiting.  Genitourinary: Negative for dysuria, urgency, frequency. Musculoskeletal: Positive for back pain.  Skin: Negative for rash, abrasions, lacerations, ecchymosis. Neurological: Negative for headaches   ____________________________________________   PHYSICAL EXAM:  VITAL SIGNS: ED Triage Vitals  Enc Vitals Group     BP 02/07/18 0837 98/71     Pulse Rate 02/07/18 0837 (!) 56     Resp 02/07/18  0837 17     Temp 02/07/18 0837 (!) 97.5 F (36.4 C)     Temp Source 02/07/18 0837 Oral     SpO2 02/07/18 0837 100 %     Weight 02/07/18 0838 116 lb (52.6 kg)     Height 02/07/18 0838 5\' 5"  (1.651 m)     Head Circumference --      Peak Flow --      Pain Score 02/07/18 0837 8     Pain Loc --      Pain Edu? --      Excl. in [place]? --      Constitutional: Alert and oriented. Well appearing and in no acute distress. Eyes: Conjunctivae are normal. PERRL. EOMI. Head: Atraumatic. ENT:      Ears:  Nose: No congestion/rhinnorhea.      Mouth/Throat: Mucous membranes are moist.  Neck: No stridor.  Cardiovascular: Normal rate, regular rhythm.  Good peripheral circulation. Respiratory: Normal respiratory effort without tachypnea or retractions. Lungs CTAB. Good air entry to the bases with no decreased or absent breath sounds. Gastrointestinal: Bowel sounds 4 quadrants. Soft and nontender to palpation. No guarding or rigidity. No palpable masses. No distention. No CVA tenderness.  Musculoskeletal: Full range of motion to all extremities. No gross deformities appreciated. Tenderness to palpation over right lumbar paraspinal muscles.  No tenderness to palpation over lumbar spine.  Negative straight leg raise. Antalgic gait. Neurologic:  Normal speech and language. No gross focal neurologic deficits are appreciated.  Skin:  Skin is warm, dry and intact. No rash noted. Psychiatric: Mood and affect are normal. Speech and behavior are normal. Patient exhibits appropriate insight and judgement.   ____________________________________________   LABS (all labs ordered are listed, but only abnormal results are displayed)  Labs Reviewed  URINALYSIS, COMPLETE (UACMP) WITH MICROSCOPIC - Abnormal; Notable for the following components:      Result Value   Color, Urine YELLOW (*)    APPearance CLEAR (*)    All other components within normal limits    ____________________________________________  EKG   ____________________________________________  RADIOLOGY Robinette Haines, personally viewed and evaluated these images (plain radiographs) as part of my medical decision making, as well as reviewing the written report by the radiologist.  Dg Lumbar Spine Complete  Result Date: 02/07/2018 CLINICAL DATA:  One-week history of low back pain. No known injuries. EXAM: LUMBAR SPINE - COMPLETE 4+ VIEW COMPARISON:  Bone window images from CT abdomen and pelvis 02/07/2016. FINDINGS: Five non-rib-bearing lumbar vertebrae with anatomic alignment. No fractures. Minimal to mild disc space narrowing at L1-2. Remaining disc spaces well-preserved. Calcifications in the L2-3 and L4-5 discs. No pars defects. Mild BILATERAL facet degenerative changes at L5-S1. Sacroiliac joints intact. Calcified uterine fibroid in the midline of the pelvis. IMPRESSION: 1. Minimal to mild degenerative disc disease at L1-2. 2. Mild facet degenerative changes at L5-S1. Electronically Signed   By: Evangeline Dakin M.D.   On: 02/07/2018 09:39    ____________________________________________    PROCEDURES  Procedure(s) performed:    Procedures    Medications  lidocaine (LIDODERM) 5 % 1 patch (1 patch Transdermal Patch Applied 02/07/18 0915)  oxyCODONE-acetaminophen (PERCOCET/ROXICET) 5-325 MG per tablet 1 tablet (1 tablet Oral Given 02/07/18 0915)  ketorolac (TORADOL) 30 MG/ML injection 30 mg (30 mg Intramuscular Given 02/07/18 1046)  cyclobenzaprine (FLEXERIL) tablet 5 mg (5 mg Oral Given 02/07/18 1046)     ____________________________________________   INITIAL IMPRESSION / ASSESSMENT AND PLAN / ED COURSE  Pertinent labs & imaging results that were available during my care of the patient were reviewed by me and considered in my medical decision making (see chart for details).  Review of the [place] CSRS was performed in accordance of the [place] prior to dispensing any  controlled drugs.     Patient presented to the emergency department for evaluation of right lower back pain for one week. Vital signs and exam are reassuring. Lumbar xray consistent with degenerative changes. No infection on urinalysis. Pain improved with percocet and lidoderm. She is going to call her pain management doctor today. Patient will be discharged home with prescriptions for Flexeril, meloxicam, lidoderm. Patient is to follow up with PCP, spine and pain management as directed. Patient is given ED precautions to return to the ED for any worsening  or new symptoms.     ____________________________________________  FINAL CLINICAL IMPRESSION(S) / ED DIAGNOSES  Final diagnoses:  Acute right-sided low back pain without sciatica  Degenerative disc disease, lumbar      NEW MEDICATIONS STARTED DURING THIS VISIT:  ED Discharge Orders        Ordered    meloxicam (MOBIC) 15 MG tablet  Daily     02/07/18 1056    cyclobenzaprine (FLEXERIL) 5 MG tablet     02/07/18 1056    lidocaine (LIDODERM) 5 %  Every 12 hours     02/07/18 1056          This chart was dictated using voice recognition software/Dragon. Despite best efforts to proofread, errors can occur which can change the meaning. Any change was purely unintentional.    Laban Emperor, PA-C 02/07/18 1525    Arta Silence, MD 02/16/18 [phone]

## 2018-02-07 NOTE — ED Triage Notes (Signed)
Pt c/o lower back pain for the past week. States she gets routine back injections

## 2018-02-07 NOTE — ED Notes (Signed)
See triage note  Presents with pain to right lower back which is moving into right leg  Sx's started about 1 week ago   No injury  States she gets injections in [place] about every 3 months and needs to schedule an appt  Denies and new or recent injury or urinary sxs'    States she has not tried anything for pain

## 2018-02-08 ENCOUNTER — Other Ambulatory Visit: Payer: Self-pay | Admitting: Gastroenterology

## 2018-02-08 DIAGNOSIS — R1013 Epigastric pain: Secondary | ICD-10-CM

## 2018-03-22 ENCOUNTER — Ambulatory Visit (HOSPITAL_BASED_OUTPATIENT_CLINIC_OR_DEPARTMENT_OTHER): Payer: BLUE CROSS/BLUE SHIELD | Admitting: Anesthesiology

## 2018-03-22 ENCOUNTER — Other Ambulatory Visit: Payer: Self-pay

## 2018-03-22 ENCOUNTER — Ambulatory Visit
Admission: RE | Admit: 2018-03-22 | Discharge: 2018-03-22 | Disposition: A | Discharge status: Home / Self Care | Payer: BLUE CROSS/BLUE SHIELD | Source: Ambulatory Visit | Attending: Anesthesiology | Admitting: Anesthesiology

## 2018-03-22 ENCOUNTER — Other Ambulatory Visit: Payer: Self-pay | Admitting: Anesthesiology

## 2018-03-22 ENCOUNTER — Encounter: Payer: Self-pay | Admitting: Anesthesiology

## 2018-03-22 VITALS — BP 134/75 | HR 57 | Temp 97.9°F | Resp 18 | Ht 65.0 in | Wt 119.0 lb

## 2018-03-22 DIAGNOSIS — R52 Pain, unspecified: Secondary | ICD-10-CM

## 2018-03-22 DIAGNOSIS — G8912 Acute post-thoracotomy pain: Secondary | ICD-10-CM | POA: Diagnosis not present

## 2018-03-22 DIAGNOSIS — M5432 Sciatica, left side: Secondary | ICD-10-CM | POA: Diagnosis not present

## 2018-03-22 DIAGNOSIS — Z79899 Other long term (current) drug therapy: Secondary | ICD-10-CM | POA: Diagnosis not present

## 2018-03-22 DIAGNOSIS — G8929 Other chronic pain: Secondary | ICD-10-CM | POA: Diagnosis not present

## 2018-03-22 DIAGNOSIS — M5136 Other intervertebral disc degeneration, lumbar region: Secondary | ICD-10-CM | POA: Diagnosis not present

## 2018-03-22 DIAGNOSIS — M546 Pain in thoracic spine: Secondary | ICD-10-CM | POA: Diagnosis not present

## 2018-03-22 DIAGNOSIS — G588 Other specified mononeuropathies: Secondary | ICD-10-CM | POA: Diagnosis not present

## 2018-03-22 MED ORDER — DEXAMETHASONE SODIUM PHOSPHATE 10 MG/ML IJ SOLN
10.0000 mg | Freq: Once | INTRAMUSCULAR | Status: AC
  Administered 2018-03-22: 10 mg

## 2018-03-22 MED ORDER — BUPIVACAINE-EPINEPHRINE (PF) 0.25% -1:200000 IJ SOLN
INTRAMUSCULAR | Status: AC
  Filled 2018-03-22: qty 30

## 2018-03-22 MED ORDER — ROPIVACAINE HCL 2 MG/ML IJ SOLN
INTRAMUSCULAR | Status: AC
  Filled 2018-03-22: qty 10

## 2018-03-22 MED ORDER — MIDAZOLAM HCL 5 MG/5ML IJ SOLN
5.0000 mg | Freq: Once | INTRAMUSCULAR | Status: AC
  Administered 2018-03-22: 1.5 mg via INTRAVENOUS

## 2018-03-22 MED ORDER — DEXAMETHASONE SODIUM PHOSPHATE 10 MG/ML IJ SOLN
INTRAMUSCULAR | Status: AC
  Filled 2018-03-22: qty 1

## 2018-03-22 MED ORDER — BUPIVACAINE-EPINEPHRINE 0.25% -1:200000 IJ SOLN
20.0000 mL | Freq: Once | INTRAMUSCULAR | Status: AC
  Administered 2018-03-22: 20 mL
  Filled 2018-03-22: qty 20

## 2018-03-22 MED ORDER — MIDAZOLAM HCL 5 MG/5ML IJ SOLN
INTRAMUSCULAR | Status: AC
  Filled 2018-03-22: qty 5

## 2018-03-22 NOTE — Progress Notes (Signed)
Subjective:  Patient ID: Sheila Suarez, female    DOB: [birth year]  Age: 62 y.o. MRN: [id number]  CC: Back Pain and Chest Pain   Procedure: Left T7-T8-T9 intercostal nerve block under fluoroscopic guidance with moderate sedation  HPI [name] presents for reevaluation.  She was last seen in May at which point she had an intercostal nerve block for her chronic left side thoracic pain.  She generally gets approximately 6 to 7 weeks of good relief with recurrence of pain over the next several weeks and sees [place] approximately every 3 months for injection therapy.  She has had some recent mild shortness of breath and recurrence of the pain of the same quality characteristic and distribution in the left mid axillary line in the region that was previously documented.  This is her primary pain complaint and her current symptoms are consistent with that without change in quality characteristic or distribution.  However she has had some mild shortness of breath which is recently changed and I have requested that she see her primary care physician for further follow-up in this regard.  She is also recently been diagnosed with degenerative disc disease and recent exacerbation of low back pain with radiation into the left calf of a gnawing aching nature.  This pain is been present for approximately 6 weeks and has been quite troubling and keeping her from getting good sleep at night.  She also has some spasming in the left calf but no documented weakness or problems with bowel or bladder function.  Outpatient Medications Prior to Visit  Medication Sig Dispense Refill  . albuterol (PROVENTIL HFA;VENTOLIN HFA) 108 (90 Base) MCG/ACT inhaler Inhale 1-2 puffs into the lungs every 4 (four) hours as needed for wheezing or shortness of breath. 1 Inhaler 12  . cyclobenzaprine (FLEXERIL) 5 MG tablet Take 1-2 tablets 3 times daily as needed 20 tablet 0  . lactulose (CEPHULAC) 20 g packet Take 1 packet (20 g  total) by mouth 3 (three) times daily. 30 each 0  . lidocaine (LIDODERM) 5 % Place 1 patch onto the skin every 12 (twelve) hours. Remove & Discard patch within 12 hours or as directed by MD 10 patch 0  . linaclotide (LINZESS) 72 MCG capsule Take by mouth.    Marland Kitchen omeprazole (PRILOSEC) 40 MG capsule Take 1 capsule (40 mg total) by mouth daily for 90 doses. 90 capsule 0  . ondansetron (ZOFRAN) 4 MG tablet Take 4 mg by mouth as needed.     . polyethylene glycol powder (GLYCOLAX/MIRALAX) powder Take 255 g by mouth daily. 255 g 3  . sucralfate (CARAFATE) 1 g tablet Take 1 tablet (1 g total) by mouth 4 (four) times daily. 244 tablet 0  . traMADol (ULTRAM) 50 MG tablet Take 50 mg by mouth every 6 (six) hours as needed for moderate pain.    Marland Kitchen umeclidinium-vilanterol (ANORO ELLIPTA) 62.5-25 MCG/INH AEPB Inhale 1 puff into the lungs daily. 1 each 12  . amoxicillin (AMOXIL) 500 MG capsule 3 (three) times a day    . gabapentin (NEURONTIN) 100 MG capsule Take by mouth.    . ranitidine (ZANTAC) 150 MG tablet Take 150 mg by mouth daily.      No facility-administered medications prior to visit.     Review of Systems CNS: No confusion or sedation Cardiac: No angina or palpitations GI: No abdominal pain or constipation Constitutional: No nausea vomiting fevers or chills  Objective:  BP 134/75   Pulse (!) 57  Temp 97.9 F (36.6 C)   Resp 18   Ht 5\' 5"  (1.651 m)   Wt 119 lb (54 kg)   SpO2 100%   BMI 19.80 kg/m    BP Readings from Last 3 Encounters:  03/22/18 134/75  02/07/18 100/68  12/20/17 126/72     Wt Readings from Last 3 Encounters:  03/22/18 119 lb (54 kg)  02/07/18 116 lb (52.6 kg)  12/20/17 119 lb (54 kg)     Physical Exam Pt is alert and oriented PERRL EOMI HEART IS RRR no murmur or rub LCTA no wheezing or rales MUSCULOSKELETAL reveals some paraspinous muscle tenderness in the lumbar region.  She does have a positive straight leg raise on the left side.  Her muscle tone and  bulk is good.  In regards to the left chest wall pain this is consistent with what she is previously experienced.  Labs  No results found for: HGBA1C Lab Results  Component Value Date   CREATININE 0.69 11/19/2016    -------------------------------------------------------------------------------------------------------------------- Lab Results  Component Value Date   WBC 8.9 11/19/2016   HGB 12.8 11/19/2016   HCT 39.0 11/19/2016   PLT 420 11/19/2016   GLUCOSE 82 11/19/2016   ALT 11 (L) 11/19/2016   AST 16 11/19/2016   NA 137 11/19/2016   K 3.7 11/19/2016   CL 105 11/19/2016   CREATININE 0.69 11/19/2016   BUN 9 11/19/2016   CO2 25 11/19/2016   TSH 0.635 02/09/2016   INR 0.91 03/28/2016    --------------------------------------------------------------------------------------------------------------------- Dg C-arm 1-60 Min-no Report  Result Date: 03/22/2018 Fluoroscopy was utilized by the requesting physician.  No radiographic interpretation.     Assessment & Plan:   Sheila was seen today for back pain and chest pain.  Diagnoses and all orders for this visit:  Intercostal neuralgia  Acute postthoracotomy pain  Chronic left-sided thoracic back pain  DDD (degenerative disc disease), lumbar  Sciatica of left side  Other orders -     dexamethasone (DECADRON) injection 10 mg -     bupivacaine-EPINEPHrine (MARCAINE W/ EPI) 0.25% -1:200000 (with pres) injection 20 mL -     midazolam (VERSED) 5 MG/5ML injection 5 mg        ----------------------------------------------------------------------------------------------------------------------  Problem List Items Addressed This Visit    None    Visit Diagnoses    Intercostal neuralgia    -  Primary   Acute postthoracotomy pain       Chronic left-sided thoracic back pain       Relevant Medications   dexamethasone (DECADRON) injection 10 mg (Completed)   bupivacaine-EPINEPHrine (MARCAINE W/ EPI) 0.25%  -1:200000 (with pres) injection 20 mL (Completed)   DDD (degenerative disc disease), lumbar       Relevant Medications   dexamethasone (DECADRON) injection 10 mg (Completed)   Sciatica of left side       Relevant Medications   midazolam (VERSED) 5 MG/5ML injection 5 mg (Completed)        ----------------------------------------------------------------------------------------------------------------------  1. Intercostal neuralgia We will proceed with a repeat injection today for her intercostal neuralgia.  We have gone over the risks and benefits of the procedure with her in full detail.  I have instructed her that should she have any worsening shortness of breath especially after the procedure she is to seek immediate medical attention secondary to the risk of pneumothorax associated with this procedure.  He understands and expresses a willingness to comply.  Furthermore I talked to her about the recent shortness of breath  and have requested that she follow-up with her primary care physician for evaluation and possible chest x-ray if indicated.  2. Acute postthoracotomy pain As above  3. Chronic left-sided thoracic back pain Above  4. DDD (degenerative disc disease), lumbar We are going to schedule her for an epidural steroid as requested by her referring physician and has reviewed with her today.  We will do this at the next available date in approximately 2 to 3 weeks to see if we can help with some of her persistent low back pain and left-sided sciatica.  5. Sciatica of left side Above    ----------------------------------------------------------------------------------------------------------------------  I am having Sheila Suarez maintain her ondansetron, ranitidine, traMADol, gabapentin, amoxicillin, sucralfate, omeprazole, lactulose, polyethylene glycol powder, umeclidinium-vilanterol, albuterol, cyclobenzaprine, lidocaine, and linaclotide. We administered dexamethasone,  bupivacaine-EPINEPHrine, and midazolam.   Meds ordered this encounter  Medications  . dexamethasone (DECADRON) injection 10 mg  . bupivacaine-EPINEPHrine (MARCAINE W/ EPI) 0.25% -1:200000 (with pres) injection 20 mL  . midazolam (VERSED) 5 MG/5ML injection 5 mg   Patient's Medications  New Prescriptions   No medications on file  Previous Medications   ALBUTEROL (PROVENTIL HFA;VENTOLIN HFA) 108 (90 BASE) MCG/ACT INHALER    Inhale 1-2 puffs into the lungs every 4 (four) hours as needed for wheezing or shortness of breath.   AMOXICILLIN (AMOXIL) 500 MG CAPSULE    3 (three) times a day   CYCLOBENZAPRINE (FLEXERIL) 5 MG TABLET    Take 1-2 tablets 3 times daily as needed   GABAPENTIN (NEURONTIN) 100 MG CAPSULE    Take by mouth.   LACTULOSE (CEPHULAC) 20 G PACKET    Take 1 packet (20 g total) by mouth 3 (three) times daily.   LIDOCAINE (LIDODERM) 5 %    Place 1 patch onto the skin every 12 (twelve) hours. Remove & Discard patch within 12 hours or as directed by MD   LINACLOTIDE (LINZESS) 72 MCG CAPSULE    Take by mouth.   OMEPRAZOLE (PRILOSEC) 40 MG CAPSULE    Take 1 capsule (40 mg total) by mouth daily for 90 doses.   ONDANSETRON (ZOFRAN) 4 MG TABLET    Take 4 mg by mouth as needed.    POLYETHYLENE GLYCOL POWDER (GLYCOLAX/MIRALAX) POWDER    Take 255 g by mouth daily.   RANITIDINE (ZANTAC) 150 MG TABLET    Take 150 mg by mouth daily.    SUCRALFATE (CARAFATE) 1 G TABLET    Take 1 tablet (1 g total) by mouth 4 (four) times daily.   TRAMADOL (ULTRAM) 50 MG TABLET    Take 50 mg by mouth every 6 (six) hours as needed for moderate pain.   UMECLIDINIUM-VILANTEROL (ANORO ELLIPTA) 62.5-25 MCG/INH AEPB    Inhale 1 puff into the lungs daily.  Modified Medications   No medications on file  Discontinued Medications   No medications on file   ----------------------------------------------------------------------------------------------------------------------  Follow-up: Return in about 1 month (around  04/22/2018) for evaluation, procedure.   left intercostal nerve block under fluoroscopic guidance at T7-T8-T9: After informed consent was obtained and the risks and benefits reviewed the patient chose to pursue repeat intercostal nerve blocks for left-sided rib pain.  She was placed in the prone position in the fluoroscopy suite with sedation via IV Versed 1.5 mg.  Under fluoroscopic guidance identified the T7-T8-T9 ribs approximately 5 cm to the left of midline.  This area was prepped with Betadine x3.  I then used a 25-gauge needle to inject 1 cc of lidocaine subcu overlying  each of the after mentioned ribs.  Then a 22-gauge 1/2 inch needle was used to walk off the posterior inferior aspect of the rib margin with the assistance of fluoroscopic guidance.  There was negative aspiration for heme or CSF and a dose of 2 cc of 0.2% ropivacaine mixed with 3 mg of Decadron was injected at T7-T8 and ultimately T9.  The needle was removed and the patient was able to tolerate the procedure without difficulty.  She was convalesced discharged home with a noted reduction in pain. VSST  Molli Barrows, MD

## 2018-03-22 NOTE — Patient Instructions (Signed)
Preparing for Procedure with Sedation Instructions: . Oral Intake: Do not eat or drink anything for at least 8 hours prior to your procedure. . Transportation: Public transportation is not allowed. Bring an adult driver. The driver must be physically present in our waiting room before any procedure can be started. Marland Kitchen Physical Assistance: Bring an adult capable of physically assisting you, in the event you need help. . Blood Pressure Medicine: Take your blood pressure medicine with a sip of water the morning of the procedure. . Insulin: Take only  of your normal insulin dose. . Preventing infections: Shower with an antibacterial soap the morning of your procedure. . Build-up your immune system: Take 1000 mg of Vitamin C with every meal (3 times a day) the day prior to your procedure. . Pregnancy: If you are pregnant, call and cancel the procedure. . Sickness: If you have a cold, fever, or any active infections, call and cancel the procedure. . Arrival: You must be in the facility at least 30 minutes prior to your scheduled procedure. . Children: Do not bring children with you. . Dress appropriately: Bring dark clothing that you would not mind if they get stained. . Valuables: Do not bring any jewelry or valuables. Procedure appointments are reserved for interventional treatments only. Marland Kitchen No Prescription Refills. . No medication changes will be discussed during procedure appointments. No disability issues will be discussed.Post-procedure Information What to expect: Most procedures involve the use of a local anesthetic (numbing medicine), and a steroid (anti-inflammatory medicine).  The local anesthetics may cause temporary numbness and weakness of the legs or arms, depending on the location of the block. This numbness/weakness may last 4-6 hours, depending on the local anesthetic used. In rare instances, it can last up to 24 hours. While numb, you must be very careful not to injure the  extremity.  After any procedure, you could expect the pain to get better within 15-20 minutes. This relief is temporary and may last 4-6 hours. Once the local anesthetics wears off, you could experience discomfort, possibly more than usual, for up to 10 (ten) days. In the case of radiofrequencies, it may last up to 6 weeks. Surgeries may take up to 8 weeks for the healing process. The discomfort is due to the irritation caused by needles going through skin and muscle. To minimize the discomfort, we recommend using ice the first day, and heat from then on. The ice should be applied for 15 minutes on, and 15 minutes off. Keep repeating this cycle until bedtime. Avoid applying the ice directly to the skin, to prevent frostbite. Heat should be used daily, until the pain improves (4-10 days). Be careful not to burn yourself.  Occasionally you may experience muscle spasms or cramps. These occur as a consequence of the irritation caused by the needle sticks to the muscle and the blood that will inevitably be lost into the surrounding muscle tissue. Blood tends to be very irritating to tissues, which tend to react by going into spasm. These spasms may start the same day of your procedure, but they may also take days to develop. This late onset type of spasm or cramp is usually caused by electrolyte imbalances triggered by the steroids, at the level of the kidney. Cramps and spasms tend to respond well to muscle relaxants, multivitamins (some are triggered by the procedure, but may have their origins in vitamin deficiencies), and "Gatorade", or any sports drinks that can replenish any electrolyte imbalances. (If you are a diabetic, ask  your pharmacist to get you a sugar-free brand.) Warm showers or baths may also be helpful. Stretching exercises are highly recommended. General Instructions:  Be alert for signs of possible infection: redness, swelling, heat, red streaks, elevated temperature, and/or fever. These  typically appear 4 to 6 days after the procedure. Immediately notify your doctor if you experience unusual bleeding, difficulty breathing, or loss of bowel or bladder control. If you experience increased pain, do not increase your pain medicine intake, unless instructed by your pain physician. Post-Procedure Care:  Be careful in moving about. Muscle spasms in the area of the injection may occur. Applying ice or heat to the area is often helpful. The incidence of spinal headaches after epidural injections ranges between 1.4% and 6%. If you develop a headache that does not seem to respond to conservative therapy, please let your physician know. This can be treated with an epidural blood patch.   Post-procedure numbness or redness is to be expected, however it should average 4 to 6 hours. If numbness and weakness of your extremities begins to develop 4 to 6 hours after your procedure, and is felt to be progressing and worsening, immediately contact your physician.   Diet:  If you experience nausea, do not eat until this sensation goes away. If you had a "Stellate Ganglion Block" for upper extremity "Reflex Sympathetic Dystrophy", do not eat or drink until your hoarseness goes away. In any case, always start with liquids first and if you tolerate them well, then slowly progress to more solid foods. Activity:  For the first 4 to 6 hours after the procedure, use caution in moving about as you may experience numbness and/or weakness. Use caution in cooking, using household electrical appliances, and climbing steps. If you need to reach your Doctor call our office: [phone]) [phone] Monday-Thursday 8:00 am - 4:00 PM    Fridays: Closed     In case of an emergency: In case of emergency, call 911 or go to the nearest emergency room and have the physician there call us.  Interpretation of Procedure Every nerve block has two components: a diagnostic component, and a treatment component. Unrealistic expectations are  the most common causes of "perceived failure".  In a perfect world, a single nerve block should be able to completely and permanently eliminate the pain. Sadly, the world is not perfect.  Most pain management nerve blocks are performed using local anesthetics and steroids. Steroids are responsible for any long-term benefit that you may experience. Their purpose is to decrease any chronic swelling that may exist in the area. Steroids begin to work immediately after being injected. However, most patients will not experience any benefits until 5 to 10 days after the injection, when the swelling has come down to the point where they can tell a difference. Steroids will only help if there is swelling to be treated. As such, they can assist with the diagnosis. If effective, they suggest an inflammatory component to the pain, and if ineffective, they rule out inflammation as the main cause or component of the problem. If the problem is one of mechanical compression, you will get no benefit from those steroids.   In the case of local anesthetics, they have a crucial role in the diagnosis of your condition. Most will begin to work within15 to 20 minutes after injection. The duration will depend on the type used (short- vs. Long-acting). It is of outmost importance that patients keep tract of their pain, after the procedure. To assist with  this matter, a "Post-procedure Pain Diary" is provided. Make sure to complete it and to bring it back to your follow-up appointment.  As long as the patient keeps accurate, detailed records of their symptoms after every procedure, and returns to have those interpreted, every procedure will provide [place] with invaluable information. Even a block that does not provide the patient with any relief, will always provide [place] with information about the mechanism and the origin of the pain. The only time a nerve block can be considered a waste of time is when patients do not keep track of the  results, or do not keep their post-procedure appointment.  Reporting the results back to your physician The Pain Score  Pain is a subjective complaint. It cannot be seen, touched, or measured. We depend entirely on the patient's report of the pain in order to assess your condition and treatment. To evaluate the pain, we use a pain scale, where "0" means "No Pain", and a "10" is "the worst possible pain that you can even imagine" (i.e. something like been eaten alive by a shark or being torn apart by a lion).  .  You will frequently be asked to rate your pain. Please be as accurate, remember that medical decisions will be based on your responses. Please do not rate your pain above a 10. Doing so is actually interpreted as "symptom magnification" (exaggeration), as well as lack of understanding with regards to the scale. To put this into perspective, when you tell us that your pain is at a 10 (ten), what you are saying is that there is nothing we can do to make this pain any worse. (Carefully think about that.)

## 2018-03-25 ENCOUNTER — Telehealth: Payer: Self-pay | Admitting: *Deleted

## 2018-03-25 NOTE — Telephone Encounter (Signed)
Attempted to call for post procedure follow-up. Message left. 

## 2018-03-29 ENCOUNTER — Ambulatory Visit (INDEPENDENT_AMBULATORY_CARE_PROVIDER_SITE_OTHER): Payer: BLUE CROSS/BLUE SHIELD | Admitting: Internal Medicine

## 2018-03-29 ENCOUNTER — Encounter: Payer: Self-pay | Admitting: Internal Medicine

## 2018-03-29 VITALS — BP 118/68 | HR 65 | Resp 16 | Ht 65.0 in | Wt 116.0 lb

## 2018-03-29 DIAGNOSIS — J449 Chronic obstructive pulmonary disease, unspecified: Secondary | ICD-10-CM | POA: Diagnosis not present

## 2018-03-29 DIAGNOSIS — F419 Anxiety disorder, unspecified: Secondary | ICD-10-CM | POA: Diagnosis not present

## 2018-03-29 DIAGNOSIS — L905 Scar conditions and fibrosis of skin: Secondary | ICD-10-CM

## 2018-03-29 DIAGNOSIS — G4719 Other hypersomnia: Secondary | ICD-10-CM

## 2018-03-29 MED ORDER — ALPRAZOLAM 0.5 MG PO TABS: 0.5000 mg | ORAL_TABLET | Freq: Three times a day (TID) | ORAL | 0 refills | Status: AC | PRN

## 2018-03-29 NOTE — Patient Instructions (Signed)
Please obtain Sleep study Continue inhalers as prescribed  Referral to Dr Genevive Bi for scar tissue s/p surgery

## 2018-03-29 NOTE — Progress Notes (Signed)
[facility] Consultation      Date: 03/29/2018,   MRN# [id number] Sheila Suarez [birth year]     AdmissionWeight: 116 lb (52.6 kg)                 CurrentWeight: 116 lb (52.6 kg) Sheila Suarez is a 62 y.o. old female seen in consultation for SOB at the request of Dr. Genevive Bi     CHIEF COMPLAINT:   SOB. Follow up COPD New complaint Now with excessive daytime sleepiness Complains of  chest wall pain   HISTORY OF PRESENT ILLNESS   56 AAF with chronic tobacco abuse Quit smoking 03/2016 Had dx of LUL nodule since 2012 Underwent LUL lung resection by Dr Genevive Bi 03/2016 as requested by oncology Benign findings per path reports    She is very anxious and and still complains of LEFT sided chest wall pain  Also with chest wall pain since surgery Has seen pain clinic for and was told she can get injection sees Dr Andree Elk  Her ONO and 6MWT WNL no evidence of hypoxia at last OV  Previous CT chest shows no significant findings except some mild emphysematous changes Has chronic SOB and DOE but stable Doing well with inhaler therapy-takes ANORO, albuterol as needed   Patient has been having excessive daytime sleepiness Patient has been having extreme fatigue and tiredness, lack of energy + struggling breathe at night and gasps for air  [place] [address]   She has no signs of infection at this time No signs of CHF    I have advised her to call her Cardiologist PFT ratio [id number]%-[id number] 435predicted mild obstruction Moderate restriction-NL DLCO when corrected for volumes  PFT PFT   Current Medication:  Current Outpatient Medications:  .  albuterol (PROVENTIL HFA;VENTOLIN HFA) 108 (90 Base) MCG/ACT inhaler, Inhale 1-2 puffs into the lungs every 4 (four) hours as needed for wheezing or shortness of breath., Disp: 1 Inhaler, Rfl: 12 .  cyclobenzaprine (FLEXERIL) 5 MG tablet, Take 1-2 tablets 3 times daily as needed, Disp: 20 tablet, Rfl: 0 .  gabapentin  (NEURONTIN) 100 MG capsule, Take by mouth., Disp: , Rfl:  .  lactulose (CEPHULAC) 20 g packet, Take 1 packet (20 g total) by mouth 3 (three) times daily., Disp: 30 each, Rfl: 0 .  lidocaine (LIDODERM) 5 %, Place 1 patch onto the skin every 12 (twelve) hours. Remove & Discard patch within 12 hours or as directed by MD, Disp: 10 patch, Rfl: 0 .  linaclotide (LINZESS) 72 MCG capsule, Take by mouth., Disp: , Rfl:  .  omeprazole (PRILOSEC) 40 MG capsule, Take 1 capsule (40 mg total) by mouth daily for 90 doses., Disp: 90 capsule, Rfl: 0 .  ondansetron (ZOFRAN) 4 MG tablet, Take 4 mg by mouth as needed. , Disp: , Rfl:  .  polyethylene glycol powder (GLYCOLAX/MIRALAX) powder, Take 255 g by mouth daily., Disp: 255 g, Rfl: 3 .  ranitidine (ZANTAC) 150 MG tablet, Take 150 mg by mouth daily. , Disp: , Rfl:  .  traMADol (ULTRAM) 50 MG tablet, Take 50 mg by mouth every 6 (six) hours as needed for moderate pain., Disp: , Rfl:  .  umeclidinium-vilanterol (ANORO ELLIPTA) 62.5-25 MCG/INH AEPB, Inhale 1 puff into the lungs daily., Disp: 1 each, Rfl: 12 .  sucralfate (CARAFATE) 1 g tablet, Take 1 tablet (1 g total) by mouth 4 (four) times daily., Disp: 244 tablet, Rfl: 0    ALLERGIES   Patient has no allergy information  on record.     REVIEW OF SYSTEMS   Review of Systems  Constitutional: Positive for malaise/fatigue. Negative for chills, diaphoresis, fever and weight loss.  HENT: Negative for congestion and hearing loss.   Eyes: Negative for blurred vision and double vision.  Respiratory: Negative for cough, hemoptysis, sputum production, shortness of breath and wheezing.   Cardiovascular: Positive for chest pain. Negative for palpitations, orthopnea and leg swelling.  Gastrointestinal: Negative for abdominal pain, heartburn, nausea and vomiting.  Genitourinary: Negative for dysuria and urgency.  Musculoskeletal: Negative for back pain, myalgias and neck pain.  Skin: Negative for rash.  Neurological:  Negative for dizziness, tingling, tremors, weakness and headaches.  Endo/Heme/Allergies: Does not bruise/bleed easily.  Psychiatric/Behavioral: The patient is nervous/anxious.   All other systems reviewed and are negative.    VS: BP 118/68 (BP Location: Left Arm, Cuff Size: Normal)   Pulse 65   Resp 16   Ht 5\' 5"  (1.651 m)   Wt 116 lb (52.6 kg)   SpO2 100%   BMI 19.30 kg/m      PHYSICAL EXAM  Physical Exam  Constitutional: She is oriented to person, place, and time. She appears well-developed and well-nourished. No distress.  HENT:  Head: Normocephalic and atraumatic.  Mouth/Throat: No oropharyngeal exudate.  Eyes: Pupils are equal, round, and reactive to light. EOM are normal. No scleral icterus.  Neck: Normal range of motion. Neck supple.  Cardiovascular: Normal rate, regular rhythm and normal heart sounds.  No murmur heard. Pulmonary/Chest: No stridor. No respiratory distress. She has no wheezes.  Left chest wall pain with palpation  Abdominal: Soft. Bowel sounds are normal.  Musculoskeletal: Normal range of motion. She exhibits no edema.  Neurological: She is alert and oriented to person, place, and time. No cranial nerve deficit.  Skin: Skin is warm. She is not diaphoretic.  Psychiatric: She has a normal mood and affect.      IMAGING  CT chest 10/2016 I have Independently reviewed images of  CT chest    Interpretation:no acute findings, no effusions, no massess  PFT 08/29/16  Mod restrictive Lung disease FVC 63% predicted 2L FEF 25/75 53% predicted  Mixed mild obstructive small airways disease     ASSESSMENT/PLAN  62 thin AAF seen today for SOB with exertion with h/o chronic tobacco abuse with h/o LUL nodule s/p left upper lobe Lung resection with benign findings with signs and symptoms of reactive airways disease with small airways obstruction(COPD) and left chest wall pain with persistent chest wall pain and new symptoms of excessive daytime sleepiness,  findings c/w probable underlying OSA  Her ONO and 6MWT were WNL, her level of SOB does not correlate with CT findings or PFT findings   #1 SOB mild Gold Stage A COPD No exacerbation as this time No need for abx or prednisone at this time Continue inhalers as prescribed  #2 persistent chest wall pain Scar tissue?? Will need opinion and follow up with Dr. Genevive Bi -patient advised to follow up with pain clinic-Dr Andree Elk  #3 excessive daytime sleepiness Patient will need sleep study to assess for OSA    No signs of infection at this time continue inhalers as prescribed I have explained to patient to pace her self with exertional work She seems to understand that she will not be able to physically do the things she used to do.   Follow up in 6 months   Patient  satisfied with Plan of action and management. All questions answered  Maretta Bees  Patricia Pesa, M.D.  [facility]  Medical Director [place] Director [facility]io-P[facility] Department

## 2018-04-05 ENCOUNTER — Ambulatory Visit: Payer: BLUE CROSS/BLUE SHIELD | Admitting: Cardiothoracic Surgery

## 2018-04-05 ENCOUNTER — Encounter: Payer: Self-pay | Admitting: Cardiothoracic Surgery

## 2018-04-05 VITALS — BP 147/77 | HR 64 | Temp 97.7°F | Resp 20 | Ht 65.0 in | Wt 117.6 lb

## 2018-04-05 DIAGNOSIS — C3492 Malignant neoplasm of unspecified part of left bronchus or lung: Secondary | ICD-10-CM

## 2018-04-05 NOTE — Progress Notes (Signed)
  Patient ID: Sheila Suarez, female   DOB: [birth year], 62 y.o.   MRN: [id number]  HISTORY: [name] returns today in follow-up.  She was recently seen by Dr. Woody Seller for increasing shortness of breath and chest pain.  She also sees Dr. Vashti Hey in the chronic pain management clinic.  She states that she feels like she gets a knot under her left rib cage which she says feels hard.  She also complains of some numbness in her left breast and soreness along her left thoracotomy wound.  She was told that she may have some underlying scar tissue that is contributing to her soreness.  Dr. Andree Elk is scheduled to perform an epidural injection later this month to see if this will help with her chronic pain.   Vitals:   04/05/18 0849  BP: (!) 147/77  Pulse: 64  Resp: 20  Temp: 97.7 F (36.5 C)  SpO2: 98%     EXAM:    Resp: Lungs are clear bilaterally.  No respiratory distress, normal effort. Heart:  Regular without murmurs Abd:  Abdomen is soft, non distended and non tender. No masses are palpable.  There is no rebound and no guarding.  Neurological: Alert and oriented to person, place, and time. Coordination normal.  Skin: Skin is warm and dry. No rash noted. No diaphoretic. No erythema. No pallor. Her thoracotomy wound is well-healed without evidence of hernia, rib separation or muscle dehiscence. Psychiatric: Normal mood and affect. Normal behavior. Judgment and thought content normal.    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  I encouraged her to continue her follow-up with Dr. Andree Elk.  At the present time I did not have much in the way of surgery that would help relieve her symptoms.    [name], MD

## 2018-04-05 NOTE — Patient Instructions (Signed)
I will talk to Dr. Andree Elk today about your case. Once we make a decision what will be done, I will let you know.

## 2018-04-23 ENCOUNTER — Ambulatory Visit (HOSPITAL_BASED_OUTPATIENT_CLINIC_OR_DEPARTMENT_OTHER): Payer: BLUE CROSS/BLUE SHIELD | Admitting: Anesthesiology

## 2018-04-23 ENCOUNTER — Other Ambulatory Visit: Payer: Self-pay | Admitting: Anesthesiology

## 2018-04-23 ENCOUNTER — Ambulatory Visit
Admission: RE | Admit: 2018-04-23 | Discharge: 2018-04-23 | Disposition: A | Discharge status: Home / Self Care | Payer: BLUE CROSS/BLUE SHIELD | Source: Ambulatory Visit | Attending: Anesthesiology | Admitting: Anesthesiology

## 2018-04-23 ENCOUNTER — Other Ambulatory Visit: Payer: Self-pay

## 2018-04-23 ENCOUNTER — Encounter: Payer: Self-pay | Admitting: Anesthesiology

## 2018-04-23 VITALS — BP 141/78 | HR 66 | Temp 98.3°F | Resp 19 | Ht 65.0 in | Wt 116.0 lb

## 2018-04-23 DIAGNOSIS — R52 Pain, unspecified: Secondary | ICD-10-CM | POA: Diagnosis present

## 2018-04-23 DIAGNOSIS — G8929 Other chronic pain: Secondary | ICD-10-CM

## 2018-04-23 DIAGNOSIS — M546 Pain in thoracic spine: Secondary | ICD-10-CM

## 2018-04-23 DIAGNOSIS — M5432 Sciatica, left side: Secondary | ICD-10-CM

## 2018-04-23 DIAGNOSIS — M5136 Other intervertebral disc degeneration, lumbar region: Secondary | ICD-10-CM

## 2018-04-23 DIAGNOSIS — G8912 Acute post-thoracotomy pain: Secondary | ICD-10-CM

## 2018-04-23 DIAGNOSIS — G588 Other specified mononeuropathies: Secondary | ICD-10-CM | POA: Diagnosis not present

## 2018-04-23 MED ORDER — IOPAMIDOL (ISOVUE-M 200) INJECTION 41%
20.0000 mL | Freq: Once | INTRAMUSCULAR | Status: DC | PRN
  Administered 2018-04-23: 10 mL
  Filled 2018-04-23: qty 20

## 2018-04-23 MED ORDER — SODIUM CHLORIDE 0.9% FLUSH
10.0000 mL | Freq: Once | INTRAVENOUS | Status: AC
  Administered 2018-04-23: 1 mL

## 2018-04-23 MED ORDER — LIDOCAINE HCL (PF) 1 % IJ SOLN
5.0000 mL | Freq: Once | INTRAMUSCULAR | Status: AC
  Administered 2018-04-23: 5 mL via SUBCUTANEOUS
  Filled 2018-04-23: qty 5

## 2018-04-23 MED ORDER — MIDAZOLAM HCL 5 MG/5ML IJ SOLN
5.0000 mg | Freq: Once | INTRAMUSCULAR | Status: AC
  Administered 2018-04-23: 4 mg via INTRAVENOUS
  Filled 2018-04-23: qty 5

## 2018-04-23 MED ORDER — LACTATED RINGERS IV SOLN
1000.0000 mL | INTRAVENOUS | Status: DC
  Administered 2018-04-23: 1000 mL via INTRAVENOUS

## 2018-04-23 MED ORDER — ROPIVACAINE HCL 2 MG/ML IJ SOLN
10.0000 mL | Freq: Once | INTRAMUSCULAR | Status: AC
  Administered 2018-04-23: 1 mL via EPIDURAL
  Filled 2018-04-23: qty 10

## 2018-04-23 MED ORDER — TRIAMCINOLONE ACETONIDE 40 MG/ML IJ SUSP
40.0000 mg | Freq: Once | INTRAMUSCULAR | Status: AC
  Administered 2018-04-23: 40 mg
  Filled 2018-04-23: qty 1

## 2018-04-23 NOTE — Progress Notes (Signed)
Safety precautions to be maintained throughout the outpatient stay will include: orient to surroundings, keep bed in low position, maintain call bell within reach at all times, provide assistance with transfer out of bed and ambulation.  

## 2018-04-23 NOTE — Patient Instructions (Signed)

## 2018-04-23 NOTE — Progress Notes (Signed)
Subjective:  Patient ID: Sheila Suarez, female    DOB: [birth year]  Age: 62 y.o. MRN: [id number]  CC: Back Pain (middle)   Procedure: L5-S1 lumbar epidural steroid under fluoroscopic guidance moderate sedation  HPI [name] presents for reevaluation.  She was last seen 2 months ago and continues to have low back pain with radiation in the left lateral buttock and down the left posterior lateral leg affecting the left big toe.  She occasionally gets some calf cramping primarily left greater than right and continues to have low back pain.  Otherwise her lower extremity strength and function is good no changes in bowel or bladder function are noted.  She is taking her medications as prescribed and continues to have left side intercostal rib pain.  This was recently evaluated by Dr. 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  He has requested that we continue to treat her condition with the injections as these do give her relief.  Outpatient Medications Prior to Visit  Medication Sig Dispense Refill  . albuterol (PROVENTIL HFA;VENTOLIN HFA) 108 (90 Base) MCG/ACT inhaler Inhale 1-2 puffs into the lungs every 4 (four) hours as needed for wheezing or shortness of breath. 1 Inhaler 12  . ALPRAZolam (XANAX) 0.5 MG tablet Take 1 tablet (0.5 mg total) by mouth 3 (three) times daily as needed for sleep or anxiety. 30 tablet 0  . azithromycin (ZITHROMAX) 250 MG tablet   0  . benzonatate (TESSALON) 200 MG capsule   0  . cyclobenzaprine (FLEXERIL) 5 MG tablet Take 1-2 tablets 3 times daily as needed 20 tablet 0  . fluticasone (FLONASE) 50 MCG/ACT nasal spray Place into the nose.    Marland Kitchen guaiFENesin-codeine 100-10 MG/5ML syrup Take by mouth.    . lactulose (CEPHULAC) 20 g packet Take 1 packet (20 g total) by mouth 3 (three) times daily. 30 each 0  . lidocaine (LIDODERM) 5 % Place 1 patch onto the skin every 12 (twelve) hours. Remove &  Discard patch within 12 hours or as directed by MD 10 patch 0  . linaclotide (LINZESS) 72 MCG capsule Take by mouth.    . ondansetron (ZOFRAN) 4 MG tablet Take 4 mg by mouth as needed.     . polyethylene glycol powder (GLYCOLAX/MIRALAX) powder Take 255 g by mouth daily. 255 g 3  . ranitidine (ZANTAC) 150 MG tablet Take 150 mg by mouth daily.     . traMADol (ULTRAM) 50 MG tablet Take 50 mg by mouth every 6 (six) hours as needed for moderate pain.    Marland Kitchen umeclidinium-vilanterol (ANORO ELLIPTA) 62.5-25 MCG/INH AEPB Inhale 1 puff into the lungs daily. 1 each 12  . omeprazole (PRILOSEC) 40 MG capsule Take 1 capsule (40 mg total) by mouth daily for 90 doses. 90 capsule 0  . sucralfate (CARAFATE) 1 g tablet Take 1 tablet (1 g total) by mouth 4 (four) times daily. 244 tablet 0   No facility-administered medications prior to visit.     Review of Systems CNS: No confusion or sedation Cardiac: No angina or palpitations GI: No abdominal pain or constipation Constitutional: No nausea vomiting fevers or chills  Objective:  BP 120/76 (BP Location: Left Arm)   Pulse 68   Temp 98.3 F (36.8 C)   Resp 18   Ht 5\' 5"  (1.651 m)   Wt 116 lb (52.6 kg)   SpO2 100%   BMI 19.30 kg/m    BP  Readings from Last 3 Encounters:  04/23/18 120/76  04/05/18 (!) 147/77  03/29/18 118/68     Wt Readings from Last 3 Encounters:  04/23/18 116 lb (52.6 kg)  04/05/18 117 lb 9.6 oz (53.3 kg)  03/29/18 116 lb (52.6 kg)     Physical Exam Pt is alert and oriented PERRL EOMI HEART IS RRR no murmur or rub LCTA no wheezing or rales MUSCULOSKELETAL reveals a positive straight leg raise on the left side.  Some mild paraspinous muscle tenderness on the left as well.  Her muscle tone and bulk is at baseline  Labs  No results found for: HGBA1C Lab Results  Component Value Date   CREATININE 0.69 11/19/2016     -------------------------------------------------------------------------------------------------------------------- Lab Results  Component Value Date   WBC 8.9 11/19/2016   HGB 12.8 11/19/2016   HCT 39.0 11/19/2016   PLT 420 11/19/2016   GLUCOSE 82 11/19/2016   ALT 11 (L) 11/19/2016   AST 16 11/19/2016   NA 137 11/19/2016   K 3.7 11/19/2016   CL 105 11/19/2016   CREATININE 0.69 11/19/2016   BUN 9 11/19/2016   CO2 25 11/19/2016   TSH 0.635 02/09/2016   INR 0.91 03/28/2016    --------------------------------------------------------------------------------------------------------------------- Dg C-arm 1-60 Min-no Report  Result Date: 04/23/2018 Fluoroscopy was utilized by the requesting physician.  No radiographic interpretation.     Assessment & Plan:   [place] was seen today for back pain.  Diagnoses and all orders for this visit:  Intercostal neuralgia  Acute postthoracotomy pain  Chronic left-sided thoracic back pain  DDD (degenerative disc disease), lumbar -     Lumbar Epidural Injection; Future  Sciatica of left side -     Lumbar Epidural Injection; Future  Other orders -     triamcinolone acetonide (KENALOG-40) injection 40 mg -     sodium chloride flush (NS) 0.9 % injection 10 mL -     ropivacaine (PF) 2 mg/mL (0.2%) (NAROPIN) injection 10 mL -     midazolam (VERSED) 5 MG/5ML injection 5 mg -     lidocaine (PF) (XYLOCAINE) 1 % injection 5 mL -     lactated ringers infusion 1,000 mL -     iopamidol (ISOVUE-M) 41 % intrathecal injection 20 mL        ----------------------------------------------------------------------------------------------------------------------  Problem List Items Addressed This Visit    None    Visit Diagnoses    Intercostal neuralgia    -  Primary   Acute postthoracotomy pain       Chronic left-sided thoracic back pain       Relevant Medications   triamcinolone acetonide (KENALOG-40) injection 40 mg (Completed)    ropivacaine (PF) 2 mg/mL (0.2%) (NAROPIN) injection 10 mL (Completed)   lidocaine (PF) (XYLOCAINE) 1 % injection 5 mL (Completed)   DDD (degenerative disc disease), lumbar       Relevant Medications   triamcinolone acetonide (KENALOG-40) injection 40 mg (Completed)   Other Relevant Orders   Lumbar Epidural Injection   Sciatica of left side       Relevant Medications   midazolam (VERSED) 5 MG/5ML injection 5 mg (Completed)   Other Relevant Orders   Lumbar Epidural Injection        ----------------------------------------------------------------------------------------------------------------------  1. Intercostal neuralgia We will defer on any repeat injections for this at this time.  We have talked about options including radiofrequency or cryoablation of the intercostal nerve for pain relief.  She is to continue with her current medication management at this  time.  2. Acute postthoracotomy pain   3. Chronic left-sided thoracic back pain   4. DDD (degenerative disc disease), lumbar We will proceed with a lumbar epidural steroid injection today.  The risks and benefits of been reviewed with her fully and all questions answered.  We will have her return to clinic in 1 month for repeat injection if indicated at that time. - Lumbar Epidural Injection; Future  5. Sciatica of left side As above - Lumbar Epidural Injection; Future    ----------------------------------------------------------------------------------------------------------------------  I am having [place] Locker maintain her ondansetron, ranitidine, traMADol, sucralfate, omeprazole, lactulose, polyethylene glycol powder, umeclidinium-vilanterol, albuterol, cyclobenzaprine, lidocaine, linaclotide, ALPRAZolam, azithromycin, benzonatate, fluticasone, and guaiFENesin-codeine. We administered triamcinolone acetonide, sodium chloride flush, ropivacaine (PF) 2 mg/mL (0.2%), midazolam, lidocaine (PF), lactated ringers, and  iopamidol.   Meds ordered this encounter  Medications  . triamcinolone acetonide (KENALOG-40) injection 40 mg  . sodium chloride flush (NS) 0.9 % injection 10 mL  . ropivacaine (PF) 2 mg/mL (0.2%) (NAROPIN) injection 10 mL  . midazolam (VERSED) 5 MG/5ML injection 5 mg  . lidocaine (PF) (XYLOCAINE) 1 % injection 5 mL  . lactated ringers infusion 1,000 mL  . iopamidol (ISOVUE-M) 41 % intrathecal injection 20 mL   Patient's Medications  New Prescriptions   No medications on file  Previous Medications   ALBUTEROL (PROVENTIL HFA;VENTOLIN HFA) 108 (90 BASE) MCG/ACT INHALER    Inhale 1-2 puffs into the lungs every 4 (four) hours as needed for wheezing or shortness of breath.   ALPRAZOLAM (XANAX) 0.5 MG TABLET    Take 1 tablet (0.5 mg total) by mouth 3 (three) times daily as needed for sleep or anxiety.   AZITHROMYCIN (ZITHROMAX) 250 MG TABLET       BENZONATATE (TESSALON) 200 MG CAPSULE       CYCLOBENZAPRINE (FLEXERIL) 5 MG TABLET    Take 1-2 tablets 3 times daily as needed   FLUTICASONE (FLONASE) 50 MCG/ACT NASAL SPRAY    Place into the nose.   GUAIFENESIN-CODEINE 100-10 MG/5ML SYRUP    Take by mouth.   LACTULOSE (CEPHULAC) 20 G PACKET    Take 1 packet (20 g total) by mouth 3 (three) times daily.   LIDOCAINE (LIDODERM) 5 %    Place 1 patch onto the skin every 12 (twelve) hours. Remove & Discard patch within 12 hours or as directed by MD   LINACLOTIDE (LINZESS) 72 MCG CAPSULE    Take by mouth.   OMEPRAZOLE (PRILOSEC) 40 MG CAPSULE    Take 1 capsule (40 mg total) by mouth daily for 90 doses.   ONDANSETRON (ZOFRAN) 4 MG TABLET    Take 4 mg by mouth as needed.    POLYETHYLENE GLYCOL POWDER (GLYCOLAX/MIRALAX) POWDER    Take 255 g by mouth daily.   RANITIDINE (ZANTAC) 150 MG TABLET    Take 150 mg by mouth daily.    SUCRALFATE (CARAFATE) 1 G TABLET    Take 1 tablet (1 g total) by mouth 4 (four) times daily.   TRAMADOL (ULTRAM) 50 MG TABLET    Take 50 mg by mouth every 6 (six) hours as needed for  moderate pain.   UMECLIDINIUM-VILANTEROL (ANORO ELLIPTA) 62.5-25 MCG/INH AEPB    Inhale 1 puff into the lungs daily.  Modified Medications   No medications on file  Discontinued Medications   No medications on file   ----------------------------------------------------------------------------------------------------------------------  Follow-up: Return for evaluation, procedure.   Procedure:L 5 S1 LESI with fluoroscopic guidance and with moderate sedation  NOTE: The  risks, benefits, and expectations of the procedure have been discussed and explained to the patient who was understanding and in agreement with suggested treatment plan. No guarantees were made.  DESCRIPTION OF PROCEDURE: Lumbar epidural steroid injection with 3 IV Versed, EKG, blood pressure, pulse, and pulse oximetry monitoring. The procedure was performed with the patient in the prone position under fluoroscopic guidance.  Sterile prep x3 was initiated and I then injected subcutaneous lidocaine to the overlying L5-S1 site after its fluoroscopic identifictation.  Using strict aseptic technique, I then advanced an 18-gauge Tuohy epidural needle in the midline using interlaminar approach via loss-of-resistance to saline technique. There was negative aspiration for heme or  CSF.  I then confirmed position with both AP and Lateral fluoroscan.  2 cc of Isovue were injected and a  total of 5 mL of Preservative-Free normal saline mixed with 40 mg of Kenalog and 1cc Ropicaine 0.2 percent were injected incrementally via the  epidurally placed needle. The needle was removed. The patient tolerated the injection well and was convalesced and discharged to home in stable condition. Should the patient have any post procedure difficulty they have been instructed on how to contact us for assistance.    Molli Barrows, MD

## 2018-05-09 ENCOUNTER — Ambulatory Visit: Payer: BLUE CROSS/BLUE SHIELD | Attending: Internal Medicine

## 2018-05-09 DIAGNOSIS — G4710 Hypersomnia, unspecified: Secondary | ICD-10-CM | POA: Diagnosis not present

## 2018-05-09 DIAGNOSIS — J939 Pneumothorax, unspecified: Secondary | ICD-10-CM | POA: Diagnosis not present

## 2018-05-09 DIAGNOSIS — R918 Other nonspecific abnormal finding of lung field: Secondary | ICD-10-CM | POA: Diagnosis not present

## 2018-05-09 DIAGNOSIS — I2510 Atherosclerotic heart disease of native coronary artery without angina pectoris: Secondary | ICD-10-CM | POA: Insufficient documentation

## 2018-05-09 DIAGNOSIS — Z79891 Long term (current) use of opiate analgesic: Secondary | ICD-10-CM | POA: Diagnosis not present

## 2018-05-09 DIAGNOSIS — R0683 Snoring: Secondary | ICD-10-CM | POA: Diagnosis not present

## 2018-05-13 DIAGNOSIS — G4710 Hypersomnia, unspecified: Secondary | ICD-10-CM | POA: Diagnosis not present

## 2018-05-16 ENCOUNTER — Telehealth: Payer: Self-pay | Admitting: *Deleted

## 2018-05-16 NOTE — Telephone Encounter (Signed)
Pt aware results of split night normal. Nothing further needed.

## 2018-05-29 ENCOUNTER — Ambulatory Visit (INDEPENDENT_AMBULATORY_CARE_PROVIDER_SITE_OTHER): Payer: BLUE CROSS/BLUE SHIELD | Admitting: Gastroenterology

## 2018-05-29 ENCOUNTER — Encounter: Payer: Self-pay | Admitting: Gastroenterology

## 2018-05-29 ENCOUNTER — Other Ambulatory Visit: Payer: Self-pay

## 2018-05-29 VITALS — BP 103/60 | HR 60 | Ht 65.0 in | Wt 112.8 lb

## 2018-05-29 DIAGNOSIS — K219 Gastro-esophageal reflux disease without esophagitis: Secondary | ICD-10-CM | POA: Diagnosis not present

## 2018-05-29 DIAGNOSIS — R1013 Epigastric pain: Secondary | ICD-10-CM | POA: Diagnosis not present

## 2018-05-29 DIAGNOSIS — K5900 Constipation, unspecified: Secondary | ICD-10-CM

## 2018-05-29 MED ORDER — SUCRALFATE 1 G PO TABS: 1.0000 g | ORAL_TABLET | Freq: Four times a day (QID) | ORAL | 0 refills | Status: AC

## 2018-05-29 MED ORDER — PLECANATIDE 3 MG PO TABS: 3.0000 mg | ORAL_TABLET | Freq: Every day | ORAL | 1 refills | Status: AC

## 2018-05-29 MED ORDER — OMEPRAZOLE 40 MG PO CPDR
40.0000 mg | DELAYED_RELEASE_CAPSULE | Freq: Every day | ORAL | 3 refills | Status: DC
Start: 2018-05-29 — End: 2019-02-26

## 2018-05-29 NOTE — Progress Notes (Signed)
[name] MD, MRCP(U.K) [address]  [place]  [place], [place] [address]  Main: [phone]  Fax: [phone]   Primary Care Physician: Letta Median, MD  Primary Gastroenterologist:  Dr. [name]   No chief complaint on file.   HPI: Sheila Suarez is a 62 y.o. female    She is here to see me for abdominal pain.  Summary of history : She has a history of abdominal pain ongoing for many years, upper abdomen, severe constipation and relief after a bowel movement . She has been on narcotics. She had a normal colonoscopy on 06/02/2016 and an EGD which appeared normal but biopsies taken showed active chronic gastritis with H pylori .triple therapy prescribed on 06/16/16 .completed treatment for H pylori.Eradication confirmed subsequently.She is a S/p left thoracotomy and LUL lobectomy .LFT's normal in 05/2016 . PET scan in 01/2016 showed only some mild hypermetabolism in the stomach and rest of GI tract was negative.On trulance commenced at last office visit     Interval history4/17/2019-05/29/18    Trulance worked but she didn't call for a prescription. Says her stomach is swelling. She has a bowel movement only    Stopped all NSAID's. Some pain when she eats has swelling of her stomach . Ran out of prilosec.    BP 103/60   Pulse 60   Ht 5\' 5"  (1.651 m)   Wt 112 lb 12.8 oz (51.2 kg)   BMI 18.77 kg/m     Current Outpatient Medications  Medication Sig Dispense Refill  . albuterol (PROVENTIL HFA;VENTOLIN HFA) 108 (90 Base) MCG/ACT inhaler Inhale 1-2 puffs into the lungs every 4 (four) hours as needed for wheezing or shortness of breath. 1 Inhaler 12  . ALPRAZolam (XANAX) 0.5 MG tablet Take 1 tablet (0.5 mg total) by mouth 3 (three) times daily as needed for sleep or anxiety. 30 tablet 0  . azithromycin (ZITHROMAX) 250 MG tablet   0  . benzonatate (TESSALON) 200 MG capsule   0  . cyclobenzaprine (FLEXERIL) 5 MG tablet Take 1-2 tablets 3 times daily  as needed 20 tablet 0  . fluticasone (FLONASE) 50 MCG/ACT nasal spray Place into the nose.    Marland Kitchen guaiFENesin-codeine 100-10 MG/5ML syrup Take by mouth.    . lactulose (CEPHULAC) 20 g packet Take 1 packet (20 g total) by mouth 3 (three) times daily. 30 each 0  . lidocaine (LIDODERM) 5 % Place 1 patch onto the skin every 12 (twelve) hours. Remove & Discard patch within 12 hours or as directed by MD 10 patch 0  . linaclotide (LINZESS) 72 MCG capsule Take by mouth.    Marland Kitchen omeprazole (PRILOSEC) 40 MG capsule Take 1 capsule (40 mg total) by mouth daily for 90 doses. 90 capsule 0  . ondansetron (ZOFRAN) 4 MG tablet Take 4 mg by mouth as needed.     . polyethylene glycol powder (GLYCOLAX/MIRALAX) powder Take 255 g by mouth daily. 255 g 3  . ranitidine (ZANTAC) 150 MG tablet Take 150 mg by mouth daily.     . sucralfate (CARAFATE) 1 g tablet Take 1 tablet (1 g total) by mouth 4 (four) times daily. 244 tablet 0  . traMADol (ULTRAM) 50 MG tablet Take 50 mg by mouth every 6 (six) hours as needed for moderate pain.    Marland Kitchen umeclidinium-vilanterol (ANORO ELLIPTA) 62.5-25 MCG/INH AEPB Inhale 1 puff into the lungs daily. 1 each 12   No current facility-administered medications for this visit.  Allergies as of 05/29/2018  . (No Known Allergies)    ROS:  General: Negative for anorexia, weight loss, fever, chills, fatigue, weakness. ENT: Negative for hoarseness, difficulty swallowing , nasal congestion. CV: Negative for chest pain, angina, palpitations, dyspnea on exertion, peripheral edema.  Respiratory: Negative for dyspnea at rest, dyspnea on exertion, cough, sputum, wheezing.  GI: See history of present illness. GU:  Negative for dysuria, hematuria, urinary incontinence, urinary frequency, nocturnal urination.  Endo: Negative for unusual weight change.    Physical Examination:   There were no vitals taken for this visit.  General: thin but appears well Eyes: No icterus. Conjunctivae pink. Mouth:  Oropharyngeal mucosa moist and pink , no lesions erythema or exudate. Lungs: Clear to auscultation bilaterally. Non-labored. Heart: Regular rate and rhythm, no murmurs rubs or gallops.  Abdomen: Bowel sounds are normal, nontender, nondistended, no hepatosplenomegaly or masses, no abdominal bruits or hernia , no rebound or guarding.   Extremities: No lower extremity edema. No clubbing or deformities. Neuro: Alert and oriented x 3.  Grossly intact. Skin: Warm and dry, no jaundice.   Psych: Alert and cooperative, normal mood and affect.   Imaging Studies: No results found.  Assessment and Plan:   Sheila Suarez is a 62 y.o. y/o female  here for follow up for IBS-C and GERD. Ran out of her meds and hence returned. She also says she has lost some weight and has some pain after meals, feels like an ulcer she states.   1.Abdominal pain : Restart PPI, script sent,  script sent for carafate advised to call before runs of out meds the next time  2. Irritable bowel syndrome with constipation -Trulance script sent  Some weight loss and worsening of pain after meals- will proceed with EGD and check CMP,CBC,TSH, CT abdomen and pelvis to evaluate for abdominal pain.    I have discussed alternative options, risks & benefits,  which include, but are not limited to, bleeding, infection, perforation,respiratory complication & drug reaction.  The patient agrees with this plan & written consent will be obtained.     Dr [name]  MD,MRCP [facility]) Follow up in 6 weeks

## 2018-05-30 LAB — CBC WITH DIFFERENTIAL/PLATELET
BASOS: 0 %
Basophils Absolute: 0 10*3/uL (ref 0.0–0.2)
EOS (ABSOLUTE): 0.1 10*3/uL (ref 0.0–0.4)
Eos: 2 %
HEMATOCRIT: 40.9 % (ref 34.0–46.6)
HEMOGLOBIN: 12.9 g/dL (ref 11.1–15.9)
IMMATURE GRANULOCYTES: 0 %
Immature Grans (Abs): 0 10*3/uL (ref 0.0–0.1)
Lymphocytes Absolute: 1.7 10*3/uL (ref 0.7–3.1)
Lymphs: 23 %
MCH: 29.1 pg (ref 26.6–33.0)
MCHC: 31.5 g/dL (ref 31.5–35.7)
MCV: 92 fL (ref 79–97)
MONOCYTES: 8 %
MONOS ABS: 0.6 10*3/uL (ref 0.1–0.9)
NEUTROS PCT: 67 %
Neutrophils Absolute: 5.1 10*3/uL (ref 1.4–7.0)
Platelets: 422 10*3/uL (ref 150–450)
RBC: 4.44 x10E6/uL (ref 3.77–5.28)
RDW: 11.6 % — ABNORMAL LOW (ref 12.3–15.4)
WBC: 7.6 10*3/uL (ref 3.4–10.8)

## 2018-05-30 LAB — COMPREHENSIVE METABOLIC PANEL
ALBUMIN: 4.2 g/dL (ref 3.6–4.8)
ALT: 13 IU/L (ref 0–32)
AST: 13 IU/L (ref 0–40)
Albumin/Globulin Ratio: 1.8 (ref 1.2–2.2)
Alkaline Phosphatase: 70 IU/L (ref 39–117)
BUN / CREAT RATIO: 17 (ref 12–28)
BUN: 14 mg/dL (ref 8–27)
Bilirubin Total: 0.2 mg/dL (ref 0.0–1.2)
CALCIUM: 9.7 mg/dL (ref 8.7–10.3)
CO2: 25 mmol/L (ref 20–29)
CREATININE: 0.83 mg/dL (ref 0.57–1.00)
Chloride: 108 mmol/L — ABNORMAL HIGH (ref 96–106)
GFR, EST AFRICAN AMERICAN: 88 mL/min/{1.73_m2} (ref >59)
GFR, EST NON AFRICAN AMERICAN: 76 mL/min/{1.73_m2} (ref >59)
GLOBULIN, TOTAL: 2.4 g/dL (ref 1.5–4.5)
GLUCOSE: 51 mg/dL — AB (ref 65–99)
Potassium: 4.9 mmol/L (ref 3.5–5.2)
SODIUM: 148 mmol/L — AB (ref 134–144)
TOTAL PROTEIN: 6.6 g/dL (ref 6.0–8.5)

## 2018-05-30 LAB — TSH: TSH: 0.538 u[IU]/mL (ref 0.450–4.500)

## 2018-05-31 ENCOUNTER — Ambulatory Visit: Payer: BLUE CROSS/BLUE SHIELD | Admitting: Anesthesiology

## 2018-05-31 ENCOUNTER — Encounter: Payer: Self-pay | Admitting: *Deleted

## 2018-05-31 ENCOUNTER — Encounter
Admission: RE | Disposition: A | Discharge status: Home / Self Care | Payer: Self-pay | Source: Ambulatory Visit | Attending: Gastroenterology

## 2018-05-31 ENCOUNTER — Ambulatory Visit
Admission: RE | Admit: 2018-05-31 | Discharge: 2018-05-31 | Disposition: A | Discharge status: Home / Self Care | Payer: BLUE CROSS/BLUE SHIELD | Source: Ambulatory Visit | Attending: Gastroenterology | Admitting: Gastroenterology

## 2018-05-31 DIAGNOSIS — J449 Chronic obstructive pulmonary disease, unspecified: Secondary | ICD-10-CM | POA: Diagnosis not present

## 2018-05-31 DIAGNOSIS — R109 Unspecified abdominal pain: Secondary | ICD-10-CM | POA: Diagnosis present

## 2018-05-31 DIAGNOSIS — Z87891 Personal history of nicotine dependence: Secondary | ICD-10-CM | POA: Insufficient documentation

## 2018-05-31 DIAGNOSIS — Z902 Acquired absence of lung [part of]: Secondary | ICD-10-CM | POA: Diagnosis not present

## 2018-05-31 DIAGNOSIS — R1013 Epigastric pain: Secondary | ICD-10-CM | POA: Insufficient documentation

## 2018-05-31 DIAGNOSIS — I2510 Atherosclerotic heart disease of native coronary artery without angina pectoris: Secondary | ICD-10-CM | POA: Insufficient documentation

## 2018-05-31 DIAGNOSIS — K219 Gastro-esophageal reflux disease without esophagitis: Secondary | ICD-10-CM | POA: Diagnosis not present

## 2018-05-31 DIAGNOSIS — K319 Disease of stomach and duodenum, unspecified: Secondary | ICD-10-CM | POA: Insufficient documentation

## 2018-05-31 DIAGNOSIS — K2970 Gastritis, unspecified, without bleeding: Secondary | ICD-10-CM | POA: Insufficient documentation

## 2018-05-31 DIAGNOSIS — M329 Systemic lupus erythematosus, unspecified: Secondary | ICD-10-CM | POA: Insufficient documentation

## 2018-05-31 HISTORY — PX: ESOPHAGOGASTRODUODENOSCOPY (EGD) WITH PROPOFOL: SHX5813

## 2018-05-31 SURGERY — ESOPHAGOGASTRODUODENOSCOPY (EGD) WITH PROPOFOL
Anesthesia: General

## 2018-05-31 MED ORDER — LIDOCAINE HCL (PF) 2 % IJ SOLN
INTRAMUSCULAR | Status: AC
  Filled 2018-05-31: qty 10

## 2018-05-31 MED ORDER — PROPOFOL 10 MG/ML IV BOLUS
INTRAVENOUS | Status: DC | PRN
  Administered 2018-05-31 (×2): 80 mg via INTRAVENOUS

## 2018-05-31 MED ORDER — LIDOCAINE HCL (CARDIAC) PF 100 MG/5ML IV SOSY
PREFILLED_SYRINGE | INTRAVENOUS | Status: DC | PRN
  Administered 2018-05-31: 60 mg via INTRAVENOUS

## 2018-05-31 MED ORDER — FENTANYL CITRATE (PF) 100 MCG/2ML IJ SOLN
INTRAMUSCULAR | Status: DC | PRN
  Administered 2018-05-31 (×2): 50 ug via INTRAVENOUS

## 2018-05-31 MED ORDER — SODIUM CHLORIDE 0.9 % IV SOLN
INTRAVENOUS | Status: DC
  Administered 2018-05-31: 09:00:00 via INTRAVENOUS

## 2018-05-31 MED ORDER — FENTANYL CITRATE (PF) 100 MCG/2ML IJ SOLN
INTRAMUSCULAR | Status: AC
  Filled 2018-05-31: qty 2

## 2018-05-31 NOTE — Anesthesia Post-op Follow-up Note (Signed)
Anesthesia QCDR form completed.        

## 2018-05-31 NOTE — Transfer of Care (Signed)
Immediate Anesthesia Transfer of Care Note  Patient: Sheila Suarez  Procedure(s) Performed: ESOPHAGOGASTRODUODENOSCOPY (EGD) WITH PROPOFOL (N/A )  Patient Location: Endoscopy Unit  Anesthesia Type:General  Level of Consciousness: awake, alert  and oriented  Airway & Oxygen Therapy: Patient Spontanous Breathing  Post-op Assessment: Report given to RN and Post -op Vital signs reviewed and stable  Post vital signs: Reviewed and stable  Last Vitals:  Vitals Value Taken Time  BP 119/89 05/31/2018  9:46 AM  Temp 36.2 C 05/31/2018  9:46 AM  Pulse 50 05/31/2018  9:47 AM  Resp 15 05/31/2018  9:47 AM  SpO2 100 % 05/31/2018  9:47 AM  Vitals shown include unvalidated device data.  Last Pain:  Vitals:   05/31/18 0946  TempSrc: Tympanic  PainSc:          Complications: No apparent anesthesia complications

## 2018-05-31 NOTE — Anesthesia Preprocedure Evaluation (Signed)
Anesthesia Evaluation  Patient identified by MRN, date of birth, ID band Patient awake    Reviewed: Allergy & Precautions, NPO status , Patient's Chart, lab work & pertinent test results  History of Anesthesia Complications Negative for: history of anesthetic complications  Airway Mallampati: II       Dental  (+) Upper Dentures, Lower Dentures   Pulmonary neg sleep apnea, COPD,  COPD inhaler, former smoker,           Cardiovascular (-) hypertension(-) CHF (-) dysrhythmias (-) Valvular Problems/Murmurs     Neuro/Psych neg Seizures    GI/Hepatic Neg liver ROS, GERD  Medicated,  Endo/Other  neg diabetes  Renal/GU negative Renal ROS     Musculoskeletal   Abdominal   Peds  Hematology   Anesthesia Other Findings   Reproductive/Obstetrics                            Anesthesia Physical Anesthesia Plan  ASA: II  Anesthesia Plan: General   Post-op Pain Management:    Induction: Intravenous  PONV Risk Score and Plan: 3 and Ondansetron, TIVA and Propofol infusion  Airway Management Planned: Nasal Cannula  Additional Equipment:   Intra-op Plan:   Post-operative Plan:   Informed Consent: I have reviewed the patients History and Physical, chart, labs and discussed the procedure including the risks, benefits and alternatives for the proposed anesthesia with the patient or authorized representative who has indicated his/her understanding and acceptance.     Plan Discussed with:   Anesthesia Plan Comments:         Anesthesia Quick Evaluation

## 2018-05-31 NOTE — Anesthesia Postprocedure Evaluation (Signed)
Anesthesia Post Note  Patient: Sheila Suarez  Procedure(s) Performed: ESOPHAGOGASTRODUODENOSCOPY (EGD) WITH PROPOFOL (N/A )  Patient location during evaluation: Endoscopy Anesthesia Type: General Level of consciousness: awake and alert Pain management: pain level controlled Vital Signs Assessment: post-procedure vital signs reviewed and stable Respiratory status: spontaneous breathing and respiratory function stable Cardiovascular status: stable Anesthetic complications: no     Last Vitals:  Vitals:   05/31/18 0842 05/31/18 0946  BP: 129/76 119/76  Pulse: (!) 52   Resp: 16   Temp: (!) 35.7 C (!) 36.2 C  SpO2: 100%     Last Pain:  Vitals:   05/31/18 0946  TempSrc: Tympanic  PainSc:                  [name] K

## 2018-05-31 NOTE — Op Note (Signed)
[facility] Gastroenterology Patient Name: Sheila Suarez Procedure Date: 05/31/2018 9:01 AM MRN: [id number] Account #: [id number] Date of Birth: 1955/10/19 Admit Type: Outpatient Age: 62 Room: [facility] ENDO ROOM 4 Gender: Female Note Status: Finalized Procedure:            Upper GI endoscopy Indications:          Abdominal pain Providers:            [name] MD, MD Referring MD:         Baxter Kail. Rebeca Alert MD, MD (Referring MD) Medicines:            Monitored Anesthesia Care Complications:        No immediate complications. Procedure:            Pre-Anesthesia Assessment:                       - Prior to the procedure, a History and Physical was                        performed, and patient medications, allergies and                        sensitivities were reviewed. The patient's tolerance of                        previous anesthesia was reviewed.                       - The risks and benefits of the procedure and the                        sedation options and risks were discussed with the                        patient. All questions were answered and informed                        consent was obtained.                       - ASA Grade Assessment: III - A patient with severe                        systemic disease.                       After obtaining informed consent, the endoscope was                        passed under direct vision. Throughout the procedure,                        the patient's blood pressure, pulse, and oxygen                        saturations were monitored continuously. The Endoscope                        was introduced through the mouth, and advanced to the  third part of duodenum. The upper GI endoscopy was                        accomplished with ease. The patient tolerated the                        procedure well. Findings:      The examined duodenum was normal.      The esophagus was normal.      The  cardia and gastric fundus were normal on retroflexion.      Diffuse mild inflammation characterized by congestion (edema) and       erythema was found in the gastric body and in the gastric antrum.       Biopsies were taken with a cold forceps for histology. Impression:           - Normal examined duodenum.                       - Normal esophagus.                       - Gastritis. Biopsied. Recommendation:       - Discharge patient to home (with escort).                       - Resume previous diet.                       - Continue present medications.                       - Await pathology results.                       - Return to my office in 4 weeks. Procedure Code(s):    --- Professional ---                       [id number], Esophagogastroduodenoscopy, flexible, transoral;                        with biopsy, single or multiple Diagnosis Code(s):    --- Professional ---                       K29.70, Gastritis, unspecified, without bleeding                       R10.9, Unspecified abdominal pain CPT copyright 2018 American Medical Association. All rights reserved. The codes documented in this report are preliminary and upon coder review may  be revised to meet current compliance requirements. [name], MD [name] MD, MD 05/31/2018 9:38:18 AM This report has been signed electronically. Number of Addenda: 0 Note Initiated On: 05/31/2018 9:01 AM      [facility]

## 2018-05-31 NOTE — H&P (Signed)
[name], MD [address] [place], [place], [place], [address] [address], [place], [place], [place], [address] Phone: [phone]  Fax: [phone]  Primary Care Physician:  Letta Median, MD   Pre-Procedure History & Physical: HPI:  Sheila Suarez is a 62 y.o. female is here for an endoscopy    Past Medical History:  Diagnosis Date  . Cancer ([place])    lung  . Coronary artery disease   . Emphysema of lung ([place])   . GERD (gastroesophageal reflux disease)   . Lupus ([place])   . Shortness of breath dyspnea   . Sinus infection    9/19    Past Surgical History:  Procedure Laterality Date  . COLONOSCOPY WITH PROPOFOL N/A 06/02/2016   Procedure: COLONOSCOPY WITH PROPOFOL;  Surgeon: [name], MD;  Location: [facility] ENDOSCOPY;  Service: Gastroenterology;  Laterality: N/A;  . CYST EXCISION Left    hand  . ESOPHAGOGASTRODUODENOSCOPY (EGD) WITH PROPOFOL N/A 06/02/2016   Procedure: ESOPHAGOGASTRODUODENOSCOPY (EGD) WITH PROPOFOL;  Surgeon: [name], MD;  Location: [facility] ENDOSCOPY;  Service: Gastroenterology;  Laterality: N/A;  . FLEXIBLE BRONCHOSCOPY N/A 04/17/2016   Procedure: FLEXIBLE BRONCHOSCOPY;  Surgeon: [name], MD;  Location: [facility] ORS;  Service: Thoracic;  Laterality: N/A;  . HAND SURGERY Left   . left upper lobectomy Left 03/2016  . LUNG REMOVAL, PARTIAL Left    Left lower lobe  . THORACOTOMY Left 04/17/2016   Procedure: THORACOTOMY LEFT UPPER LOBE LUNG RESECTION;  Surgeon: [name], MD;  Location: [facility] ORS;  Service: Thoracic;  Laterality: Left;  . TUBAL LIGATION Bilateral    37 years ago    Prior to Admission medications   Medication Sig Start Date End Date Taking? Authorizing Provider  albuterol (PROVENTIL HFA;VENTOLIN HFA) 108 (90 Base) MCG/ACT inhaler Inhale 1-2 puffs into the lungs every 4 (four) hours as needed for wheezing or shortness of breath. 12/06/17  Yes Flora Lipps, MD  ALPRAZolam Duanne Moron) 0.5 MG tablet Take 1 tablet (0.5 mg total)  by mouth 3 (three) times daily as needed for sleep or anxiety. 03/29/18 03/29/19 Yes Flora Lipps, MD  cyclobenzaprine (FLEXERIL) 5 MG tablet Take 1-2 tablets 3 times daily as needed 02/07/18  Yes Laban Emperor, PA-C  lactulose (CEPHULAC) 20 g packet Take 1 packet (20 g total) by mouth 3 (three) times daily. 11/28/17  Yes [name], MD  lidocaine (LIDODERM) 5 % Place 1 patch onto the skin every 12 (twelve) hours. Remove & Discard patch within 12 hours or as directed by MD 02/07/18 02/07/19 Yes Laban Emperor, PA-C  linaclotide [facility]) 72 MCG capsule Take by mouth. 08/14/16  Yes [provider]  omeprazole (PRILOSEC) 40 MG capsule Take 1 capsule (40 mg total) by mouth daily. 05/29/18  Yes [name], MD  ondansetron (ZOFRAN) 4 MG tablet Take 4 mg by mouth as needed.  05/10/16  Yes [provider]  Plecanatide (TRULANCE) 3 MG TABS Take 3 mg by mouth daily. 05/29/18 07/29/18 Yes [name], MD  ranitidine (ZANTAC) 150 MG tablet Take 150 mg by mouth daily.  06/05/16  Yes [provider]  sucralfate (CARAFATE) 1 g tablet Take 1 tablet (1 g total) by mouth 4 (four) times daily. 05/29/18 07/29/18 Yes [name], MD  traMADol (ULTRAM) 50 MG tablet Take 50 mg by mouth every 6 (six) hours as needed for moderate pain.   Yes [provider]  umeclidinium-vilanterol (ANORO ELLIPTA) 62.5-25 MCG/INH AEPB Inhale 1 puff into the lungs daily. 12/06/17  Yes Flora Lipps, MD  azithromycin (ZITHROMAX) 250 MG tablet  04/19/18   [provider]  benzonatate (TESSALON) 200 MG capsule  04/19/18   [provider]  fluticasone (FLONASE) 50 MCG/ACT nasal spray Place into the nose. 04/19/18   [provider]  guaiFENesin-codeine 100-10 MG/5ML syrup Take by mouth. 04/19/18   [provider]  omeprazole (PRILOSEC) 40 MG capsule Take 1 capsule (40 mg total) by mouth daily for 90 doses. 11/14/17 03/29/18  [name], MD  polyethylene glycol powder (GLYCOLAX/MIRALAX)  powder Take 255 g by mouth daily. Patient not taking: Reported on 05/31/2018 11/30/17   [name], MD    Allergies as of 05/29/2018  . (No Known Allergies)    Family History  Problem Relation Age of Onset  . Pancreatic cancer Sister   . Lung cancer Sister   . Lung cancer Brother   . Stomach cancer Paternal Grandmother   . Heart disease Father   . Cancer Daughter 47       cervical  . Cancer Maternal Aunt        brain  . Cancer Maternal Uncle        prostate  . Cancer Maternal Aunt        bone    Social History   Socioeconomic History  . Marital status: Divorced    Spouse name: Not on file  . Number of children: Not on file  . Years of education: Not on file  . Highest education level: Not on file  Occupational History  . Not on file  Social Needs  . Financial resource strain: Not on file  . Food insecurity:    Worry: Not on file    Inability: Not on file  . Transportation needs:    Medical: Not on file    Non-medical: Not on file  Tobacco Use  . Smoking status: Former Smoker    Packs/day: 0.25    Years: 35.00    Pack years: 8.75    Types: Cigarettes    Last attempt to quit: 03/19/2016    Years since quitting: 2.2  . Smokeless tobacco: Never Used  Substance and Sexual Activity  . Alcohol use: Yes    Comment: rarely  . Drug use: No  . Sexual activity: Not Currently  Lifestyle  . Physical activity:    Days per week: Not on file    Minutes per session: Not on file  . Stress: Not on file  Relationships  . Social connections:    Talks on phone: Not on file    Gets together: Not on file    Attends religious service: Not on file    Active member of club or organization: Not on file    Attends meetings of clubs or organizations: Not on file    Relationship status: Not on file  . Intimate partner violence:    Fear of current or ex partner: Not on file    Emotionally abused: Not on file    Physically abused: Not on file    Forced sexual activity: Not on  file  Other Topics Concern  . Not on file  Social History Narrative  . Not on file    Review of Systems: See HPI, otherwise negative ROS  Physical Exam: BP 129/76   Pulse (!) 52   Temp (!) 96.2 F (35.7 C) (Tympanic)   Resp 16   Ht 5\' 5"  (1.651 m)   Wt 50.8 kg   SpO2 100%  BMI 18.64 kg/m  General:   Alert,  pleasant and cooperative in NAD Head:  Normocephalic and atraumatic. Neck:  Supple; no masses or thyromegaly. Lungs:  Clear throughout to auscultation, normal respiratory effort.    Heart:  +S1, +S2, Regular rate and rhythm, No edema. Abdomen:  Soft, nontender and nondistended. Normal bowel sounds, without guarding, and without rebound.   Neurologic:  Alert and  oriented x4;  grossly normal neurologically.  Impression/Plan: [name] is here for an endoscopy  to be performed for  evaluation of abdominal pain    Risks, benefits, limitations, and alternatives regarding endoscopy have been reviewed with the patient.  Questions have been answered.  All parties agreeable.   [name], MD  05/31/2018, 8:58 AM

## 2018-06-03 ENCOUNTER — Encounter: Payer: Self-pay | Admitting: Gastroenterology

## 2018-06-04 ENCOUNTER — Ambulatory Visit: Admission: RE | Admit: 2018-06-04 | Payer: BLUE CROSS/BLUE SHIELD | Source: Ambulatory Visit

## 2018-06-04 LAB — SURGICAL PATHOLOGY

## 2018-06-07 ENCOUNTER — Encounter: Payer: Self-pay | Admitting: Gastroenterology

## 2018-06-07 ENCOUNTER — Ambulatory Visit: Admission: RE | Admit: 2018-06-07 | Payer: BLUE CROSS/BLUE SHIELD | Source: Ambulatory Visit

## 2018-06-10 ENCOUNTER — Ambulatory Visit: Payer: BLUE CROSS/BLUE SHIELD | Admitting: Anesthesiology

## 2018-06-12 ENCOUNTER — Ambulatory Visit: Payer: BLUE CROSS/BLUE SHIELD

## 2018-06-21 ENCOUNTER — Other Ambulatory Visit: Payer: Self-pay

## 2018-06-21 ENCOUNTER — Telehealth: Payer: Self-pay

## 2018-06-21 DIAGNOSIS — R1013 Epigastric pain: Secondary | ICD-10-CM

## 2018-06-21 DIAGNOSIS — R634 Abnormal weight loss: Secondary | ICD-10-CM

## 2018-06-21 NOTE — Telephone Encounter (Signed)
Spoke with pt and informed her of Dr. Georgeann Oppenheim instructions for pt to have x-rays performed, as required by pt insurance, so that pt will be eligible for the CT Abdomen/Pelvis. Pt understands and agrees.

## 2018-06-24 ENCOUNTER — Ambulatory Visit
Admission: RE | Admit: 2018-06-24 | Discharge: 2018-06-24 | Disposition: A | Discharge status: Home / Self Care | Payer: BLUE CROSS/BLUE SHIELD | Source: Ambulatory Visit | Attending: Gastroenterology | Admitting: Gastroenterology

## 2018-06-24 DIAGNOSIS — R1013 Epigastric pain: Secondary | ICD-10-CM | POA: Insufficient documentation

## 2018-06-24 DIAGNOSIS — R634 Abnormal weight loss: Secondary | ICD-10-CM | POA: Diagnosis present

## 2018-07-08 ENCOUNTER — Other Ambulatory Visit: Payer: Self-pay | Admitting: Anesthesiology

## 2018-07-08 ENCOUNTER — Encounter: Payer: Self-pay | Admitting: Anesthesiology

## 2018-07-08 ENCOUNTER — Other Ambulatory Visit: Payer: Self-pay

## 2018-07-08 ENCOUNTER — Ambulatory Visit (HOSPITAL_BASED_OUTPATIENT_CLINIC_OR_DEPARTMENT_OTHER): Payer: BLUE CROSS/BLUE SHIELD | Admitting: Anesthesiology

## 2018-07-08 ENCOUNTER — Ambulatory Visit
Admission: RE | Admit: 2018-07-08 | Discharge: 2018-07-08 | Disposition: A | Discharge status: Home / Self Care | Payer: BLUE CROSS/BLUE SHIELD | Source: Ambulatory Visit | Attending: Anesthesiology | Admitting: Anesthesiology

## 2018-07-08 VITALS — BP 149/87 | HR 64 | Temp 98.0°F | Resp 16 | Ht 65.0 in | Wt 112.0 lb

## 2018-07-08 DIAGNOSIS — M546 Pain in thoracic spine: Secondary | ICD-10-CM

## 2018-07-08 DIAGNOSIS — Z79899 Other long term (current) drug therapy: Secondary | ICD-10-CM | POA: Diagnosis not present

## 2018-07-08 DIAGNOSIS — M5136 Other intervertebral disc degeneration, lumbar region: Secondary | ICD-10-CM | POA: Insufficient documentation

## 2018-07-08 DIAGNOSIS — M5432 Sciatica, left side: Secondary | ICD-10-CM | POA: Diagnosis not present

## 2018-07-08 DIAGNOSIS — G8912 Acute post-thoracotomy pain: Secondary | ICD-10-CM

## 2018-07-08 DIAGNOSIS — R52 Pain, unspecified: Secondary | ICD-10-CM

## 2018-07-08 DIAGNOSIS — G8929 Other chronic pain: Secondary | ICD-10-CM

## 2018-07-08 DIAGNOSIS — M62838 Other muscle spasm: Secondary | ICD-10-CM | POA: Diagnosis not present

## 2018-07-08 DIAGNOSIS — G588 Other specified mononeuropathies: Secondary | ICD-10-CM

## 2018-07-08 MED ORDER — IOPAMIDOL (ISOVUE-M 200) INJECTION 41%
INTRAMUSCULAR | Status: AC
  Filled 2018-07-08: qty 10

## 2018-07-08 MED ORDER — TRIAMCINOLONE ACETONIDE 40 MG/ML IJ SUSP
40.0000 mg | Freq: Once | INTRAMUSCULAR | Status: AC
  Administered 2018-07-08: 40 mg
  Filled 2018-07-08: qty 1

## 2018-07-08 MED ORDER — MIDAZOLAM HCL 5 MG/5ML IJ SOLN
5.0000 mg | Freq: Once | INTRAMUSCULAR | Status: AC
  Administered 2018-07-08: 2 mg via INTRAVENOUS
  Filled 2018-07-08: qty 5

## 2018-07-08 MED ORDER — IOPAMIDOL (ISOVUE-M 200) INJECTION 41%
20.0000 mL | Freq: Once | INTRAMUSCULAR | Status: DC | PRN
  Administered 2018-07-08: 10 mL
  Filled 2018-07-08: qty 20

## 2018-07-08 MED ORDER — ROPIVACAINE HCL 2 MG/ML IJ SOLN
10.0000 mL | Freq: Once | INTRAMUSCULAR | Status: AC
  Administered 2018-07-08: 10 mL via EPIDURAL
  Filled 2018-07-08: qty 10

## 2018-07-08 MED ORDER — LIDOCAINE HCL (PF) 1 % IJ SOLN
5.0000 mL | Freq: Once | INTRAMUSCULAR | Status: AC
  Administered 2018-07-08: 5 mL via SUBCUTANEOUS
  Filled 2018-07-08: qty 5

## 2018-07-08 MED ORDER — LACTATED RINGERS IV SOLN
1000.0000 mL | INTRAVENOUS | Status: DC
  Administered 2018-07-08: 1000 mL via INTRAVENOUS

## 2018-07-08 MED ORDER — TRAMADOL HCL 50 MG PO TABS: 50.0000 mg | ORAL_TABLET | Freq: Two times a day (BID) | ORAL | 2 refills | Status: AC

## 2018-07-08 MED ORDER — SODIUM CHLORIDE 0.9% FLUSH
10.0000 mL | Freq: Once | INTRAVENOUS | Status: AC
  Administered 2018-07-08: 10 mL

## 2018-07-08 NOTE — Patient Instructions (Addendum)
You have been given a script for Tramadol today   Preparing for Procedure with Sedation Instructions: . Oral Intake: Do not eat or drink anything for at least 8 hours prior to your procedure. . Transportation: Public transportation is not allowed. Bring an adult driver. The driver must be physically present in our waiting room before any procedure can be started. Marland Kitchen Physical Assistance: Bring an adult capable of physically assisting you, in the event you need help. . Blood Pressure Medicine: Take your blood pressure medicine with a sip of water the morning of the procedure. . Insulin: Take only  of your normal insulin dose. . Preventing infections: Shower with an antibacterial soap the morning of your procedure. . Build-up your immune system: Take 1000 mg of Vitamin C with every meal (3 times a day) the day prior to your procedure. . Pregnancy: If you are pregnant, call and cancel the procedure. . Sickness: If you have a cold, fever, or any active infections, call and cancel the procedure. . Arrival: You must be in the facility at least 30 minutes prior to your scheduled procedure. . Children: Do not bring children with you. . Dress appropriately: Bring dark clothing that you would not mind if they get stained. . Valuables: Do not bring any jewelry or valuables. Procedure appointments are reserved for interventional treatments only. Marland Kitchen No Prescription Refills. . No medication changes will be discussed during procedure appointments. No disability issues will be discussed. Epidural Steroid Injection An epidural steroid injection is given to relieve pain in your neck, back, or legs that is caused by the irritation or swelling of a nerve root. This procedure involves injecting a steroid and numbing medicine (anesthetic) into the epidural space. The epidural space is the space between the outer covering of your spinal cord and the bones that form your backbone (vertebra).  LET [facility] CARE  PROVIDER KNOW ABOUT:  Any allergies you have. All medicines you are taking, including vitamins, herbs, eye drops, creams, and over-the-counter medicines such as aspirin. Previous problems you or members of your family have had with the use of anesthetics. Any blood disorders or blood clotting disorders you have. Previous surgeries you have had. Medical conditions you have.  RISKS AND COMPLICATIONS Generally, this is a safe procedure. However, as with any procedure, complications can occur. Possible complications of epidural steroid injection include: Headache. Bleeding. Infection. Allergic reaction to the medicines. Damage to your nerves. The response to this procedure depends on the underlying cause of the pain and its duration. People who have long-term (chronic) pain are less likely to benefit from epidural steroids than are those people whose pain comes on strong and suddenly.  BEFORE THE PROCEDURE  Ask your health care provider about changing or stopping your regular medicines. You may be advised to stop taking blood-thinning medicines a few days before the procedure. You may be given medicines to reduce anxiety. Arrange for someone to take you home after the procedure.  PROCEDURE  You will remain awake during the procedure. You may receive medicine to make you relaxed. You will be asked to lie on your stomach. The injection site will be cleaned. The injection site will be numbed with a medicine (local anesthetic). A needle will be injected through your skin into the epidural space. Your health care provider will use an X-ray machine to ensure that the steroid is delivered closest to the affected nerve. You may have minimal discomfort at this time. Once the needle is in the right  position, the local anesthetic and the steroid will be injected into the epidural space. The needle will then be removed and a bandage will be applied to the injection site.  AFTER THE PROCEDURE  You  may be monitored for a short time before you go home. You may feel weakness or numbness in your arm or leg, which disappears within hours. You may be allowed to eat, drink, and take your regular medicine. You may have soreness at the site of the injection.   This information is not intended to replace advice given to you by your health care provider. Make sure you discuss any questions you have with your health care provider.   Document Released: 10/24/2007 Document Revised: 03/19/2013 Document Reviewed: 01/03/2013 Elsevier Interactive Patient Education 2016 [place]  What are the risk, side effects and possible complications? Generally speaking, most procedures are safe.  However, with any procedure there are risks, side effects, and the possibility of complications.  The risks and complications are dependent upon the sites that are lesioned, or the type of nerve block to be performed.  The closer the procedure is to the spine, the more serious the risks are.  Great care is taken when placing the radio frequency needles, block needles or lesioning probes, but sometimes complications can occur. Infection: Any time there is an injection through the skin, there is a risk of infection.  This is why sterile conditions are used for these blocks. There are four possible types of infection: 1. Localized skin infection. 2. Central Nervous System Infection: This can be in the form of Meningitis, which can be deadly. 3. Epidural Infections: This can be in the form of an epidural abscess, which can cause pressure inside of the spine, causing compression of the spinal cord with subsequent paralysis. This would require an emergency surgery to decompress, and there are no guarantees that the patient would recover from the paralysis. 4. Discitis: This is an infection of the intervertebral discs. It occurs in about 1% of discography procedures. It is difficult to treat and it  may lead to surgery. Pain: the needles have to go through skin and soft tissues, will cause soreness. Damage to internal structures:  The nerves to be lesioned may be near blood vessels or other nerves which can be potentially damaged. Bleeding: Bleeding is more common if the patient is taking blood thinners such as  aspirin, Coumadin, Ticiid, Plavix, etc., or if he/she have some genetic predisposition such as hemophilia. Bleeding into the spinal canal can cause compression of the spinal  cord with subsequent paralysis.  This would require an emergency surgery to decompress and there are no guarantees that the patient would recover from the paralysis. Pneumothorax: Puncturing of a lung is a possibility, every time a needle is introduced in the area of the chest or upper back.  Pneumothorax refers to free air around the collapsed lung(s), inside of the thoracic cavity (chest cavity).  Another two possible complications related to a similar event would include: Hemothorax and Chylothorax. These are variations of the Pneumothorax, where instead of air around the collapsed lung(s), you may have blood or chyle, respectively. Spinal headaches: They may occur with any procedures in the area of the spine. Persistent CSF (Cerebro-Spinal Fluid) leakage: This is a rare problem, but may occur with prolonged intrathecal or epidural catheters either due to the formation of a fistulous track or a dural tear. Nerve damage: By working so close to the spinal  cord, there is always a possibility of nerve damage, which could be as serious as a permanent spinal cord injury with paralysis. Death: Although rare, severe deadly allergic reactions known as "Anaphylactic reaction" can occur to any of the medications used. Worsening of the symptoms: We can always make thing worse.  What are the chances of something like this happening? Chances of any of this occuring are extremely low.  By statistics, you have more of a chance of  getting killed in a motor vehicle accident: while driving to the hospital than any of the above occurring .  Nevertheless, you should be aware that they are possibilities.  In general, it is similar to taking a shower.  Everybody knows that you can slip, hit your head and get killed.  Does that mean that you should not shower again?  Nevertheless always keep in mind that statistics do not mean anything if you happen to be on the wrong side of them.  Even if a procedure has a 1 (one) in a 1,000,000 (million) chance of going wrong, it you happen to be that one..Also, keep in mind that by statistics, you have more of a chance of having something go wrong when taking medications.  Who should not have this procedure? If you are on a blood thinning medication (e.g. Coumadin, Plavix, see list of "Blood Thinners"), or if you have an active infection going on, you should not have the procedure.  If you are taking any blood thinners, please inform your physician.  Preparing for your procedure: Do not eat or drink anything at least eight (8) hours prior to the procedure. Bring a driver with you .  It cannot be a taxi. Come accompanied by an adult that can drive you back, and that is strong enough to help you if your legs get weak or numb from the local anesthetic. Take all of your medicines the morning of the procedure with just enough water to swallow them. If you have diabetes, make sure that you are scheduled to have your procedure done first thing in the morning, whenever possible. If you have diabetes, take only half of your insulin dose and notify our nurse that you have done so as soon as you arrive at the clinic. If you are diabetic, but only take blood sugar pills (oral hypoglycemic), then do not take them on the morning of your procedure.  You may take them after you have had the procedure. Do not take aspirin or any aspirin-containing medications, at least eleven (11) days prior to the procedure.  They  may prolong bleeding. Wear loose fitting clothing that may be easy to take off and that you would not mind if it got stained with Betadine or blood. Do not wear any jewelry or perfume Remove any nail coloring.  It will interfere with some of our monitoring equipment. If you take Metformin for your diabetes, stop it 48 hours prior to the procedure.  NOTE: Remember that this is not meant to be interpreted as a complete list of all possible complications.  Unforeseen problems may occur.  BLOOD THINNERS The following drugs contain aspirin or other products, which can cause increased bleeding during surgery and should not be taken for 2 weeks prior to and 1 week after surgery.  If you should need take something for relief of minor pain, you may take acetaminophen which is found in Tylenol,m Datril, Anacin-3 and Panadol. It is not blood thinner. The products listed below are.  Do  not take any of the products listed below in addition to any listed on your instruction sheet.  A.P.C or A.P.C with Codeine Codeine Phosphate Capsules #3 Ibuprofen Ridaura  ABC compound Congesprin Imuran rimadil  Advil Cope Indocin Robaxisal  Alka-Seltzer Effervescent Pain Reliever and Antacid Coricidin or Coricidin-D  Indomethacin Rufen  Alka-Seltzer plus Cold Medicine Cosprin Ketoprofen S-A-C Tablets  Anacin Analgesic Tablets or Capsules Coumadin Korlgesic Salflex  Anacin Extra Strength Analgesic tablets or capsules CP-2 Tablets Lanoril Salicylate  Anaprox Cuprimine Capsules Levenox Salocol  Anexsia-D Dalteparin Magan Salsalate  Anodynos Darvon compound Magnesium Salicylate Sine-off  Ansaid Dasin Capsules Magsal Sodium Salicylate  Anturane Depen Capsules Marnal Soma  APF Arthritis pain formula Dewitt's Pills Measurin Stanback  Argesic Dia-Gesic Meclofenamic Sulfinpyrazone  Arthritis Bayer Timed Release Aspirin Diclofenac Meclomen Sulindac  Arthritis pain formula Anacin Dicumarol Medipren Supac  Analgesic (Safety  coated) Arthralgen Diffunasal Mefanamic Suprofen  Arthritis Strength Bufferin Dihydrocodeine Mepro Compound Suprol  Arthropan liquid Dopirydamole Methcarbomol with Aspirin Synalgos  ASA tablets/Enseals Disalcid Micrainin Tagament  Ascriptin Doan's Midol Talwin  Ascriptin A/D Dolene Mobidin Tanderil  Ascriptin Extra Strength Dolobid Moblgesic Ticlid  Ascriptin with Codeine Doloprin or Doloprin with Codeine Momentum Tolectin  Asperbuf Duoprin Mono-gesic Trendar  Aspergum Duradyne Motrin or Motrin IB Triminicin  Aspirin plain, buffered or enteric coated Durasal Myochrisine Trigesic  Aspirin Suppositories Easprin Nalfon Trillsate  Aspirin with Codeine Ecotrin Regular or Extra Strength Naprosyn Uracel  Atromid-S Efficin Naproxen Ursinus  Auranofin Capsules Elmiron Neocylate Vanquish  Axotal Emagrin Norgesic Verin  Azathioprine Empirin or Empirin with Codeine Normiflo Vitamin E  Azolid Emprazil Nuprin Voltaren  Bayer Aspirin plain, buffered or children's or timed BC Tablets or powders Encaprin Orgaran Warfarin Sodium  Buff-a-Comp Enoxaparin Orudis Zorpin  Buff-a-Comp with Codeine Equegesic Os-Cal-Gesic   Buffaprin Excedrin plain, buffered or Extra Strength Oxalid   Bufferin Arthritis Strength Feldene Oxphenbutazone   Bufferin plain or Extra Strength Feldene Capsules Oxycodone with Aspirin   Bufferin with Codeine Fenoprofen Fenoprofen Pabalate or Pabalate-SF   Buffets II Flogesic Panagesic   Buffinol plain or Extra Strength Florinal or Florinal with Codeine Panwarfarin   Buf-Tabs Flurbiprofen Penicillamine   Butalbital Compound Four-way cold tablets Penicillin   Butazolidin Fragmin Pepto-Bismol   Carbenicillin Geminisyn Percodan   Carna Arthritis Reliever Geopen Persantine   Carprofen Gold's salt Persistin   Chloramphenicol Goody's Phenylbutazone   Chloromycetin Haltrain Piroxlcam   Clmetidine heparin Plaquenil   Cllnoril Hyco-pap Ponstel   Clofibrate Hydroxy chloroquine Propoxyphen          . Before stopping any of these medications, be sure to consult the physician who ordered them.  Some, such as Coumadin (Warfarin) are ordered to prevent or treat serious conditions such as "deep thrombosis", "pumonary embolisms", and other heart problems.  The amount of time that you may need off of the medication may also vary with the medication and the reason for which you were taking it.  If you are taking any of these medications, please make sure you notify your pain physician before you undergo any procedures.

## 2018-07-08 NOTE — Progress Notes (Signed)
Safety precautions to be maintained throughout the outpatient stay will include: orient to surroundings, keep bed in low position, maintain call bell within reach at all times, provide assistance with transfer out of bed and ambulation.  

## 2018-07-09 NOTE — Progress Notes (Signed)
Subjective:  Patient ID: Sheila Suarez, female    DOB: September 28, 1955  Age: 62 y.o. MRN: [id number]  CC: Procedure (lumbar epidural #2)   Procedure: L 5 S1 epidural steroid under fluoroscopic guidance with moderate sedation  HPI [name] presents for reevaluation.  She was last seen a few months ago and continues to have severe low back pain with radiation into the left greater than right lower leg.  The quality characteristic distribution are stable in comparison to what she is felt in the past.  No changes in lower extremity strength or function or bowel bladder function are noted at this time.  She is taking her medications as prescribed using Tylenol and occasional anti-inflammatory drugs in addition to Flexeril for occasional muscle spasms.  Otherwise she is in her usual state of health regarding her low back pain.  She would like to undergo an epidural steroid injection today.  She is also having persistent problems with her left lateral thoracic pain consistent with what she is experienced in the past.  The quality of this is been stable in nature with no significant changes noted.  Despite physical therapy and previous injections this pain is persistent.  No changes in pulmonary function or the quality of the pain are noted.  Outpatient Medications Prior to Visit  Medication Sig Dispense Refill  . albuterol (PROVENTIL HFA;VENTOLIN HFA) 108 (90 Base) MCG/ACT inhaler Inhale 1-2 puffs into the lungs every 4 (four) hours as needed for wheezing or shortness of breath. 1 Inhaler 12  . ALPRAZolam (XANAX) 0.5 MG tablet Take 1 tablet (0.5 mg total) by mouth 3 (three) times daily as needed for sleep or anxiety. 30 tablet 0  . cyclobenzaprine (FLEXERIL) 5 MG tablet Take 1-2 tablets 3 times daily as needed 20 tablet 0  . lactulose (CEPHULAC) 20 g packet Take 1 packet (20 g total) by mouth 3 (three) times daily. 30 each 0  . lidocaine (LIDODERM) 5 % Place 1 patch onto the skin every 12  (twelve) hours. Remove & Discard patch within 12 hours or as directed by MD 10 patch 0  . linaclotide (LINZESS) 72 MCG capsule Take by mouth.    Marland Kitchen omeprazole (PRILOSEC) 40 MG capsule Take 1 capsule (40 mg total) by mouth daily. 90 capsule 3  . ondansetron (ZOFRAN) 4 MG tablet Take 4 mg by mouth as needed.     Marland Kitchen Plecanatide (TRULANCE) 3 MG TABS Take 3 mg by mouth daily. 90 tablet 1  . ranitidine (ZANTAC) 150 MG tablet Take 150 mg by mouth daily.     . sucralfate (CARAFATE) 1 g tablet Take 1 tablet (1 g total) by mouth 4 (four) times daily. 244 tablet 0  . umeclidinium-vilanterol (ANORO ELLIPTA) 62.5-25 MCG/INH AEPB Inhale 1 puff into the lungs daily. 1 each 12  . traMADol (ULTRAM) 50 MG tablet Take 50 mg by mouth every 6 (six) hours as needed for moderate pain.    Marland Kitchen azithromycin (ZITHROMAX) 250 MG tablet   0  . benzonatate (TESSALON) 200 MG capsule   0  . fluticasone (FLONASE) 50 MCG/ACT nasal spray Place into the nose.    Marland Kitchen guaiFENesin-codeine 100-10 MG/5ML syrup Take by mouth.    Marland Kitchen omeprazole (PRILOSEC) 40 MG capsule Take 1 capsule (40 mg total) by mouth daily for 90 doses. 90 capsule 0  . polyethylene glycol powder (GLYCOLAX/MIRALAX) powder Take 255 g by mouth daily. (Patient not taking: Reported on 05/31/2018) 255 g 3   No facility-administered medications prior to  visit.     Review of Systems CNS: No confusion or sedation Cardiac: No angina or palpitations GI: No abdominal pain or constipation Constitutional: No nausea vomiting fevers or chills  Objective:  BP (!) 149/87   Pulse 64   Temp 98 F (36.7 C)   Resp 16   Ht 5\' 5"  (1.651 m)   Wt 112 lb (50.8 kg)   SpO2 100%   BMI 18.64 kg/m    BP Readings from Last 3 Encounters:  07/08/18 (!) 149/87  05/31/18 (!) 143/70  05/29/18 103/60     Wt Readings from Last 3 Encounters:  07/08/18 112 lb (50.8 kg)  05/31/18 112 lb (50.8 kg)  05/29/18 112 lb 12.8 oz (51.2 kg)     Physical Exam Pt is alert and oriented PERRL  EOMI HEART IS RRR no murmur or rub LCTA no wheezing or rales MUSCULOSKELETAL reveals some paraspinous muscle tenderness but no overt trigger points.  She does have a positive straight leg raise on the left side.  Labs  No results found for: HGBA1C Lab Results  Component Value Date   CREATININE 0.83 05/29/2018    -------------------------------------------------------------------------------------------------------------------- Lab Results  Component Value Date   WBC 7.6 05/29/2018   HGB 12.9 05/29/2018   HCT 40.9 05/29/2018   PLT 422 05/29/2018   GLUCOSE 51 (L) 05/29/2018   ALT 13 05/29/2018   AST 13 05/29/2018   NA 148 (H) 05/29/2018   K 4.9 05/29/2018   CL 108 (H) 05/29/2018   CREATININE 0.83 05/29/2018   BUN 14 05/29/2018   CO2 25 05/29/2018   TSH 0.538 05/29/2018   INR 0.91 03/28/2016    --------------------------------------------------------------------------------------------------------------------- Dg C-arm 1-60 Min-no Report  Result Date: 07/08/2018 Fluoroscopy was utilized by the requesting physician.  No radiographic interpretation.     Assessment & Plan:   [place] was seen today for procedure.  Diagnoses and all orders for this visit:  Intercostal neuralgia  Acute postthoracotomy pain  Chronic left-sided thoracic back pain  DDD (degenerative disc disease), lumbar -     Lumbar Epidural Injection  Sciatica of left side -     Lumbar Epidural Injection  Other orders -     traMADol (ULTRAM) 50 MG tablet; Take 1 tablet (50 mg total) by mouth 2 (two) times daily. -     triamcinolone acetonide (KENALOG-40) injection 40 mg -     sodium chloride flush (NS) 0.9 % injection 10 mL -     ropivacaine (PF) 2 mg/mL (0.2%) (NAROPIN) injection 10 mL -     midazolam (VERSED) 5 MG/5ML injection 5 mg -     lidocaine (PF) (XYLOCAINE) 1 % injection 5 mL -     lactated ringers infusion 1,000 mL -     iopamidol (ISOVUE-M) 41 % intrathecal injection 20  mL        ----------------------------------------------------------------------------------------------------------------------  Problem List Items Addressed This Visit    None    Visit Diagnoses    Intercostal neuralgia    -  Primary   Acute postthoracotomy pain       Chronic left-sided thoracic back pain       Relevant Medications   traMADol (ULTRAM) 50 MG tablet   triamcinolone acetonide (KENALOG-40) injection 40 mg (Completed)   ropivacaine (PF) 2 mg/mL (0.2%) (NAROPIN) injection 10 mL (Completed)   lidocaine (PF) (XYLOCAINE) 1 % injection 5 mL (Completed)   DDD (degenerative disc disease), lumbar       Relevant Medications   traMADol (ULTRAM) 50  MG tablet   triamcinolone acetonide (KENALOG-40) injection 40 mg (Completed)   Sciatica of left side       Relevant Medications   midazolam (VERSED) 5 MG/5ML injection 5 mg (Completed)        ----------------------------------------------------------------------------------------------------------------------  1. Intercostal neuralgia We will defer on any repeat injections for the chest pain.  This is been stable.  I want her to continue with her physical therapy exercises.  We will continue with her current pain regimen as well  2. Acute postthoracotomy pain As above and ultimately we may look at a repeat intercostal nerve block as these have been beneficial to keep the pain severity under control.  3. Chronic left-sided thoracic back pain As above and continue follow-up with her primary care team and Dr. Genevive Bi  4. DDD (degenerative disc disease), lumbar We will proceed with a repeat epidural injection today.  The risks and benefits of been reviewed in full detail and all questions answered.  Continue with core stretching strengthening exercises as well - Lumbar Epidural Injection  5. Sciatica of left side As above - Lumbar Epidural  Injection    ----------------------------------------------------------------------------------------------------------------------  I have changed [place] Malone's traMADol. I am also having her maintain her ondansetron, ranitidine, omeprazole, lactulose, polyethylene glycol powder, umeclidinium-vilanterol, albuterol, cyclobenzaprine, lidocaine, linaclotide, ALPRAZolam, azithromycin, benzonatate, fluticasone, guaiFENesin-codeine, Plecanatide, sucralfate, and omeprazole. We administered triamcinolone acetonide, sodium chloride flush, ropivacaine (PF) 2 mg/mL (0.2%), midazolam, lidocaine (PF), lactated ringers, and iopamidol.   Meds ordered this encounter  Medications  . traMADol (ULTRAM) 50 MG tablet    Sig: Take 1 tablet (50 mg total) by mouth 2 (two) times daily.    Dispense:  60 tablet    Refill:  2  . triamcinolone acetonide (KENALOG-40) injection 40 mg  . sodium chloride flush (NS) 0.9 % injection 10 mL  . ropivacaine (PF) 2 mg/mL (0.2%) (NAROPIN) injection 10 mL  . midazolam (VERSED) 5 MG/5ML injection 5 mg  . lidocaine (PF) (XYLOCAINE) 1 % injection 5 mL  . lactated ringers infusion 1,000 mL  . iopamidol (ISOVUE-M) 41 % intrathecal injection 20 mL   Patient's Medications  New Prescriptions   No medications on file  Previous Medications   ALBUTEROL (PROVENTIL HFA;VENTOLIN HFA) 108 (90 BASE) MCG/ACT INHALER    Inhale 1-2 puffs into the lungs every 4 (four) hours as needed for wheezing or shortness of breath.   ALPRAZOLAM (XANAX) 0.5 MG TABLET    Take 1 tablet (0.5 mg total) by mouth 3 (three) times daily as needed for sleep or anxiety.   AZITHROMYCIN (ZITHROMAX) 250 MG TABLET       BENZONATATE (TESSALON) 200 MG CAPSULE       CYCLOBENZAPRINE (FLEXERIL) 5 MG TABLET    Take 1-2 tablets 3 times daily as needed   FLUTICASONE (FLONASE) 50 MCG/ACT NASAL SPRAY    Place into the nose.   GUAIFENESIN-CODEINE 100-10 MG/5ML SYRUP    Take by mouth.   LACTULOSE (CEPHULAC) 20 G PACKET     Take 1 packet (20 g total) by mouth 3 (three) times daily.   LIDOCAINE (LIDODERM) 5 %    Place 1 patch onto the skin every 12 (twelve) hours. Remove & Discard patch within 12 hours or as directed by MD   LINACLOTIDE (LINZESS) 72 MCG CAPSULE    Take by mouth.   OMEPRAZOLE (PRILOSEC) 40 MG CAPSULE    Take 1 capsule (40 mg total) by mouth daily for 90 doses.   OMEPRAZOLE (PRILOSEC) 40 MG CAPSULE  Take 1 capsule (40 mg total) by mouth daily.   ONDANSETRON (ZOFRAN) 4 MG TABLET    Take 4 mg by mouth as needed.    PLECANATIDE (TRULANCE) 3 MG TABS    Take 3 mg by mouth daily.   POLYETHYLENE GLYCOL POWDER (GLYCOLAX/MIRALAX) POWDER    Take 255 g by mouth daily.   RANITIDINE (ZANTAC) 150 MG TABLET    Take 150 mg by mouth daily.    SUCRALFATE (CARAFATE) 1 G TABLET    Take 1 tablet (1 g total) by mouth 4 (four) times daily.   UMECLIDINIUM-VILANTEROL (ANORO ELLIPTA) 62.5-25 MCG/INH AEPB    Inhale 1 puff into the lungs daily.  Modified Medications   Modified Medication Previous Medication   TRAMADOL (ULTRAM) 50 MG TABLET traMADol (ULTRAM) 50 MG tablet      Take 1 tablet (50 mg total) by mouth 2 (two) times daily.    Take 50 mg by mouth every 6 (six) hours as needed for moderate pain.  Discontinued Medications   No medications on file   ----------------------------------------------------------------------------------------------------------------------  Follow-up: Return in about 2 months (around 09/08/2018) for procedure, evaluation.   Procedure: 5 S1 LESI with fluoroscopic guidance and positive moderate sedation  NOTE: The risks, benefits, and expectations of the procedure have been discussed and explained to the patient who was understanding and in agreement with suggested treatment plan. No guarantees were made.  DESCRIPTION OF PROCEDURE: Lumbar epidural steroid injection with 2 IV Versed, EKG, blood pressure, pulse, and pulse oximetry monitoring. The procedure was performed with the patient in the  prone position under fluoroscopic guidance.  Sterile prep x3 was initiated and I then injected subcutaneous lidocaine to the overlying L5-S1 site after its fluoroscopic identifictation.  Using strict aseptic technique, I then advanced an 18-gauge Tuohy epidural needle in the midline using interlaminar approach via loss-of-resistance to saline technique. There was negative aspiration for heme or  CSF.  I then confirmed position with both AP and Lateral fluoroscan.  2 cc of Isovue were injected and a  total of 5 mL of Preservative-Free normal saline mixed with 40 mg of Kenalog and 1cc Ropicaine 0.2 percent were injected incrementally via the  epidurally placed needle. The needle was removed. The patient tolerated the injection well and was convalesced and discharged to home in stable condition. Should the patient have any post procedure difficulty they have been instructed on how to contact us for assistance.    Molli Barrows, MD

## 2018-07-11 ENCOUNTER — Telehealth: Payer: Self-pay

## 2018-07-11 NOTE — Telephone Encounter (Signed)
Spoke with pt and informed her that we have rescheduled her CT of the abdomen. Pt is aware of appointment date, time, location, and prior prep.

## 2018-07-17 ENCOUNTER — Telehealth: Payer: Self-pay | Admitting: Gastroenterology

## 2018-07-17 NOTE — Telephone Encounter (Signed)
Tillie Rung from [facility] left vm regarding authorization on pt CT scan 07/19/18 please call [phone]

## 2018-07-18 ENCOUNTER — Ambulatory Visit (INDEPENDENT_AMBULATORY_CARE_PROVIDER_SITE_OTHER): Payer: BLUE CROSS/BLUE SHIELD | Admitting: Gastroenterology

## 2018-07-18 ENCOUNTER — Encounter: Payer: Self-pay | Admitting: Gastroenterology

## 2018-07-18 VITALS — BP 111/75 | HR 82 | Ht 65.0 in | Wt 111.8 lb

## 2018-07-18 DIAGNOSIS — K581 Irritable bowel syndrome with constipation: Secondary | ICD-10-CM | POA: Diagnosis not present

## 2018-07-18 DIAGNOSIS — R1013 Epigastric pain: Secondary | ICD-10-CM

## 2018-07-18 NOTE — Progress Notes (Signed)
[name] MD, MRCP(U.K) [address]  [place]  [place], [place] [address]  Main: [phone]  Fax: [phone]   Primary Care Physician: Letta Median, MD  Primary Gastroenterologist:  Dr. [name]   No chief complaint on file.   HPI: Sheila Suarez is a 62 y.o. female  She is here to see me for abdominal pain.  Summary of history : She has a history of abdominal pain ongoing for many years, upper abdomen, severe constipation and relief after a bowel movement . She has been on narcotics. She had a normal colonoscopy on 06/02/2016 and an EGD which appeared normal but biopsies taken showed active chronic gastritis with H pylori .triple therapy prescribed on 06/16/16 .completed treatment for H pylori.Eradication confirmed subsequently.She is a S/p left thoracotomy and LUL lobectomy .LFT's normal in 05/2016 . PET scan in 01/2016 showed only some mild hypermetabolism in the stomach and rest of GI tract was negative.On trulance commenced at last office visit     Interval history 05/29/18 -07/18/18   EGD 05/31/18 :Gastritis  -bx showed reactive gastropathy.  On linzess works when she takes it . Has a bowel movenent daily with linzess.  She says she cant eat due to poor apetite.   Scheduled for a CT scan tomorrow. Due to see Dr Andree Elk.  Stopped all NSAID's.   IB guard has not been tried. Taking Prilosec/ Stopped smoking .  CBC,CMP- normal in 04/2018 .   BP 103/60   Pulse 60   Ht 5\' 5"  (1.651 m)   Wt 112 lb 12.8 oz (51.2 kg)   BMI 18.77 kg/m    3   There were no vitals taken for this visit.   Current Outpatient Medications  Medication Sig Dispense Refill  . albuterol (PROVENTIL HFA;VENTOLIN HFA) 108 (90 Base) MCG/ACT inhaler Inhale 1-2 puffs into the lungs every 4 (four) hours as needed for wheezing or shortness of breath. 1 Inhaler 12  . ALPRAZolam (XANAX) 0.5 MG tablet Take 1 tablet (0.5 mg total) by mouth 3 (three) times daily as needed for  sleep or anxiety. 30 tablet 0  . azithromycin (ZITHROMAX) 250 MG tablet   0  . benzonatate (TESSALON) 200 MG capsule   0  . cyclobenzaprine (FLEXERIL) 5 MG tablet Take 1-2 tablets 3 times daily as needed 20 tablet 0  . fluticasone (FLONASE) 50 MCG/ACT nasal spray Place into the nose.    Marland Kitchen guaiFENesin-codeine 100-10 MG/5ML syrup Take by mouth.    . lactulose (CEPHULAC) 20 g packet Take 1 packet (20 g total) by mouth 3 (three) times daily. 30 each 0  . lidocaine (LIDODERM) 5 % Place 1 patch onto the skin every 12 (twelve) hours. Remove & Discard patch within 12 hours or as directed by MD 10 patch 0  . linaclotide (LINZESS) 72 MCG capsule Take by mouth.    Marland Kitchen omeprazole (PRILOSEC) 40 MG capsule Take 1 capsule (40 mg total) by mouth daily for 90 doses. 90 capsule 0  . omeprazole (PRILOSEC) 40 MG capsule Take 1 capsule (40 mg total) by mouth daily. 90 capsule 3  . ondansetron (ZOFRAN) 4 MG tablet Take 4 mg by mouth as needed.     Marland Kitchen Plecanatide (TRULANCE) 3 MG TABS Take 3 mg by mouth daily. 90 tablet 1  . polyethylene glycol powder (GLYCOLAX/MIRALAX) powder Take 255 g by mouth daily. (Patient not taking: Reported on 05/31/2018) 255 g 3  . ranitidine (ZANTAC) 150 MG tablet Take 150 mg by mouth  daily.     . sucralfate (CARAFATE) 1 g tablet Take 1 tablet (1 g total) by mouth 4 (four) times daily. 244 tablet 0  . traMADol (ULTRAM) 50 MG tablet Take 1 tablet (50 mg total) by mouth 2 (two) times daily. 60 tablet 2  . umeclidinium-vilanterol (ANORO ELLIPTA) 62.5-25 MCG/INH AEPB Inhale 1 puff into the lungs daily. 1 each 12   No current facility-administered medications for this visit.     Allergies as of 07/18/2018  . (No Known Allergies)    ROS:  General: Negative for anorexia, weight loss, fever, chills, fatigue, weakness. ENT: Negative for hoarseness, difficulty swallowing , nasal congestion. CV: Negative for chest pain, angina, palpitations, dyspnea on exertion, peripheral edema.  Respiratory:  Negative for dyspnea at rest, dyspnea on exertion, cough, sputum, wheezing.  GI: See history of present illness. GU:  Negative for dysuria, hematuria, urinary incontinence, urinary frequency, nocturnal urination.  Endo: Negative for unusual weight change.    Physical Examination:   There were no vitals taken for this visit.  General: Well-nourished, well-developed in no acute distress.  Eyes: No icterus. Conjunctivae pink. Mouth: Oropharyngeal mucosa moist and pink , no lesions erythema or exudate. Lungs: Clear to auscultation bilaterally. Non-labored. Heart: Regular rate and rhythm, no murmurs rubs or gallops.  Abdomen: Bowel sounds are normal, nontender, nondistended, no hepatosplenomegaly or masses, no abdominal bruits or hernia , no rebound or guarding.   Extremities: No lower extremity edema. No clubbing or deformities. Neuro: Alert and oriented x 3.  Grossly intact. Skin: Warm and dry, no jaundice.   Psych: Alert and cooperative, normal mood and affect.   Imaging Studies: Dg Abd Acute W/chest  Result Date: 06/24/2018 CLINICAL DATA:  Epigastric abdominal pain for 3 months. EXAM: DG ABDOMEN ACUTE W/ 1V CHEST COMPARISON:  None. FINDINGS: There is no evidence of dilated bowel loops or free intraperitoneal air. Probable calcified degenerating fibroid is noted in the pelvis. Heart size and mediastinal contours are within normal limits. Both lungs are clear. IMPRESSION: No evidence of bowel obstruction or ileus. No acute cardiopulmonary disease. Electronically Signed   By: Marijo Conception, M.D.   On: 06/24/2018 13:54   Dg C-arm 1-60 Min-no Report  Result Date: 07/08/2018 Fluoroscopy was utilized by the requesting physician.  No radiographic interpretation.    Assessment and Plan:   Sheila Suarez is a 62 y.o. y/o female here for follow up for IBS-C and GERD. Likelu element of functional dyspepsia. CT abdomen scheduled tomorrow.   1.Abdominal pain continue PPI, limit narcotics  , take linzess daily. Trial of IB guard.  Start ensure BID.   2. Irritable bowel syndrome with constipation     Dr [name]  MD,MRCP [facility]) Follow up in  3 months.

## 2018-07-18 NOTE — Telephone Encounter (Signed)
Prior authorization has been submitted. Went to prior review.   You may want to reschedule this CT scan. I don't think they will make a decision by today at 2:00pm.

## 2018-07-19 ENCOUNTER — Ambulatory Visit: Admission: RE | Admit: 2018-07-19 | Payer: BLUE CROSS/BLUE SHIELD | Source: Ambulatory Visit

## 2018-08-02 ENCOUNTER — Ambulatory Visit
Admission: RE | Admit: 2018-08-02 | Discharge: 2018-08-02 | Disposition: A | Discharge status: Home / Self Care | Payer: Medicare Other | Source: Ambulatory Visit | Attending: Gastroenterology | Admitting: Gastroenterology

## 2018-08-02 DIAGNOSIS — R1013 Epigastric pain: Secondary | ICD-10-CM

## 2018-08-02 DIAGNOSIS — K769 Liver disease, unspecified: Secondary | ICD-10-CM | POA: Diagnosis not present

## 2018-08-02 DIAGNOSIS — K5900 Constipation, unspecified: Secondary | ICD-10-CM | POA: Diagnosis not present

## 2018-08-02 LAB — POCT I-STAT CREATININE: CREATININE: 1 mg/dL (ref 0.44–1.00)

## 2018-08-02 MED ORDER — IOPAMIDOL (ISOVUE-300) INJECTION 61%
75.0000 mL | Freq: Once | INTRAVENOUS | Status: AC | PRN
  Administered 2018-08-02: 75 mL via INTRAVENOUS

## 2018-08-04 ENCOUNTER — Encounter: Payer: Self-pay | Admitting: Gastroenterology

## 2018-08-07 ENCOUNTER — Telehealth: Payer: Self-pay

## 2018-08-07 NOTE — Telephone Encounter (Signed)
Pt has been notified of CT results.

## 2018-08-07 NOTE — Telephone Encounter (Signed)
-----   Message from [name], MD sent at 08/04/2018  5:27 PM EST ----- Inform no acute findings

## 2018-12-17 ENCOUNTER — Telehealth: Payer: Self-pay | Admitting: Internal Medicine

## 2018-12-17 DIAGNOSIS — R05 Cough: Secondary | ICD-10-CM

## 2018-12-17 DIAGNOSIS — R059 Cough, unspecified: Secondary | ICD-10-CM

## 2018-12-17 NOTE — Telephone Encounter (Signed)
Please proceed with OV  Can we please obtain CT chest without contrast DX cough, hx of lung cancer Obtain 1 week prior to scheduled appointment

## 2018-12-17 NOTE — Telephone Encounter (Signed)
Called and spoke to pt, who reports of new onset cough that developed 2-28mo ago.  Pt stated cough started after her brother was painting in her house.  Denied fever, chills, sweats, recent travel or sick contacts.  Sob is baseline per pt.  Pt is requesting an appointment, to ensure that the cancer has not returned.  She declined virtual or phone visit, as she wants DK to listen to her lungs.  Pt has been screened, as listed below.   DK please advise if okay to schedule office visit. Thanks  Called patient for COVID-19 pre-screening for in office visit.  Have you recently traveled any where out of the local area in the last 2 weeks? no  Have you been in close contact with a person diagnosed with COVID-19 within the last 2 weeks? no  Do you currently have any of the following symptoms? If so, when did they start? Cough-2-72mo ago    Diarrhea   Joint Pain Fever      Muscle Pain   Red eyes Shortness of breath-baseline   Abdominal pain  Vomiting Loss of smell    Rash    Sore Throat Headache    Weakness   Bruising or bleeding- normal per pt   Okay to proceed with visit. (date)  / Needs to reschedule visit. (date)

## 2018-12-17 NOTE — Telephone Encounter (Signed)
Pt is aware of below recommendations and voiced her understanding.  CT has been ordered.  Pt has been scheduled for appt 12/26/2018. Nothing further is needed at this time.

## 2018-12-18 ENCOUNTER — Ambulatory Visit: Payer: Medicare Other | Attending: Anesthesiology | Admitting: Anesthesiology

## 2018-12-18 ENCOUNTER — Other Ambulatory Visit: Payer: Self-pay

## 2018-12-18 ENCOUNTER — Encounter: Payer: Self-pay | Admitting: Anesthesiology

## 2018-12-18 ENCOUNTER — Telehealth: Payer: Self-pay | Admitting: *Deleted

## 2018-12-18 DIAGNOSIS — G8912 Acute post-thoracotomy pain: Secondary | ICD-10-CM

## 2018-12-18 DIAGNOSIS — M5136 Other intervertebral disc degeneration, lumbar region: Secondary | ICD-10-CM

## 2018-12-18 DIAGNOSIS — M5432 Sciatica, left side: Secondary | ICD-10-CM | POA: Diagnosis not present

## 2018-12-18 DIAGNOSIS — G588 Other specified mononeuropathies: Secondary | ICD-10-CM | POA: Diagnosis not present

## 2018-12-18 DIAGNOSIS — G8929 Other chronic pain: Secondary | ICD-10-CM | POA: Diagnosis not present

## 2018-12-18 DIAGNOSIS — M546 Pain in thoracic spine: Secondary | ICD-10-CM | POA: Diagnosis not present

## 2018-12-18 MED ORDER — HYDROCODONE-ACETAMINOPHEN 5-325 MG PO TABS: 1.0000 | ORAL_TABLET | Freq: Four times a day (QID) | ORAL | 0 refills | Status: DC | PRN

## 2018-12-18 NOTE — Progress Notes (Signed)
Virtual Visit via Telephone Note  I connected with Sheila Suarez on 12/18/18 at 11:45 AM EDT by telephone and verified that I am speaking with the correct person using two identifiers.  Location: Patient: Home Provider: Pain control center   I discussed the limitations, risks, security and privacy concerns of performing an evaluation and management service by telephone and the availability of in person appointments. I also discussed with the patient that there may be a patient responsible charge related to this service. The patient expressed understanding and agreed to proceed.   History of Present Illness: I spoke with Sheila Suarez over the phone today via telephone conversation.  Unfortunately she has a lot of problems with her low back over the last month.  The nature of the back pain is similar to what she experienced back in December.  She had an epidural steroid injection for the lumbar region at that time which eliminated 75 to 80% of her pain for about a month.  In the following month she had some mild recurrence but not as severe but over the past month she has had more intense low back pain and left leg pain.  In the past she is been on anti-inflammatory medications and tramadol with limited success.  The stretching does not seem to help much either.  Physical on therapy exercises have been of limited benefit.  She desires to proceed with a repeat epidural at the soonest possible appointment but this is been limited secondary to the COVID crisis.  Otherwise she is in her usual state of health.    Observations/Objective:  Current Outpatient Medications:  .  albuterol (PROVENTIL HFA;VENTOLIN HFA) 108 (90 Base) MCG/ACT inhaler, Inhale 1-2 puffs into the lungs every 4 (four) hours as needed for wheezing or shortness of breath., Disp: 1 Inhaler, Rfl: 12 .  ALPRAZolam (XANAX) 0.5 MG tablet, Take 1 tablet (0.5 mg total) by mouth 3 (three) times daily as needed for sleep or anxiety., Disp: 30  tablet, Rfl: 0 .  azithromycin (ZITHROMAX) 250 MG tablet, , Disp: , Rfl: 0 .  benzonatate (TESSALON) 200 MG capsule, , Disp: , Rfl: 0 .  cyclobenzaprine (FLEXERIL) 5 MG tablet, Take 1-2 tablets 3 times daily as needed, Disp: 20 tablet, Rfl: 0 .  fluticasone (FLONASE) 50 MCG/ACT nasal spray, Place into the nose., Disp: , Rfl:  .  guaiFENesin-codeine 100-10 MG/5ML syrup, Take by mouth., Disp: , Rfl:  .  HYDROcodone-acetaminophen (NORCO/VICODIN) 5-325 MG tablet, Take 1 tablet by mouth every 6 (six) hours as needed for up to 30 days for moderate pain or severe pain., Disp: 45 tablet, Rfl: 0 .  lactulose (CEPHULAC) 20 g packet, Take 1 packet (20 g total) by mouth 3 (three) times daily. (Patient not taking: Reported on 07/18/2018), Disp: 30 each, Rfl: 0 .  lidocaine (LIDODERM) 5 %, Place 1 patch onto the skin every 12 (twelve) hours. Remove & Discard patch within 12 hours or as directed by MD, Disp: 10 patch, Rfl: 0 .  linaclotide (LINZESS) 72 MCG capsule, Take by mouth., Disp: , Rfl:  .  omeprazole (PRILOSEC) 40 MG capsule, Take 1 capsule (40 mg total) by mouth daily for 90 doses., Disp: 90 capsule, Rfl: 0 .  omeprazole (PRILOSEC) 40 MG capsule, Take 1 capsule (40 mg total) by mouth daily., Disp: 90 capsule, Rfl: 3 .  ondansetron (ZOFRAN) 4 MG tablet, Take 4 mg by mouth as needed. , Disp: , Rfl:  .  polyethylene glycol powder (GLYCOLAX/MIRALAX) powder, Take 255 g  by mouth daily. (Patient not taking: Reported on 05/31/2018), Disp: 255 g, Rfl: 3 .  ranitidine (ZANTAC) 150 MG tablet, Take 150 mg by mouth daily. , Disp: , Rfl:  .  sucralfate (CARAFATE) 1 g tablet, Take 1 tablet (1 g total) by mouth 4 (four) times daily., Disp: 244 tablet, Rfl: 0 .  umeclidinium-vilanterol (ANORO ELLIPTA) 62.5-25 MCG/INH AEPB, Inhale 1 puff into the lungs daily., Disp: 1 each, Rfl: 12   Assessment and Plan: 1. Intercostal neuralgia   2. Acute postthoracotomy pain   3. Chronic left-sided thoracic back pain   4. DDD  (degenerative disc disease), lumbar   5. Sciatica of left side   Based on our discussion today I think it would be appropriate to schedule her for repeat epidural injection at the next available appointment date.  Hopefully will be able to do this in the pain control center soon.  In the meantime I am going to start her on hydrocodone 5 mg tablets to be taken twice a day as needed on a twice daily dosing schedule.  This will be forwarded to her pharmacy today.  We have gone over the risks and benefits of opioid management.  She is to continue with stretching strengthening exercises and continue follow-up with her primary care physicians.  I have reviewed the [facility] practitioner database information and it is appropriate.   Follow Up Instructions: Follow-up within 3 to 4 weeks    I discussed the assessment and treatment plan with the patient. The patient was provided an opportunity to ask questions and all were answered. The patient agreed with the plan and demonstrated an understanding of the instructions.   The patient was advised to call back or seek an in-person evaluation if the symptoms worsen or if the condition fails to improve as anticipated.  I provided 30 minutes of non-face-to-face time during this encounter.   Molli Barrows, MD

## 2018-12-24 ENCOUNTER — Ambulatory Visit: Payer: Medicare Other

## 2018-12-24 ENCOUNTER — Ambulatory Visit
Admission: RE | Admit: 2018-12-24 | Discharge: 2018-12-24 | Disposition: A | Discharge status: Home / Self Care | Payer: Medicare Other | Source: Ambulatory Visit | Attending: Internal Medicine | Admitting: Internal Medicine

## 2018-12-24 ENCOUNTER — Other Ambulatory Visit: Payer: Self-pay

## 2018-12-24 DIAGNOSIS — R05 Cough: Secondary | ICD-10-CM | POA: Diagnosis not present

## 2018-12-24 DIAGNOSIS — R059 Cough, unspecified: Secondary | ICD-10-CM

## 2018-12-24 DIAGNOSIS — Z85118 Personal history of other malignant neoplasm of bronchus and lung: Secondary | ICD-10-CM | POA: Diagnosis not present

## 2018-12-26 ENCOUNTER — Ambulatory Visit (INDEPENDENT_AMBULATORY_CARE_PROVIDER_SITE_OTHER): Payer: Medicare Other | Admitting: Internal Medicine

## 2018-12-26 ENCOUNTER — Encounter: Payer: Self-pay | Admitting: Internal Medicine

## 2018-12-26 ENCOUNTER — Other Ambulatory Visit: Payer: Self-pay

## 2018-12-26 DIAGNOSIS — J441 Chronic obstructive pulmonary disease with (acute) exacerbation: Secondary | ICD-10-CM | POA: Diagnosis not present

## 2018-12-26 MED ORDER — PREDNISONE 20 MG PO TABS: 20.0000 mg | ORAL_TABLET | Freq: Every day | ORAL | 0 refills | Status: DC

## 2018-12-26 NOTE — Patient Instructions (Signed)
Prednisone 20 mg daily for 10 days Continue inhalers as prescribed

## 2018-12-26 NOTE — Progress Notes (Signed)
[facility] Consultation      Date: 12/26/2018,   MRN# [id number] Sheila Suarez 11-14-1955     Admission                  Current  Sheila Suarez is a 63 y.o. old female seen in consultation for SOB at the request of Dr. Genevive Bi   SYNOPSIS 25 AAF with chronic tobacco abuse Quit smoking 03/2016 Had dx of LUL nodule since 2012 Underwent LUL lung resection by Dr Genevive Bi 03/2016 as requested by oncology Benign findings per path reports  I have advised her to call her Cardiologist PFT ratio [id number]%-[id number] 435predicted mild obstruction Moderate restriction-NL DLCO when corrected for volumes Her ONO and 6MWT WNL no evidence of hypoxia       TELEPHONE VISIT    In the setting of the current Covid19 crisis, you are scheduled for a  visit with me on 12/26/2018  Just as we do with many in-office visits, in order for you to participate in this visit, we must obtain consent.   I can obtain your verbal consent now.  PATIENT AGREES AND CONFIRMS -YES This Visit has Audio and Visual Capabilities for optimal patient care experience   Evaluation Performed:  Follow-up visit  This visit type was conducted due to national recommendations for restrictions regarding the COVID-19 Pandemic (e.g. social distancing).  This format is felt to be most appropriate for this patient at this time.  All issues noted in this document were discussed and addressed.     Virtual Visit via Telephone Note     I connected with patient on 12/26/2018 by telephone and verified that I am speaking with the correct person using two identifiers.   I discussed the limitations, risks, security and privacy concerns of performing an evaluation and management service by telephone and the availability of in person appointments. I also discussed with the patient that there may be a patient responsible charge related to this service. The patient expressed understanding and agreed to proceed.   Location of the  patient: Home Location of provider: office Participating persons: Patient and provider only   CHIEF COMPLAINT:   Follow up SOB and DOE Follow up lung resection benign   HISTORY OF PRESENT ILLNESS   +wheezing +productive cough +SOB and DOE stable No fevers No chills  Doing OK with ANORO Albuterol as needed   PFT PFT   Current Medication:  Current Outpatient Medications:  .  albuterol (PROVENTIL HFA;VENTOLIN HFA) 108 (90 Base) MCG/ACT inhaler, Inhale 1-2 puffs into the lungs every 4 (four) hours as needed for wheezing or shortness of breath., Disp: 1 Inhaler, Rfl: 12 .  ALPRAZolam (XANAX) 0.5 MG tablet, Take 1 tablet (0.5 mg total) by mouth 3 (three) times daily as needed for sleep or anxiety., Disp: 30 tablet, Rfl: 0 .  azithromycin (ZITHROMAX) 250 MG tablet, , Disp: , Rfl: 0 .  benzonatate (TESSALON) 200 MG capsule, , Disp: , Rfl: 0 .  cyclobenzaprine (FLEXERIL) 5 MG tablet, Take 1-2 tablets 3 times daily as needed, Disp: 20 tablet, Rfl: 0 .  fluticasone (FLONASE) 50 MCG/ACT nasal spray, Place into the nose., Disp: , Rfl:  .  guaiFENesin-codeine 100-10 MG/5ML syrup, Take by mouth., Disp: , Rfl:  .  HYDROcodone-acetaminophen (NORCO/VICODIN) 5-325 MG tablet, Take 1 tablet by mouth every 6 (six) hours as needed for up to 30 days for moderate pain or severe pain., Disp: 45 tablet, Rfl: 0 .  lactulose (CEPHULAC) 20 g packet, Take  1 packet (20 g total) by mouth 3 (three) times daily. (Patient not taking: Reported on 07/18/2018), Disp: 30 each, Rfl: 0 .  lidocaine (LIDODERM) 5 %, Place 1 patch onto the skin every 12 (twelve) hours. Remove & Discard patch within 12 hours or as directed by MD, Disp: 10 patch, Rfl: 0 .  linaclotide (LINZESS) 72 MCG capsule, Take by mouth., Disp: , Rfl:  .  omeprazole (PRILOSEC) 40 MG capsule, Take 1 capsule (40 mg total) by mouth daily for 90 doses., Disp: 90 capsule, Rfl: 0 .  omeprazole (PRILOSEC) 40 MG capsule, Take 1 capsule (40 mg total) by mouth  daily., Disp: 90 capsule, Rfl: 3 .  ondansetron (ZOFRAN) 4 MG tablet, Take 4 mg by mouth as needed. , Disp: , Rfl:  .  polyethylene glycol powder (GLYCOLAX/MIRALAX) powder, Take 255 g by mouth daily. (Patient not taking: Reported on 05/31/2018), Disp: 255 g, Rfl: 3 .  ranitidine (ZANTAC) 150 MG tablet, Take 150 mg by mouth daily. , Disp: , Rfl:  .  sucralfate (CARAFATE) 1 g tablet, Take 1 tablet (1 g total) by mouth 4 (four) times daily., Disp: 244 tablet, Rfl: 0 .  umeclidinium-vilanterol (ANORO ELLIPTA) 62.5-25 MCG/INH AEPB, Inhale 1 puff into the lungs daily., Disp: 1 each, Rfl: 12    ALLERGIES   Patient has no known allergies.     REVIEW OF SYSTEMS   Review of Systems  Constitutional: Positive for malaise/fatigue. Negative for chills, diaphoresis, fever and weight loss.  HENT: Negative for congestion and hearing loss.   Eyes: Negative for blurred vision and double vision.  Respiratory: Negative for cough, hemoptysis, sputum production, shortness of breath and wheezing.   Cardiovascular: Positive for chest pain. Negative for palpitations, orthopnea and leg swelling.  Gastrointestinal: Negative for abdominal pain, heartburn, nausea and vomiting.  Genitourinary: Negative for dysuria and urgency.  Musculoskeletal: Negative for back pain, myalgias and neck pain.  Skin: Negative for rash.  Neurological: Negative for dizziness, tingling, tremors, weakness and headaches.  Endo/Heme/Allergies: Does not bruise/bleed easily.  Psychiatric/Behavioral: The patient is nervous/anxious.   All other systems reviewed and are negative.   There were no vitals taken for this visit.    PHYSICAL EXAM  Physical Exam  Constitutional: She is oriented to person, place, and time. She appears well-developed and well-nourished. No distress.  HENT:  Head: Normocephalic and atraumatic.  Mouth/Throat: No oropharyngeal exudate.  Eyes: Pupils are equal, round, and reactive to light. EOM are normal. No  scleral icterus.  Neck: Normal range of motion. Neck supple.  Cardiovascular: Normal rate, regular rhythm and normal heart sounds.  No murmur heard. Pulmonary/Chest: No stridor. No respiratory distress. She has no wheezes.  Left chest wall pain with palpation  Abdominal: Soft. Bowel sounds are normal.  Musculoskeletal: Normal range of motion.        General: No edema.  Neurological: She is alert and oriented to person, place, and time. No cranial nerve deficit.  Skin: Skin is warm. She is not diaphoretic.  Psychiatric: She has a normal mood and affect.      IMAGING  CT chest 10/2016 I have Independently reviewed images of  CT chest    Interpretation:no acute findings, no effusions, no massess  PFT 08/29/16  Mod restrictive Lung disease FVC 63% predicted 2L FEF 25/75 53% predicted  Mixed mild obstructive small airways disease  CT chest 11/2018  .kkindThe CT chest was Independently Reviewed By Me Today reviewed-5MM nodule unchanged in 2 years    ASSESSMENT/PLAN  63 year old pleasant thin African-American female for follow-up shortness of breath with exertion with history of chronic tobacco abuse with history of left upper lobe nodule status post left upper lobe lung resection with benign findings with evidence of intermittent reactive airways disease with small airways obstruction COPD and left chest wall pain/ persistent chest wall pain  Chronic shortness of breath and dyspnea exertion does not correlate with CT findings or PFT findings in the setting of normal ONO and 6-minute walk test Gold stage a COPD Continue inhalers as prescribed with ANORO  Chest wall pain Follow-up with Dr. Genevive Bi if needed Follow-up with pain clinic with Dr. Andree Elk  MILD COPD EXACERBATION Prednisone 20 mgh daily for 10 days    COVID-19 EDUCATION: The signs and symptoms of COVID-19 were discussed with the patient and how to seek care for testing.  The importance of social distancing was discussed  today. Hand Washing Techniques and avoid touching face was advised.  MEDICATION ADJUSTMENTS/LABS AND TESTS ORDERED:  CURRENT MEDICATIONS REVIEWED AT LENGTH WITH PATIENT TODAY   Patient satisfied with Plan of action and management. All questions answered Follow up in 1 year   Melizza Kanode Patricia Pesa, M.D.  [facility]  Medical Director [place] Director [facility] Cardio-Pulmonary Department

## 2019-01-03 ENCOUNTER — Telehealth: Payer: Self-pay | Admitting: Internal Medicine

## 2019-01-03 MED ORDER — PREDNISONE 20 MG PO TABS: 20.0000 mg | ORAL_TABLET | Freq: Every day | ORAL | 0 refills | Status: DC

## 2019-01-03 NOTE — Telephone Encounter (Signed)
Refill for prednisone 20mg  #5 has been sent to preferred pharmacy. Pt is aware and voiced her understanding. Nothing further is needed.

## 2019-01-03 NOTE — Telephone Encounter (Signed)
Was able to get through 5 days, is still having some cough and congestion. Says the prednisone was helping to clear it up. She is requesting 5 more days refill.  DK please advise.

## 2019-01-03 NOTE — Telephone Encounter (Signed)
Please refill.

## 2019-01-03 NOTE — Telephone Encounter (Signed)
ATC, LM on VM for clarification. If she started it on 12/26/18, she should have finished 8 days.

## 2019-01-06 ENCOUNTER — Telehealth: Payer: Self-pay | Admitting: Anesthesiology

## 2019-01-06 NOTE — Telephone Encounter (Signed)
Spoke with patient and then pharmacy re; this issue.  Pharmacy states that there was no PA generated for this medication, Medicare simply said they could only fill 7 days.  I have sent Dr Andree Elk a message to send the remainder in to pharmacy.

## 2019-01-06 NOTE — Telephone Encounter (Signed)
Pt states that the pharmacy only gave her 7 days worth of her pain medication and that Dr Andree Elk will need to send over a new RX

## 2019-01-13 ENCOUNTER — Other Ambulatory Visit: Payer: Self-pay

## 2019-01-13 ENCOUNTER — Ambulatory Visit: Payer: Medicare Other | Attending: Anesthesiology | Admitting: Anesthesiology

## 2019-01-13 ENCOUNTER — Ambulatory Visit (INDEPENDENT_AMBULATORY_CARE_PROVIDER_SITE_OTHER): Payer: Medicare Other | Admitting: Gastroenterology

## 2019-01-13 ENCOUNTER — Telehealth: Payer: Self-pay

## 2019-01-13 DIAGNOSIS — K30 Functional dyspepsia: Secondary | ICD-10-CM

## 2019-01-13 DIAGNOSIS — M5136 Other intervertebral disc degeneration, lumbar region: Secondary | ICD-10-CM

## 2019-01-13 DIAGNOSIS — R634 Abnormal weight loss: Secondary | ICD-10-CM | POA: Diagnosis not present

## 2019-01-13 DIAGNOSIS — G8922 Chronic post-thoracotomy pain: Secondary | ICD-10-CM

## 2019-01-13 DIAGNOSIS — R0781 Pleurodynia: Secondary | ICD-10-CM

## 2019-01-13 DIAGNOSIS — Z79891 Long term (current) use of opiate analgesic: Secondary | ICD-10-CM

## 2019-01-13 DIAGNOSIS — R1013 Epigastric pain: Secondary | ICD-10-CM

## 2019-01-13 MED ORDER — RIFAXIMIN 550 MG PO TABS: 550.0000 mg | ORAL_TABLET | Freq: Two times a day (BID) | ORAL | 0 refills | Status: AC

## 2019-01-13 MED ORDER — HYDROCODONE-ACETAMINOPHEN 5-325 MG PO TABS: 1.0000 | ORAL_TABLET | Freq: Four times a day (QID) | ORAL | 0 refills | Status: AC | PRN

## 2019-01-13 NOTE — Telephone Encounter (Signed)
Spoke with pt and informed her of her gastric emptying appointment information.

## 2019-01-13 NOTE — Progress Notes (Signed)
Sheila Suarez , MD [address]  [place]  [place], [place] [address]  Main: [phone]  Fax: [phone]   Primary Care Physician: Letta Median, MD  Virtual Visit via Video Note  I connected with patient on 01/13/19 at 10:45 AM EDT by video and verified that I am speaking with the correct person using two identifiers.   I discussed the limitations, risks, security and privacy concerns of performing an evaluation and management service by video  and the availability of in person appointments. I also discussed with the patient that there may be a patient responsible charge related to this service. The patient expressed understanding and agreed to proceed.  Location of Patient: Home Location of Provider: Home Persons involved: Patient and provider only   History of Present Illness: Weight loiss  HPI: Sheila Suarez is a 63 y.o. female  Summary of history : She has a history of abdominal pain ongoing for many years, upper abdomen, severe constipation and relief after a bowel movement . She has been on narcotics. She had a normal colonoscopy on 06/02/2016 and an EGD which appeared normal but biopsies taken showed active chronic gastritis with H pylori .triple therapy prescribed on 06/16/16 .completed treatment for H pylori.Eradication confirmed subsequently.She is a S/p left thoracotomy and LUL lobectomy .LFT's normal in 05/2016 . PET scan in 01/2016 showed only some mild hypermetabolism in the stomach and rest of GI tract was negative.On trulance commenced at last office visit EGD 05/31/18 :Gastritis  -bx showed reactive gastropathy. CBC,CMP- normal in 04/2018 .    Interval history 07/18/18 -01/13/2019  Started as a video visit- could not see her ans she had difficulty connecting and converted to a tele visit.    CT chest in 11/2018 and abdomen in 07/2018 shows no evidence of malignancy .   She says she is losing weight. She says she cannot eat, causes her  abdomen to swell and gets sick. Lots of bloating when she eats. Not eating much and not having much of a bowel movement  On linzess works when she takes it . Has a bowel movenent daily with linzess.  She says she cant eat due to poor apetite. Feels full. Not on any vicodin.   Dr Mike Gip is her oncologist     Current Outpatient Medications  Medication Sig Dispense Refill  . albuterol (PROVENTIL HFA;VENTOLIN HFA) 108 (90 Base) MCG/ACT inhaler Inhale 1-2 puffs into the lungs every 4 (four) hours as needed for wheezing or shortness of breath. 1 Inhaler 12  . ALPRAZolam (XANAX) 0.5 MG tablet Take 1 tablet (0.5 mg total) by mouth 3 (three) times daily as needed for sleep or anxiety. 30 tablet 0  . azithromycin (ZITHROMAX) 250 MG tablet   0  . benzonatate (TESSALON) 200 MG capsule   0  . cyclobenzaprine (FLEXERIL) 5 MG tablet Take 1-2 tablets 3 times daily as needed 20 tablet 0  . fluticasone (FLONASE) 50 MCG/ACT nasal spray Place into the nose.    Marland Kitchen guaiFENesin-codeine 100-10 MG/5ML syrup Take by mouth.    Marland Kitchen HYDROcodone-acetaminophen (NORCO/VICODIN) 5-325 MG tablet Take 1 tablet by mouth every 6 (six) hours as needed for up to 30 days for moderate pain or severe pain. 45 tablet 0  . lactulose (CEPHULAC) 20 g packet Take 1 packet (20 g total) by mouth 3 (three) times daily. (Patient not taking: Reported on 07/18/2018) 30 each 0  . lidocaine (LIDODERM) 5 % Place 1 patch onto the skin every 12 (  twelve) hours. Remove & Discard patch within 12 hours or as directed by MD 10 patch 0  . linaclotide (LINZESS) 72 MCG capsule Take by mouth.    Marland Kitchen omeprazole (PRILOSEC) 40 MG capsule Take 1 capsule (40 mg total) by mouth daily for 90 doses. 90 capsule 0  . omeprazole (PRILOSEC) 40 MG capsule Take 1 capsule (40 mg total) by mouth daily. 90 capsule 3  . ondansetron (ZOFRAN) 4 MG tablet Take 4 mg by mouth as needed.     . polyethylene glycol powder (GLYCOLAX/MIRALAX) powder Take 255 g by mouth daily. (Patient  not taking: Reported on 05/31/2018) 255 g 3  . predniSONE (DELTASONE) 20 MG tablet Take 1 tablet (20 mg total) by mouth daily with breakfast. 10 days 10 tablet 0  . predniSONE (DELTASONE) 20 MG tablet Take 1 tablet (20 mg total) by mouth daily. 5 tablet 0  . ranitidine (ZANTAC) 150 MG tablet Take 150 mg by mouth daily.     . sucralfate (CARAFATE) 1 g tablet Take 1 tablet (1 g total) by mouth 4 (four) times daily. 244 tablet 0  . umeclidinium-vilanterol (ANORO ELLIPTA) 62.5-25 MCG/INH AEPB Inhale 1 puff into the lungs daily. 1 each 12   No current facility-administered medications for this visit.     Allergies as of 01/13/2019  . (No Known Allergies)    Review of Systems:    All systems reviewed and negative except where noted in HPI.  General Appearance:    Alert, cooperative, no distress, appears stated age  Head:    Normocephalic, without obvious abnormality, atraumatic  Eyes:    PERRL, conjunctiva/corneas clear,  Ears:    Grossly normal hearing    Neurologic:  Grossly normal    Observations/Objective:  Labs: CMP     Component Value Date/Time   NA 148 (H) 05/29/2018 0947   K 4.9 05/29/2018 0947   CL 108 (H) 05/29/2018 0947   CO2 25 05/29/2018 0947   GLUCOSE 51 (L) 05/29/2018 0947   GLUCOSE 82 11/19/2016 0843   BUN 14 05/29/2018 0947   CREATININE 1.00 08/02/2018 1141   CALCIUM 9.7 05/29/2018 0947   PROT 6.6 05/29/2018 0947   ALBUMIN 4.2 05/29/2018 0947   AST 13 05/29/2018 0947   ALT 13 05/29/2018 0947   ALKPHOS 70 05/29/2018 0947   BILITOT 0.2 05/29/2018 0947   GFRNONAA 76 05/29/2018 0947   GFRAA 88 05/29/2018 0947   Lab Results  Component Value Date   WBC 7.6 05/29/2018   HGB 12.9 05/29/2018   HCT 40.9 05/29/2018   MCV 92 05/29/2018   PLT 422 05/29/2018    Imaging Studies: Ct Chest Wo Contrast  Result Date: 12/24/2018 CLINICAL DATA:  Cough for 3 weeks, history of left lung cancer EXAM: CT CHEST WITHOUT CONTRAST TECHNIQUE: Multidetector CT imaging of the  chest was performed following the standard protocol without IV contrast. COMPARISON:  CT chest, 11/19/2016, CT chest abdomen pelvis, 08/02/2010 FINDINGS: Cardiovascular: Scattered left coronary artery calcifications. Normal heart size. No pericardial effusion. Mediastinum/Nodes: No enlarged mediastinal, hilar, or axillary lymph nodes. Thyroid gland, trachea, and esophagus demonstrate no significant findings. Lungs/Pleura: Status post left upper lobectomy. Moderate centrilobular emphysema. 5 mm nodule of the right upper lobe, stable and benign. No pleural effusion or pneumothorax. Upper Abdomen: No acute abnormality. Musculoskeletal: No chest wall mass or suspicious bone lesions identified. IMPRESSION: 1. No acute CT findings to explain cough. No acute airspace opacity. 2.  Status post left upper lobectomy. 3.  Emphysema. 4.  Stable benign small pulmonary nodule of the right upper lobe. 5.  Coronary artery disease. Electronically Signed   By: Eddie Candle M.D.   On: 12/24/2018 15:32    Assessment and Plan:   Sheila Suarez is a 63 y.o. y/o female here for follow up for IBS-C and GERD. 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 She has lost a lot of weight. Recent CT chest and abdomen negative for active malignancy. She follows with Dr Mike Gip in the past. Only GI issue is bloating , poor appetite. Recent EGD in 1/.2020 was negative  1.Get gastric emptying study to r/o gastroparesis 2, Refer to Dr Mike Gip : H/o lung cancer ?? Any role for PET scan if gastric emptying scan is negative and my examination is negative in 3 weeks .  3. Trial of Xifaxan for bloating   F/u in 3 weeks    I discussed the assessment and treatment plan with the patient. The patient was provided an opportunity to ask questions and all were answered. The patient agreed with the plan and demonstrated an understanding of the instructions.   The patient was advised to call back or seek an in-person evaluation if the symptoms  worsen or if the condition fails to improve as anticipated.  I provided 12 minutes of face-to-face time during this encounter.  Dr Sheila Bellows MD,MRCP [facility]) Gastroenterology/Hepatology Pager: [phone]   Speech recognition software was used to dictate this note.

## 2019-01-13 NOTE — Telephone Encounter (Signed)
Called pt to pre-chart for today's e-visit with Dr. Anna  Unable to contact. LVM to return call 

## 2019-01-16 ENCOUNTER — Other Ambulatory Visit: Payer: Self-pay

## 2019-01-16 MED ORDER — AMOXICILLIN-POT CLAVULANATE 875-125 MG PO TABS: 1.0000 | ORAL_TABLET | Freq: Two times a day (BID) | ORAL | 0 refills | Status: AC

## 2019-01-16 NOTE — Progress Notes (Signed)
Spoke with Sheila Suarez and informed her that her insurance would not cover the cost of the [place]. Dr. Vicente Males recommends Augmentin twice daily for one week as an alternative medication. Sheila Suarez agrees.

## 2019-01-16 NOTE — Patient Instructions (Signed)
I tried to reach the patient via telephone without success.  Will reschedule for one month.  JA

## 2019-01-21 ENCOUNTER — Encounter: Payer: Self-pay | Admitting: Gastroenterology

## 2019-01-21 ENCOUNTER — Other Ambulatory Visit: Payer: Self-pay

## 2019-01-21 ENCOUNTER — Ambulatory Visit
Admission: RE | Admit: 2019-01-21 | Discharge: 2019-01-21 | Disposition: A | Discharge status: Home / Self Care | Payer: Medicare Other | Source: Ambulatory Visit | Attending: Gastroenterology | Admitting: Gastroenterology

## 2019-01-21 DIAGNOSIS — R634 Abnormal weight loss: Secondary | ICD-10-CM | POA: Insufficient documentation

## 2019-01-21 DIAGNOSIS — R630 Anorexia: Secondary | ICD-10-CM | POA: Diagnosis not present

## 2019-01-21 DIAGNOSIS — K30 Functional dyspepsia: Secondary | ICD-10-CM

## 2019-01-21 DIAGNOSIS — R140 Abdominal distension (gaseous): Secondary | ICD-10-CM | POA: Diagnosis not present

## 2019-01-21 MED ORDER — TECHNETIUM TC 99M SULFUR COLLOID
2.0000 | Freq: Once | INTRAVENOUS | Status: AC | PRN
  Administered 2019-01-21: 2.69 via INTRAVENOUS

## 2019-01-24 ENCOUNTER — Other Ambulatory Visit: Payer: Self-pay

## 2019-01-24 ENCOUNTER — Other Ambulatory Visit
Admission: RE | Admit: 2019-01-24 | Discharge: 2019-01-24 | Disposition: A | Discharge status: Home / Self Care | Payer: Medicare Other | Source: Ambulatory Visit | Attending: Anesthesiology | Admitting: Anesthesiology

## 2019-01-24 DIAGNOSIS — Z1159 Encounter for screening for other viral diseases: Secondary | ICD-10-CM | POA: Insufficient documentation

## 2019-01-25 LAB — NOVEL CORONAVIRUS, NAA (HOSP ORDER, SEND-OUT TO REF LAB; TAT 18-24 HRS): SARS-CoV-2, NAA: NOT DETECTED

## 2019-01-28 ENCOUNTER — Ambulatory Visit (HOSPITAL_BASED_OUTPATIENT_CLINIC_OR_DEPARTMENT_OTHER): Payer: Medicare Other | Admitting: Anesthesiology

## 2019-01-28 ENCOUNTER — Other Ambulatory Visit: Payer: Self-pay

## 2019-01-28 ENCOUNTER — Encounter: Payer: Self-pay | Admitting: Anesthesiology

## 2019-01-28 ENCOUNTER — Ambulatory Visit
Admission: RE | Admit: 2019-01-28 | Discharge: 2019-01-28 | Disposition: A | Discharge status: Home / Self Care | Payer: Medicare Other | Source: Ambulatory Visit | Attending: Anesthesiology | Admitting: Anesthesiology

## 2019-01-28 ENCOUNTER — Other Ambulatory Visit: Payer: Self-pay | Admitting: Anesthesiology

## 2019-01-28 VITALS — BP 112/95 | HR 73 | Temp 97.5°F | Resp 18 | Ht 65.0 in | Wt 106.0 lb

## 2019-01-28 DIAGNOSIS — M5386 Other specified dorsopathies, lumbar region: Secondary | ICD-10-CM

## 2019-01-28 DIAGNOSIS — M79605 Pain in left leg: Secondary | ICD-10-CM

## 2019-01-28 DIAGNOSIS — M79604 Pain in right leg: Secondary | ICD-10-CM | POA: Insufficient documentation

## 2019-01-28 DIAGNOSIS — R52 Pain, unspecified: Secondary | ICD-10-CM | POA: Insufficient documentation

## 2019-01-28 DIAGNOSIS — M5136 Other intervertebral disc degeneration, lumbar region: Secondary | ICD-10-CM

## 2019-01-28 HISTORY — DX: Other specified dorsopathies, lumbar region: M53.86

## 2019-01-28 MED ORDER — DEXAMETHASONE SODIUM PHOSPHATE 10 MG/ML IJ SOLN
INTRAMUSCULAR | Status: AC
  Filled 2019-01-28: qty 1

## 2019-01-28 MED ORDER — LACTATED RINGERS IV SOLN
1000.0000 mL | INTRAVENOUS | Status: DC
  Administered 2019-01-28: 13:00:00 1000 mL via INTRAVENOUS

## 2019-01-28 MED ORDER — IOPAMIDOL (ISOVUE-M 200) INJECTION 41%: 20.0000 mL | Freq: Once | INTRAMUSCULAR | Status: DC | PRN

## 2019-01-28 MED ORDER — LIDOCAINE 5 % EX PTCH: 1.0000 | MEDICATED_PATCH | CUTANEOUS | 0 refills | Status: DC

## 2019-01-28 MED ORDER — TRIAMCINOLONE ACETONIDE 40 MG/ML IJ SUSP
INTRAMUSCULAR | Status: AC
  Filled 2019-01-28: qty 1

## 2019-01-28 MED ORDER — MIDAZOLAM HCL 5 MG/5ML IJ SOLN
5.0000 mg | Freq: Once | INTRAMUSCULAR | Status: AC
  Administered 2019-01-28: 1 mg via INTRAVENOUS
  Filled 2019-01-28: qty 5

## 2019-01-28 MED ORDER — LIDOCAINE HCL (PF) 1 % IJ SOLN
INTRAMUSCULAR | Status: AC
  Filled 2019-01-28: qty 5

## 2019-01-28 MED ORDER — TRIAMCINOLONE ACETONIDE 40 MG/ML IJ SUSP
40.0000 mg | Freq: Once | INTRAMUSCULAR | Status: AC
  Administered 2019-01-28: 40 mg

## 2019-01-28 MED ORDER — ROPIVACAINE HCL 2 MG/ML IJ SOLN
10.0000 mL | Freq: Once | INTRAMUSCULAR | Status: AC
  Administered 2019-01-28: 13:00:00 10 mL via EPIDURAL

## 2019-01-28 MED ORDER — SODIUM CHLORIDE 0.9% FLUSH
10.0000 mL | Freq: Once | INTRAVENOUS | Status: AC
  Administered 2019-01-28: 10 mL

## 2019-01-28 MED ORDER — ROPIVACAINE HCL 2 MG/ML IJ SOLN
INTRAMUSCULAR | Status: AC
  Filled 2019-01-28: qty 10

## 2019-01-28 MED ORDER — LIDOCAINE HCL (PF) 1 % IJ SOLN
5.0000 mL | Freq: Once | INTRAMUSCULAR | Status: AC
  Administered 2019-01-28: 13:00:00 5 mL via SUBCUTANEOUS

## 2019-01-28 MED ORDER — GABAPENTIN 100 MG PO CAPS: 100.0000 mg | ORAL_CAPSULE | Freq: Every day | ORAL | 2 refills | Status: AC

## 2019-01-28 MED ORDER — SODIUM CHLORIDE (PF) 0.9 % IJ SOLN
INTRAMUSCULAR | Status: AC
  Filled 2019-01-28: qty 10

## 2019-01-28 NOTE — Progress Notes (Signed)
Safety precautions to be maintained throughout the outpatient stay will include: orient to surroundings, keep bed in low position, maintain call bell within reach at all times, provide assistance with transfer out of bed and ambulation.  

## 2019-01-28 NOTE — Progress Notes (Signed)
Subjective:  Patient ID: Sheila Suarez, female    DOB: 1955/12/31  Age: 63 y.o. MRN: [id number]  CC: Chest Pain (left lateral)   Procedure: L5-S1 epidural steroid under fluoroscopic guidance with moderate sedation  HPI Sheila Suarez presents for reevaluation.  She was last seen several months ago and continues to have low back pain with radiation into both calves.  In the past she has had epidural steroid injections with good relief.  Her last injection was back in December last year.  No change in bowel or bladder function is noted she has had some give way weakness and almost fell the other day as both legs buckled.  Her strength is good today she reports and bowel bladder function has been stable.  She is continuing to have some severe left side chest wall pain consistent with what she has had in the past.  She rates this as no change but this is been a constant problem for her.  We have done intercostal nerve blocks and these helped transiently for that and the quality of pain is the same.  No new changes in pulmonary function are noted per our discussion today.  Outpatient Medications Prior to Visit  Medication Sig Dispense Refill  . albuterol (PROVENTIL HFA;VENTOLIN HFA) 108 (90 Base) MCG/ACT inhaler Inhale 1-2 puffs into the lungs every 4 (four) hours as needed for wheezing or shortness of breath. 1 Inhaler 12  . ALPRAZolam (XANAX) 0.5 MG tablet Take 1 tablet (0.5 mg total) by mouth 3 (three) times daily as needed for sleep or anxiety. 30 tablet 0  . azithromycin (ZITHROMAX) 250 MG tablet   0  . benzonatate (TESSALON) 200 MG capsule   0  . cyclobenzaprine (FLEXERIL) 5 MG tablet Take 1-2 tablets 3 times daily as needed 20 tablet 0  . fluticasone (FLONASE) 50 MCG/ACT nasal spray Place into the nose.    Marland Kitchen guaiFENesin-codeine 100-10 MG/5ML syrup Take by mouth.    Marland Kitchen HYDROcodone-acetaminophen (NORCO/VICODIN) 5-325 MG tablet Take 1 tablet by mouth every 6 (six) hours as needed for up to  30 days for moderate pain or severe pain. 45 tablet 0  . lidocaine (LIDODERM) 5 % Place 1 patch onto the skin every 12 (twelve) hours. Remove & Discard patch within 12 hours or as directed by MD 10 patch 0  . linaclotide (LINZESS) 72 MCG capsule Take by mouth.    Marland Kitchen omeprazole (PRILOSEC) 40 MG capsule Take 1 capsule (40 mg total) by mouth daily. 90 capsule 3  . ondansetron (ZOFRAN) 4 MG tablet Take 4 mg by mouth as needed.     . predniSONE (DELTASONE) 20 MG tablet Take 1 tablet (20 mg total) by mouth daily with breakfast. 10 days 10 tablet 0  . predniSONE (DELTASONE) 20 MG tablet Take 1 tablet (20 mg total) by mouth daily. 5 tablet 0  . ranitidine (ZANTAC) 150 MG tablet Take 150 mg by mouth daily.     Marland Kitchen umeclidinium-vilanterol (ANORO ELLIPTA) 62.5-25 MCG/INH AEPB Inhale 1 puff into the lungs daily. 1 each 12  . lactulose (CEPHULAC) 20 g packet Take 1 packet (20 g total) by mouth 3 (three) times daily. (Patient not taking: Reported on 01/28/2019) 30 each 0  . omeprazole (PRILOSEC) 40 MG capsule Take 1 capsule (40 mg total) by mouth daily for 90 doses. 90 capsule 0  . polyethylene glycol powder (GLYCOLAX/MIRALAX) powder Take 255 g by mouth daily. (Patient not taking: Reported on 05/31/2018) 255 g 3  . sucralfate (CARAFATE) 1  g tablet Take 1 tablet (1 g total) by mouth 4 (four) times daily. 244 tablet 0   No facility-administered medications prior to visit.     Review of Systems CNS: No confusion or sedation Cardiac: No angina or palpitations GI: No abdominal pain or constipation Constitutional: No nausea vomiting fevers or chills  Objective:  BP (!) 120/100   Pulse 73   Temp (!) 97.3 F (36.3 C)   Resp 13   Ht 5\' 5"  (1.651 m)   Wt 106 lb (48.1 kg)   SpO2 100%   BMI 17.64 kg/m    BP Readings from Last 3 Encounters:  01/28/19 (!) 120/100  07/18/18 111/75  07/08/18 (!) 149/87     Wt Readings from Last 3 Encounters:  01/28/19 106 lb (48.1 kg)  07/18/18 111 lb 12.8 oz (50.7 kg)   07/08/18 112 lb (50.8 kg)     Physical Exam Pt is alert and oriented PERRL EOMI HEART IS RRR no murmur or rub LCTA no wheezing or rales MUSCULOSKELETAL some paraspinous muscle tenderness but no overt trigger points.  She is ambulating well.  Labs  No results found for: HGBA1C Lab Results  Component Value Date   CREATININE 1.00 08/02/2018    -------------------------------------------------------------------------------------------------------------------- Lab Results  Component Value Date   WBC 7.6 05/29/2018   HGB 12.9 05/29/2018   HCT 40.9 05/29/2018   PLT 422 05/29/2018   GLUCOSE 51 (L) 05/29/2018   ALT 13 05/29/2018   AST 13 05/29/2018   NA 148 (H) 05/29/2018   K 4.9 05/29/2018   CL 108 (H) 05/29/2018   CREATININE 1.00 08/02/2018   BUN 14 05/29/2018   CO2 25 05/29/2018   TSH 0.538 05/29/2018   INR 0.91 03/28/2016    --------------------------------------------------------------------------------------------------------------------- [facility] C-arm 1-60 Min No Report  Result Date: 01/28/2019 Fluoro was used, but no Radiologist interpretation will be provided. Please refer to "NOTES" tab for provider progress note.    Assessment & Plan:   [place] was seen today for chest pain.  Diagnoses and all orders for this visit:  Leg pain, bilateral  DDD (degenerative disc disease), lumbar  Sciatica associated with disorder of lumbar spine  Other orders -     lidocaine (LIDODERM) 5 %; Place 1 patch onto the skin daily. Remove & Discard patch within 12 hours or as directed by MD -     triamcinolone acetonide (KENALOG-40) injection 40 mg -     sodium chloride flush (NS) 0.9 % injection 10 mL -     ropivacaine (PF) 2 mg/mL (0.2%) (NAROPIN) injection 10 mL -     midazolam (VERSED) 5 MG/5ML injection 5 mg -     lidocaine (PF) (XYLOCAINE) 1 % injection 5 mL -     lactated ringers infusion 1,000 mL -     iopamidol (ISOVUE-M) 41 % intrathecal injection 20 mL -      gabapentin (NEURONTIN) 100 MG capsule; Take 1 capsule (100 mg total) by mouth at bedtime. May take a second capsule at bedside if necessary        ----------------------------------------------------------------------------------------------------------------------  Problem List Items Addressed This Visit      Unprioritized   DDD (degenerative disc disease), lumbar   Leg pain, bilateral - Primary   Sciatica associated with disorder of lumbar spine   Relevant Medications   gabapentin (NEURONTIN) 100 MG capsule        ----------------------------------------------------------------------------------------------------------------------  1. Leg pain, bilateral We will proceed with an epidural injection today as reviewed  with her in full detail.  All questions been answered.  We will have her return to clinic in 1 month for repeat L5-S1 epidural.  In the meantime continue with back stretching strengthening exercises.  2. DDD (degenerative disc disease), lumbar As above  3. Sciatica associated with disorder of lumbar spine As above 4.  Left side chest wall pain status post thoracotomy.  I am going to prescribe Lidoderm patch to be applied on 12 hours off 12 hours to see if this can help with some of the cutaneous pain she is having.  Furthermore she is not sleeping at night and I am going to restart the gabapentin 100 mg tablet at bedtimes x1.  I will see if this can give her some better relief at nighttime and better sleep.  She is scheduled for return to clinic in 1 month for repeat epidural.    ----------------------------------------------------------------------------------------------------------------------  I am having [place] Heidelberger start on lidocaine and gabapentin. I am also having her maintain her ondansetron, ranitidine, omeprazole, lactulose, polyethylene glycol powder, umeclidinium-vilanterol, albuterol, cyclobenzaprine, lidocaine, linaclotide, ALPRAZolam,  azithromycin, benzonatate, fluticasone, guaiFENesin-codeine, sucralfate, omeprazole, predniSONE, predniSONE, and HYDROcodone-acetaminophen. We administered triamcinolone acetonide, sodium chloride flush, ropivacaine (PF) 2 mg/mL (0.2%), midazolam, lidocaine (PF), and lactated ringers.   Meds ordered this encounter  Medications  . lidocaine (LIDODERM) 5 %    Sig: Place 1 patch onto the skin daily. Remove & Discard patch within 12 hours or as directed by MD    Dispense:  30 patch    Refill:  0  . triamcinolone acetonide (KENALOG-40) injection 40 mg  . sodium chloride flush (NS) 0.9 % injection 10 mL  . ropivacaine (PF) 2 mg/mL (0.2%) (NAROPIN) injection 10 mL  . midazolam (VERSED) 5 MG/5ML injection 5 mg  . lidocaine (PF) (XYLOCAINE) 1 % injection 5 mL  . lactated ringers infusion 1,000 mL  . iopamidol (ISOVUE-M) 41 % intrathecal injection 20 mL  . gabapentin (NEURONTIN) 100 MG capsule    Sig: Take 1 capsule (100 mg total) by mouth at bedtime. May take a second capsule at bedside if necessary    Dispense:  60 capsule    Refill:  2   Patient's Medications  New Prescriptions   GABAPENTIN (NEURONTIN) 100 MG CAPSULE    Take 1 capsule (100 mg total) by mouth at bedtime. May take a second capsule at bedside if necessary   LIDOCAINE (LIDODERM) 5 %    Place 1 patch onto the skin daily. Remove & Discard patch within 12 hours or as directed by MD  Previous Medications   ALBUTEROL (PROVENTIL HFA;VENTOLIN HFA) 108 (90 BASE) MCG/ACT INHALER    Inhale 1-2 puffs into the lungs every 4 (four) hours as needed for wheezing or shortness of breath.   ALPRAZOLAM (XANAX) 0.5 MG TABLET    Take 1 tablet (0.5 mg total) by mouth 3 (three) times daily as needed for sleep or anxiety.   AZITHROMYCIN (ZITHROMAX) 250 MG TABLET       BENZONATATE (TESSALON) 200 MG CAPSULE       CYCLOBENZAPRINE (FLEXERIL) 5 MG TABLET    Take 1-2 tablets 3 times daily as needed   FLUTICASONE (FLONASE) 50 MCG/ACT NASAL SPRAY    Place into  the nose.   GUAIFENESIN-CODEINE 100-10 MG/5ML SYRUP    Take by mouth.   HYDROCODONE-ACETAMINOPHEN (NORCO/VICODIN) 5-325 MG TABLET    Take 1 tablet by mouth every 6 (six) hours as needed for up to 30 days for moderate pain or severe pain.  LACTULOSE (CEPHULAC) 20 G PACKET    Take 1 packet (20 g total) by mouth 3 (three) times daily.   LIDOCAINE (LIDODERM) 5 %    Place 1 patch onto the skin every 12 (twelve) hours. Remove & Discard patch within 12 hours or as directed by MD   LINACLOTIDE (LINZESS) 72 MCG CAPSULE    Take by mouth.   OMEPRAZOLE (PRILOSEC) 40 MG CAPSULE    Take 1 capsule (40 mg total) by mouth daily for 90 doses.   OMEPRAZOLE (PRILOSEC) 40 MG CAPSULE    Take 1 capsule (40 mg total) by mouth daily.   ONDANSETRON (ZOFRAN) 4 MG TABLET    Take 4 mg by mouth as needed.    POLYETHYLENE GLYCOL POWDER (GLYCOLAX/MIRALAX) POWDER    Take 255 g by mouth daily.   PREDNISONE (DELTASONE) 20 MG TABLET    Take 1 tablet (20 mg total) by mouth daily with breakfast. 10 days   PREDNISONE (DELTASONE) 20 MG TABLET    Take 1 tablet (20 mg total) by mouth daily.   RANITIDINE (ZANTAC) 150 MG TABLET    Take 150 mg by mouth daily.    SUCRALFATE (CARAFATE) 1 G TABLET    Take 1 tablet (1 g total) by mouth 4 (four) times daily.   UMECLIDINIUM-VILANTEROL (ANORO ELLIPTA) 62.5-25 MCG/INH AEPB    Inhale 1 puff into the lungs daily.  Modified Medications   No medications on file  Discontinued Medications   No medications on file   ----------------------------------------------------------------------------------------------------------------------  Follow-up: Return in about 1 month (around 02/27/2019) for evaluation, procedure.  Procedure: L5-S1 epidural steroid and fluoroscopic guidance with moderate sedation   Procedure: L 5 S1 LESI with fluoroscopic guidance and with moderate sedation  NOTE: The risks, benefits, and expectations of the procedure have been discussed and explained to the patient who was  understanding and in agreement with suggested treatment plan. No guarantees were made.  DESCRIPTION OF PROCEDURE: Lumbar epidural steroid injection with 1.28m g IV Versed, EKG, blood pressure, pulse, and pulse oximetry monitoring. The procedure was performed with the patient in the prone position under fluoroscopic guidance.  Sterile prep x3 was initiated and I then injected subcutaneous lidocaine to the overlying L5-S1 site after its fluoroscopic identifictation.  Using strict aseptic technique, I then advanced an 18-gauge Tuohy epidural needle in the midline using interlaminar approach via loss-of-resistance to saline technique. There was negative aspiration for heme or  CSF.  I then confirmed position with both AP and Lateral fluoroscan.  2 cc of opaque were injected and a  total of 5 mL of Preservative-Free normal saline mixed with 40 mg of Kenalog and 1cc Ropicaine 0.2 percent were injected incrementally via the  epidurally placed needle. The needle was removed. The patient tolerated the injection well and was convalesced and discharged to home in stable condition. Should the patient have any post procedure difficulty they have been instructed on how to contact us for assistance.    Molli Barrows, MD

## 2019-02-03 DIAGNOSIS — R0602 Shortness of breath: Secondary | ICD-10-CM | POA: Diagnosis not present

## 2019-02-03 DIAGNOSIS — I2510 Atherosclerotic heart disease of native coronary artery without angina pectoris: Secondary | ICD-10-CM | POA: Diagnosis not present

## 2019-02-03 DIAGNOSIS — R600 Localized edema: Secondary | ICD-10-CM | POA: Diagnosis not present

## 2019-02-03 DIAGNOSIS — I340 Nonrheumatic mitral (valve) insufficiency: Secondary | ICD-10-CM | POA: Diagnosis not present

## 2019-02-04 ENCOUNTER — Other Ambulatory Visit: Payer: Self-pay

## 2019-02-04 ENCOUNTER — Ambulatory Visit (INDEPENDENT_AMBULATORY_CARE_PROVIDER_SITE_OTHER): Payer: Medicare Other | Admitting: Gastroenterology

## 2019-02-04 ENCOUNTER — Encounter: Payer: Self-pay | Admitting: Gastroenterology

## 2019-02-04 VITALS — BP 105/67 | HR 64 | Temp 98.3°F | Ht 65.0 in | Wt 101.8 lb

## 2019-02-04 DIAGNOSIS — R634 Abnormal weight loss: Secondary | ICD-10-CM | POA: Diagnosis not present

## 2019-02-04 NOTE — Progress Notes (Signed)
[name] MD, MRCP(U.K) [address]  [place]  [place], [place] [address]  Main: [phone]  Fax: [phone]   Primary Care Physician: Patient, No Pcp Per  Primary Gastroenterologist:  Dr. [name]   Follow up abdominal pain   HPI: Candiss Galeana is a 63 y.o. female    Summary of history : She has a history of abdominal pain ongoing for many years, upper abdomen, severe constipation and relief after a bowel movement . She has been on narcotics. She had a normal colonoscopy on 06/02/2016 and an EGD which appeared normal but biopsies taken showed active chronic gastritis with H pylori .triple therapy prescribed on 06/16/16 .completed treatment for H pylori.Eradication confirmed subsequently.She is a S/p left thoracotomy and LUL lobectomy .LFT's normal in 05/2016 . PET scan in 01/2016 showed only some mild hypermetabolism in the stomach and rest of GI tract was negative.CT chest in 11/2018 and abdomen in 07/2018 shows no evidence of malignancy . Dr Mike Gip is her oncologist. On trulance commenced at last office visit  EGD 05/31/18 :Gastritis -bx showed reactive gastropathy. CBC,CMP- normal in 04/2018 .   Interval history 01/13/2019-02/04/2019  01/21/2019: Gastric emptying study  Normal Lost 10 lbs since last visit . Apetite is poor. She wants to eat : when about to eat loses her apetite.   She has normal bowel movements with linzess.  Bloating resolved after trial of xifaxan.  No blood in her stool  No abdominal pain or issues with swallowing .   Current Outpatient Medications  Medication Sig Dispense Refill  . albuterol (PROVENTIL HFA;VENTOLIN HFA) 108 (90 Base) MCG/ACT inhaler Inhale 1-2 puffs into the lungs every 4 (four) hours as needed for wheezing or shortness of breath. 1 Inhaler 12  . ALPRAZolam (XANAX) 0.5 MG tablet Take 1 tablet (0.5 mg total) by mouth 3 (three) times daily as needed for sleep or anxiety. 30 tablet 0  . azithromycin (ZITHROMAX)  250 MG tablet   0  . benzonatate (TESSALON) 200 MG capsule   0  . cyclobenzaprine (FLEXERIL) 5 MG tablet Take 1-2 tablets 3 times daily as needed 20 tablet 0  . fluticasone (FLONASE) 50 MCG/ACT nasal spray Place into the nose.    . gabapentin (NEURONTIN) 100 MG capsule Take 1 capsule (100 mg total) by mouth at bedtime. May take a second capsule at bedside if necessary 60 capsule 2  . guaiFENesin-codeine 100-10 MG/5ML syrup Take by mouth.    Marland Kitchen HYDROcodone-acetaminophen (NORCO/VICODIN) 5-325 MG tablet Take 1 tablet by mouth every 6 (six) hours as needed for up to 30 days for moderate pain or severe pain. 45 tablet 0  . lactulose (CEPHULAC) 20 g packet Take 1 packet (20 g total) by mouth 3 (three) times daily. (Patient not taking: Reported on 01/28/2019) 30 each 0  . lidocaine (LIDODERM) 5 % Place 1 patch onto the skin every 12 (twelve) hours. Remove & Discard patch within 12 hours or as directed by MD 10 patch 0  . lidocaine (LIDODERM) 5 % Place 1 patch onto the skin daily. Remove & Discard patch within 12 hours or as directed by MD 30 patch 0  . linaclotide (LINZESS) 72 MCG capsule Take by mouth.    Marland Kitchen omeprazole (PRILOSEC) 40 MG capsule Take 1 capsule (40 mg total) by mouth daily for 90 doses. 90 capsule 0  . omeprazole (PRILOSEC) 40 MG capsule Take 1 capsule (40 mg total) by mouth daily. 90 capsule 3  . ondansetron (ZOFRAN) 4 MG tablet  Take 4 mg by mouth as needed.     . polyethylene glycol powder (GLYCOLAX/MIRALAX) powder Take 255 g by mouth daily. (Patient not taking: Reported on 05/31/2018) 255 g 3  . predniSONE (DELTASONE) 20 MG tablet Take 1 tablet (20 mg total) by mouth daily with breakfast. 10 days 10 tablet 0  . predniSONE (DELTASONE) 20 MG tablet Take 1 tablet (20 mg total) by mouth daily. 5 tablet 0  . ranitidine (ZANTAC) 150 MG tablet Take 150 mg by mouth daily.     . sucralfate (CARAFATE) 1 g tablet Take 1 tablet (1 g total) by mouth 4 (four) times daily. 244 tablet 0  .  umeclidinium-vilanterol (ANORO ELLIPTA) 62.5-25 MCG/INH AEPB Inhale 1 puff into the lungs daily. 1 each 12   No current facility-administered medications for this visit.     Allergies as of 02/04/2019  . (No Known Allergies)    ROS:  General: Negative for anorexia, weight loss, fever, chills, fatigue, weakness. ENT: Negative for hoarseness, difficulty swallowing , nasal congestion. CV: Negative for chest pain, angina, palpitations, dyspnea on exertion, peripheral edema.  Respiratory: Negative for dyspnea at rest, dyspnea on exertion, cough, sputum, wheezing.  GI: See history of present illness. GU:  Negative for dysuria, hematuria, urinary incontinence, urinary frequency, nocturnal urination.  Endo: Negative for unusual weight change.    Physical Examination:   There were no vitals taken for this visit.  General: Well-nourished, well-developed in no acute distress.  Eyes: No icterus. Conjunctivae pink. Mouth: Oropharyngeal mucosa moist and pink , no lesions erythema or exudate. Lungs: Clear to auscultation bilaterally. Non-labored. Heart: Regular rate and rhythm, no murmurs rubs or gallops.  Abdomen: Bowel sounds are normal, nontender, nondistended, no hepatosplenomegaly or masses, no abdominal bruits or hernia , no rebound or guarding.   Extremities: No lower extremity edema. No clubbing or deformities. Neuro: Alert and oriented x 3.  Grossly intact. Skin: Warm and dry, no jaundice.   Psych: Alert and cooperative, normal mood and affect.   Imaging Studies: Nm Gastric Emptying  Result Date: 01/21/2019 CLINICAL DATA:  Abdominal bloating and loss of appetite EXAM: NUCLEAR MEDICINE GASTRIC EMPTYING SCAN TECHNIQUE: After oral ingestion of radiolabeled meal, sequential abdominal images were obtained for 3 hours. Percentage of activity emptying the stomach was calculated at 1 hour, 2 hours, and 3 hours. RADIOPHARMACEUTICALS:  2.69 mCi Tc-60m sulfur colloid in standardized meal  including egg COMPARISON:  None. FINDINGS: Expected location of the stomach in the left upper quadrant. Ingested meal empties the stomach gradually over the course of the study. 64% emptied at 1 hr ( normal >= 10%) 95% emptied at 2 hr ( normal >= 40%) 99% emptied at 3 hr ( normal >= 70%) Normal emptying of greater than 90% after 4 hours post radiotracer administration.) IMPRESSION: Normal gastric emptying study. Electronically Signed   By: Lowella Grip III M.D.   On: 01/21/2019 12:07   [facility] C-arm 1-60 Min No Report  Result Date: 01/28/2019 Fluoro was used, but no Radiologist interpretation will be provided. Please refer to "NOTES" tab for provider progress note.   Assessment and Plan:   Ellise Kovack is a 63 y.o. 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 She has lost a lot of weight. Recent CT chest and abdomen negative for active malignancy. She follows with Dr Mike Gip in the past. Recent EGD in 1/.2020 was negative, colonoscopy in 2017 was negative. No active GI issues at present  1.  Discussed with Dr Mike Gip - she will have the patient seen at the Cancer center to discuss if any further work up is needed.  2. CBC,CMP,TSH  Dr [name]  MD,MRCP [facility]) Follow up PRN

## 2019-02-05 LAB — CBC WITH DIFFERENTIAL/PLATELET
Basophils Absolute: 0 10*3/uL (ref 0.0–0.2)
Basos: 0 %
EOS (ABSOLUTE): 0 10*3/uL (ref 0.0–0.4)
Eos: 0 %
Hematocrit: 42.7 % (ref 34.0–46.6)
Hemoglobin: 14.1 g/dL (ref 11.1–15.9)
Immature Grans (Abs): 0 10*3/uL (ref 0.0–0.1)
Immature Granulocytes: 0 %
Lymphocytes Absolute: 2.1 10*3/uL (ref 0.7–3.1)
Lymphs: 18 %
MCH: 30.7 pg (ref 26.6–33.0)
MCHC: 33 g/dL (ref 31.5–35.7)
MCV: 93 fL (ref 79–97)
Monocytes Absolute: 0.8 10*3/uL (ref 0.1–0.9)
Monocytes: 7 %
Neutrophils Absolute: 8.9 10*3/uL — ABNORMAL HIGH (ref 1.4–7.0)
Neutrophils: 75 %
Platelets: 515 10*3/uL — ABNORMAL HIGH (ref 150–450)
RBC: 4.59 x10E6/uL (ref 3.77–5.28)
RDW: 12 % (ref 11.7–15.4)
WBC: 11.9 10*3/uL — ABNORMAL HIGH (ref 3.4–10.8)

## 2019-02-05 LAB — COMPREHENSIVE METABOLIC PANEL
ALT: 14 IU/L (ref 0–32)
AST: 10 IU/L (ref 0–40)
Albumin/Globulin Ratio: 1.9 (ref 1.2–2.2)
Albumin: 4.1 g/dL (ref 3.8–4.8)
Alkaline Phosphatase: 74 IU/L (ref 39–117)
BUN/Creatinine Ratio: 14 (ref 12–28)
BUN: 14 mg/dL (ref 8–27)
Bilirubin Total: 0.4 mg/dL (ref 0.0–1.2)
CO2: 24 mmol/L (ref 20–29)
Calcium: 9.7 mg/dL (ref 8.7–10.3)
Chloride: 104 mmol/L (ref 96–106)
Creatinine, Ser: 1.03 mg/dL — ABNORMAL HIGH (ref 0.57–1.00)
GFR calc Af Amer: 67 mL/min/{1.73_m2} (ref >59)
GFR calc non Af Amer: 58 mL/min/{1.73_m2} — ABNORMAL LOW (ref >59)
Globulin, Total: 2.2 g/dL (ref 1.5–4.5)
Glucose: 102 mg/dL — ABNORMAL HIGH (ref 65–99)
Potassium: 5 mmol/L (ref 3.5–5.2)
Sodium: 142 mmol/L (ref 134–144)
Total Protein: 6.3 g/dL (ref 6.0–8.5)

## 2019-02-05 LAB — TSH: TSH: 0.512 u[IU]/mL (ref 0.450–4.500)

## 2019-02-07 NOTE — Progress Notes (Signed)
[facility]  [address] [place], [place] [address] Phone: [phone]  Fax: [phone]   Clinic Day:  02/10/2019  Referring physician: Letta Median, MD  Chief Complaint: Sheila Suarez is a 63 y.o. female with lupus s/p left upper lobe lobectomy who is seen for reassessment.  HPI: The patient was last seen in the hematology clinic on 09/18/2016.  At that time, she had persistent pain at the site where her chest tube was pulled.  Weight was up 3 pounds. She was to return to the clinic prn.   She has been followed for pain management by Dr. Vashti Hey, most recently on 12/18/2018. She has chronic back pain, for which she has received epidural steroid injections with significant improvement.  She was started on hydrocodone 5mg , taken twice daily, for the pain until she could be scheduled for another injection.  Injection is scheduled for 02/27/2019.   Abdomen and pelvis CT on 08/02/2018 revealed no acute findings.  There was a 1.5 cm lesion in the right lobe of the liver c/w an hemangioma.  Chest CT on 12/24/2018 revealed no acute findings s/p left upper lobe lobectomy.  There was a stable benign 14mm pulmonary nodule in the right upper lobe.  She has been followed by Dr. Flora Lipps, most recently on 12/26/2018, for chronic shortness of breath, COPD, and her history of left upper lobe nodule with left upper lobe resection. She continued on inhalers.   She has been followed in gastroenterology by Dr. [name], most recently on 02/04/2019 for chronic abdominal pain, IBS, and GERD. She had lost a significant amount of weight. She was referred back to the oncology clinic for discussion and further work-up.  Creatinine was 1.03.  LFTs were normal.  CBC revealed a hematocrit 42.7, hemoglobin 14.1, MCV 93, platelets 515,000, WBC 11,900 (ANC 8900; ALC 2100).  TSH was 0.512.  EGD on 05/31/2018 revealed normal examined duodenum, normal esophagus, and  gastritis. Pathology revealed moderate reactive gastropathy, negative for inflammation, H. pylori, metaplasia, dysplasia, or malignancy.  Colonoscopy on 06/02/2016 revealed no abnormal findings.  During the interim, she is doing "good." She is fatigued, and has minimal energy. She reports having H. pylori back in 2018, for which she recieved treatment. She has not felt right since, due to GERD and pain at her lung excision site. She reports she is not eating much at all.   She is 100 lbs today in the clinic today, down 20 lbs since 2018. She reports this occurred in the last few months. She has a very mild appetite, but when she goes to eat food, she no longer wants food. If she eats too much, she feels very bloated. She denies nausea, vomiting, or diarrhea. She denies chest pain or shortness of breath. She denies any joint pain or rashes.   She denies any history of blood transfusions. She declines HIV testing, stating that it is not possible she has HIV because she has not been sexually active in a long time. She is very interested in meeting with the nutritionist. She has not had a pelvic exam in several years.    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   Shortness of breath dyspnea    Sinus  infection    9/19    Past Surgical History:  Procedure Laterality Date   COLONOSCOPY WITH PROPOFOL N/A 06/02/2016   Procedure: COLONOSCOPY WITH PROPOFOL;  Surgeon: [name], MD;  Location: [facility] ENDOSCOPY;  Service: Gastroenterology;  Laterality: N/A;   CYST EXCISION Left    hand   ESOPHAGOGASTRODUODENOSCOPY (EGD) WITH PROPOFOL N/A 06/02/2016   Procedure: ESOPHAGOGASTRODUODENOSCOPY (EGD) WITH PROPOFOL;  Surgeon: [name], MD;  Location: [facility] ENDOSCOPY;  Service: Gastroenterology;  Laterality:  N/A;   ESOPHAGOGASTRODUODENOSCOPY (EGD) WITH PROPOFOL N/A 05/31/2018   Procedure: ESOPHAGOGASTRODUODENOSCOPY (EGD) WITH PROPOFOL;  Surgeon: [name], MD;  Location: [facility] ENDOSCOPY;  Service: Gastroenterology;  Laterality: N/A;   FLEXIBLE BRONCHOSCOPY N/A 04/17/2016   Procedure: FLEXIBLE BRONCHOSCOPY;  Surgeon: [name], MD;  Location: [facility] ORS;  Service: Thoracic;  Laterality: N/A;   HAND SURGERY Left    left upper lobectomy Left 03/2016   LUNG REMOVAL, PARTIAL Left    Left lower lobe   THORACOTOMY Left 04/17/2016   Procedure: THORACOTOMY LEFT UPPER LOBE LUNG RESECTION;  Surgeon: [name], MD;  Location: [facility] ORS;  Service: Thoracic;  Laterality: Left;   TUBAL LIGATION Bilateral    37 years ago    Family History  Problem Relation Age of Onset   Pancreatic cancer Sister    Lung cancer Sister    Lung cancer Brother    Stomach cancer Paternal Grandmother    Heart disease Father    Cancer Daughter 37       cervical   Cancer Maternal Aunt        brain   Cancer Maternal Uncle        prostate   Cancer Maternal Aunt        bone    Social History:  reports that she quit smoking about 2 years ago. Her smoking use included cigarettes. She has a 8.75 pack-year smoking history. She has never used smokeless tobacco. She reports current alcohol use. She reports that she does not use drugs. She has a 8.75 pack-year smoking history. She quit smokeless tobacco use about 6 months ago. She reports that she does not drink alcohol or use drugs.  She works in [employer] in [employer], [employer].  She lives in [place].  The patient is alone today.  Allergies: No Known Allergies  Current Medications: Current Outpatient Medications  Medication Sig Dispense Refill   albuterol (PROVENTIL HFA;VENTOLIN HFA) 108 (90 Base) MCG/ACT inhaler Inhale 1-2 puffs into the lungs every 4 (four) hours as needed for wheezing or shortness of breath. 1 Inhaler 12   ALPRAZolam (XANAX) 0.5 MG tablet  Take 1 tablet (0.5 mg total) by mouth 3 (three) times daily as needed for sleep or anxiety. 30 tablet 0   azithromycin (ZITHROMAX) 250 MG tablet   0   fluticasone (FLONASE) 50 MCG/ACT nasal spray Place into the nose.     gabapentin (NEURONTIN) 100 MG capsule Take 1 capsule (100 mg total) by mouth at bedtime. May take a second capsule at bedside if necessary 60 capsule 2   HYDROcodone-acetaminophen (NORCO/VICODIN) 5-325 MG tablet Take 1 tablet by mouth every 6 (six) hours as needed for up to 30 days for moderate pain or severe pain. 45 tablet 0   lactulose (CEPHULAC) 20 g packet Take 1 packet (20 g total) by mouth 3 (three) times daily. 30 each 0   lidocaine (LIDODERM) 5 % Place 1 patch onto the skin daily. Remove & Discard patch within 12 hours or as directed by MD 30  patch 0   linaclotide (LINZESS) 72 MCG capsule Take by mouth.     omeprazole (PRILOSEC) 40 MG capsule Take 1 capsule (40 mg total) by mouth daily. 90 capsule 3   predniSONE (DELTASONE) 20 MG tablet Take 1 tablet (20 mg total) by mouth daily. 5 tablet 0   ranitidine (ZANTAC) 150 MG tablet Take 150 mg by mouth daily.      sucralfate (CARAFATE) 1 g tablet Take 1 tablet (1 g total) by mouth 4 (four) times daily. 244 tablet 0   umeclidinium-vilanterol (ANORO ELLIPTA) 62.5-25 MCG/INH AEPB Inhale 1 puff into the lungs daily. 1 each 12   benzonatate (TESSALON) 200 MG capsule   0   cyclobenzaprine (FLEXERIL) 5 MG tablet Take 1-2 tablets 3 times daily as needed (Patient not taking: Reported on 02/04/2019) 20 tablet 0   guaiFENesin-codeine 100-10 MG/5ML syrup Take by mouth.     omeprazole (PRILOSEC) 40 MG capsule Take 1 capsule (40 mg total) by mouth daily for 90 doses. (Patient not taking: Reported on 02/10/2019) 90 capsule 0   ondansetron (ZOFRAN) 4 MG tablet Take 4 mg by mouth as needed.      polyethylene glycol powder (GLYCOLAX/MIRALAX) powder Take 255 g by mouth daily. (Patient not taking: Reported on 05/31/2018) 255 g 3    predniSONE (DELTASONE) 20 MG tablet Take 1 tablet (20 mg total) by mouth daily with breakfast. 10 days (Patient not taking: Reported on 02/10/2019) 10 tablet 0   No current facility-administered medications for this visit.     Review of Systems  Constitutional: Positive for malaise/fatigue and weight loss (11 lbs since 06/2018). Negative for chills, diaphoresis and fever.  HENT: Negative.  Negative for congestion, hearing loss, nosebleeds, sinus pain and sore throat.   Eyes: Negative for blurred vision.       Poor vision (near and far).  Needs glasses.   Respiratory: Negative for cough, shortness of breath and wheezing.   Cardiovascular: Positive for chest pain (at site of chest tube pull). Negative for palpitations, orthopnea, leg swelling and PND.  Gastrointestinal: Negative.  Negative for abdominal pain, blood in stool, constipation, diarrhea, melena, nausea and vomiting.       No appetite.  Genitourinary: Negative.  Negative for dysuria, frequency, hematuria and urgency.  Musculoskeletal: Negative.  Negative for back pain, joint pain and myalgias.  Skin: Negative.  Negative for rash.  Neurological: Negative.  Negative for dizziness, tingling, speech change, weakness and headaches.  Endo/Heme/Allergies: Negative.  Does not bruise/bleed easily.  Psychiatric/Behavioral: Negative for depression, memory loss and substance abuse. The patient is not nervous/anxious and does not have insomnia.   All other systems reviewed and are negative.  Performance status (ECOG): 1  Vitals Blood pressure 130/88, pulse 67, temperature 97.6 F (36.4 C), temperature source Tympanic, resp. rate 18, height 5\' 5"  (1.651 m), weight 101 lb 6.6 oz (46 kg), SpO2 100 %.   Physical Exam  Constitutional: She is oriented to person, place, and time. She appears well-developed and well-nourished. No distress.  Thin woman sitting in the exam room in no acute distress.  HENT:  Head: Normocephalic and atraumatic.    Mouth/Throat: Oropharynx is clear and moist. No oropharyngeal exudate.  Wearing a hat and mask.  Brown hair with graying.  Eyes: Pupils are equal, round, and reactive to light. Conjunctivae and EOM are normal. No scleral icterus.  Glasses. Brown eyes.   Neck: Normal range of motion. Neck supple. No JVD present.  Cardiovascular: Normal rate, regular rhythm and normal  heart sounds.  No murmur heard. Pulmonary/Chest: Effort normal and breath sounds normal. No respiratory distress. She has no wheezes. She has no rales.  Abdominal: Soft. Bowel sounds are normal. She exhibits no distension and no mass. There is no splenomegaly or hepatomegaly. There is no abdominal tenderness. There is no rebound and no guarding.  Musculoskeletal: Normal range of motion.        General: No tenderness or edema.  Lymphadenopathy:    She has no cervical adenopathy.    She has no axillary adenopathy.       Right: No inguinal and no supraclavicular adenopathy present.       Left: No inguinal and no supraclavicular adenopathy present.  Neurological: She is alert and oriented to person, place, and time.  Skin: Skin is warm and dry. No rash noted. She is not diaphoretic. No erythema. No pallor.  Psychiatric: She has a normal mood and affect. Her behavior is normal. Judgment and thought content normal.  Nursing note and vitals reviewed.   No visits with results within 3 Day(s) from this visit.  Latest known visit with results is:  Office Visit on 02/04/2019  Component Date Value Ref Range Status   WBC 02/04/2019 11.9* 3.4 - 10.8 x10E3/uL Final   RBC 02/04/2019 4.59  3.77 - 5.28 x10E6/uL Final   Hemoglobin 02/04/2019 14.1  11.1 - 15.9 g/dL Final   Hematocrit 02/04/2019 42.7  34.0 - 46.6 % Final   MCV 02/04/2019 93  79 - 97 fL Final   MCH 02/04/2019 30.7  26.6 - 33.0 pg Final   MCHC 02/04/2019 33.0  31.5 - 35.7 g/dL Final   RDW 02/04/2019 12.0  11.7 - 15.4 % Final   Platelets 02/04/2019 515* 150 - 450  x10E3/uL Final   Neutrophils 02/04/2019 75  Not Estab. % Final   Lymphs 02/04/2019 18  Not Estab. % Final   Monocytes 02/04/2019 7  Not Estab. % Final   Eos 02/04/2019 0  Not Estab. % Final   Basos 02/04/2019 0  Not Estab. % Final   Neutrophils Absolute 02/04/2019 8.9* 1.4 - 7.0 x10E3/uL Final   Lymphocytes Absolute 02/04/2019 2.1  0.7 - 3.1 x10E3/uL Final   Monocytes Absolute 02/04/2019 0.8  0.1 - 0.9 x10E3/uL Final   EOS (ABSOLUTE) 02/04/2019 0.0  0.0 - 0.4 x10E3/uL Final   Basophils Absolute 02/04/2019 0.0  0.0 - 0.2 x10E3/uL Final   Immature Granulocytes 02/04/2019 0  Not Estab. % Final   Immature Grans (Abs) 02/04/2019 0.0  0.0 - 0.1 x10E3/uL Final   Glucose 02/04/2019 102* 65 - 99 mg/dL Final   BUN 02/04/2019 14  8 - 27 mg/dL Final   Creatinine, Ser 02/04/2019 1.03* 0.57 - 1.00 mg/dL Final   GFR calc non Af Amer 02/04/2019 58* >59 mL/min/1.73 Final   GFR calc Af Amer 02/04/2019 67  >59 mL/min/1.73 Final   BUN/Creatinine Ratio 02/04/2019 14  12 - 28 Final   Sodium 02/04/2019 142  134 - 144 mmol/L Final   Potassium 02/04/2019 5.0  3.5 - 5.2 mmol/L Final   Chloride 02/04/2019 104  96 - 106 mmol/L Final   CO2 02/04/2019 24  20 - 29 mmol/L Final   Calcium 02/04/2019 9.7  8.7 - 10.3 mg/dL Final   Total Protein 02/04/2019 6.3  6.0 - 8.5 g/dL Final   Albumin 02/04/2019 4.1  3.8 - 4.8 g/dL Final   Globulin, Total 02/04/2019 2.2  1.5 - 4.5 g/dL Final   Albumin/Globulin Ratio 02/04/2019 1.9  1.2 - 2.2 Final   Bilirubin Total 02/04/2019 0.4  0.0 - 1.2 mg/dL Final   Alkaline Phosphatase 02/04/2019 74  39 - 117 IU/L Final   AST 02/04/2019 10  0 - 40 IU/L Final   ALT 02/04/2019 14  0 - 32 IU/L Final   TSH 02/04/2019 0.512  0.450 - 4.500 uIU/mL Final    Assessment:  Yarelie Hams is a 63 y.o. female with lupus x 8 years.  CT angiogram of the chest, abdominal and pelvis on 02/17/2016 revealed a 1.1 cm spiculated lesion in the left upper lobe increased from  prior exam (08/30/2010). There were stable small pulmonary nodules bilaterally (2-3 mm).  She has a 15 pack year smoking history.  PET scan on 02/15/2016 revealed an irregular 1.0 cm apical LUL pulmonary nodule (SUV 0.7).  There was a 4 mm right middle lobe solid pulmonary nodule (stable since 08/02/2010) and two additional right lower lobe 3 mm pulmonary nodules.  PFTs revealed an FEV1 was 84% and her DLCO was 67%.  She underwent left thoracotomy with left upper lobe lobectomy on 04/17/2016.  Pathology revealed subpleural fibro-elastotic scar consistent with benign pulmonary cap.  There was no evidence of malignancy.  There were 2 benign lymph nodes.  She had a pneumothorax requiring chest tube placement.  She has received the series of rabies injections after being bitten by a stray dog.  EGD and colonoscopy were normal on 06/02/2016.  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  There was no dysplasia or malignancy.  She was treated for H pylori beginning 06/17/2016.  EGD on 05/31/2018 revealed normal examined duodenum, normal esophagus, and gastritis. Pathology revealed moderate reactive gastropathy, negative for inflammation, H. pylori, metaplasia, dysplasia, or malignancy.  Colonoscopy on 06/02/2016 revealed no abnormal findings.  Abdomen and pelvis CT on 08/02/2018 revealed no acute findings.  There was a 1.5 cm lesion in the right lobe of the liver c/w an hemangioma.  Chest CT on 12/24/2018 revealed no acute findings s/p left upper lobe lobectomy.  There was a stable benign 47mm pulmonary nodule in the right upper lobe.  Labs on 02/04/2019 included: hematocrit 42.7, hemoglobin 14.1, MCV 93, platelets 515,000, WBC 11,900 (ANC 8900; ALC 2100).  TSH was 0.512.  Normal studies included:  Creatinine (1.030) and LFTs.  Symptomatically, she has lost 20 pounds since 2018 (11 pounds since 06/2018).  She is easily bloated.  She has minimal appetite.  Exam reveals  no adenopathy or hepatosplenomegaly.  Plan: 1.   Labs today: CBC with diff, free T4, LDH, sed rate, CRP, urinalysis.  2.   Pathologist smear review. 3.   Unexplained weight loss  Etiology unclear.  Chest CT (12/24/2018) and abdomen and pelvis CT (08/02/2018) revealed no acute findings.  EGD on 05/31/2018 revealed no evidence of malignancy.  Colonoscopy on 06/02/2016 revealed no evidence of malignancy.  TSH was normal on 02/04/2019.  Check free T4.  Additional labs:  HIV testing (pateint declines)  Schedule screening mammogram.  Consider pelvic exam (none in several years).  Discuss diet.  Discuss referral to nutrition.  Patient in agreement.  If weight loss persists without explanation, consider PET scan. 4.   RTC in 2 weeks for MD assessment and review of work-up.      I discussed the assessment and treatment plan with the patient.  The patient was provided an opportunity to ask questions and all were answered.  The patient agreed with the plan and demonstrated an understanding of the instructions.  The patient was advised to call back if the symptoms worsen or if the condition fails to improve as anticipated.   Lequita Asal, MD, PhD    02/10/2019, 2:28 PM  I, Molly Dorshimer, am acting as Education administrator for [employer]. Mike Gip, MD, PhD.  I, Sumedh Shinsato C. Mike Gip, MD, have reviewed the above documentation for accuracy and completeness, and I agree with the above.

## 2019-02-10 ENCOUNTER — Inpatient Hospital Stay: Payer: Medicare Other

## 2019-02-10 ENCOUNTER — Other Ambulatory Visit: Payer: Self-pay

## 2019-02-10 ENCOUNTER — Inpatient Hospital Stay: Payer: Medicare Other | Attending: Hematology and Oncology | Admitting: Hematology and Oncology

## 2019-02-10 ENCOUNTER — Encounter: Payer: Self-pay | Admitting: Hematology and Oncology

## 2019-02-10 VITALS — BP 130/88 | HR 67 | Temp 97.6°F | Resp 18 | Ht 65.0 in | Wt 101.4 lb

## 2019-02-10 DIAGNOSIS — D6862 Lupus anticoagulant syndrome: Secondary | ICD-10-CM

## 2019-02-10 DIAGNOSIS — R140 Abdominal distension (gaseous): Secondary | ICD-10-CM

## 2019-02-10 DIAGNOSIS — Z902 Acquired absence of lung [part of]: Secondary | ICD-10-CM | POA: Diagnosis not present

## 2019-02-10 DIAGNOSIS — J449 Chronic obstructive pulmonary disease, unspecified: Secondary | ICD-10-CM | POA: Diagnosis not present

## 2019-02-10 DIAGNOSIS — D72829 Elevated white blood cell count, unspecified: Secondary | ICD-10-CM

## 2019-02-10 DIAGNOSIS — K219 Gastro-esophageal reflux disease without esophagitis: Secondary | ICD-10-CM | POA: Insufficient documentation

## 2019-02-10 DIAGNOSIS — R634 Abnormal weight loss: Secondary | ICD-10-CM | POA: Diagnosis not present

## 2019-02-10 DIAGNOSIS — Z87891 Personal history of nicotine dependence: Secondary | ICD-10-CM | POA: Diagnosis not present

## 2019-02-10 DIAGNOSIS — R911 Solitary pulmonary nodule: Secondary | ICD-10-CM | POA: Insufficient documentation

## 2019-02-10 LAB — CBC WITH DIFFERENTIAL/PLATELET
Abs Immature Granulocytes: 0.03 10*3/uL (ref 0.00–0.07)
Basophils Absolute: 0 10*3/uL (ref 0.0–0.1)
Basophils Relative: 0 %
Eosinophils Absolute: 0 10*3/uL (ref 0.0–0.5)
Eosinophils Relative: 0 %
HCT: 44.8 % (ref 36.0–46.0)
Hemoglobin: 14.4 g/dL (ref 12.0–15.0)
Immature Granulocytes: 0 %
Lymphocytes Relative: 22 %
Lymphs Abs: 2.1 10*3/uL (ref 0.7–4.0)
MCH: 31.3 pg (ref 26.0–34.0)
MCHC: 32.1 g/dL (ref 30.0–36.0)
MCV: 97.4 fL (ref 80.0–100.0)
Monocytes Absolute: 0.7 10*3/uL (ref 0.1–1.0)
Monocytes Relative: 7 %
Neutro Abs: 6.8 10*3/uL (ref 1.7–7.7)
Neutrophils Relative %: 71 %
Platelets: 421 10*3/uL — ABNORMAL HIGH (ref 150–400)
RBC: 4.6 MIL/uL (ref 3.87–5.11)
RDW: 13.6 % (ref 11.5–15.5)
WBC: 9.7 10*3/uL (ref 4.0–10.5)
nRBC: 0 % (ref 0.0–0.2)

## 2019-02-10 LAB — URINALYSIS, COMPLETE (UACMP) WITH MICROSCOPIC
Glucose, UA: NEGATIVE mg/dL
Hgb urine dipstick: NEGATIVE
Ketones, ur: NEGATIVE mg/dL
Leukocytes,Ua: NEGATIVE
Nitrite: NEGATIVE
Protein, ur: NEGATIVE mg/dL
Specific Gravity, Urine: >1.03 — ABNORMAL HIGH (ref 1.005–1.030)
pH: 5.5 (ref 5.0–8.0)

## 2019-02-10 LAB — LACTATE DEHYDROGENASE: LDH: 135 U/L (ref 98–192)

## 2019-02-10 LAB — T4, FREE: Free T4: 0.75 ng/dL (ref 0.61–1.12)

## 2019-02-10 LAB — SEDIMENTATION RATE: Sed Rate: 1 mm/hr (ref 0–30)

## 2019-02-10 LAB — C-REACTIVE PROTEIN: CRP: <0.8 mg/dL (ref <1.0)

## 2019-02-10 NOTE — Progress Notes (Signed)
No new changes noted today 

## 2019-02-11 LAB — PATHOLOGIST SMEAR REVIEW

## 2019-02-23 ENCOUNTER — Encounter: Payer: Self-pay | Admitting: Gastroenterology

## 2019-02-24 ENCOUNTER — Ambulatory Visit: Payer: Medicare Other | Admitting: Hematology and Oncology

## 2019-02-24 DIAGNOSIS — I2510 Atherosclerotic heart disease of native coronary artery without angina pectoris: Secondary | ICD-10-CM | POA: Diagnosis not present

## 2019-02-24 DIAGNOSIS — Z87891 Personal history of nicotine dependence: Secondary | ICD-10-CM | POA: Diagnosis not present

## 2019-02-24 DIAGNOSIS — R0602 Shortness of breath: Secondary | ICD-10-CM | POA: Diagnosis not present

## 2019-02-24 NOTE — Progress Notes (Signed)
[facility]  [address], [address] [place], [place] [address] Phone: [phone]  Fax: [phone]   Clinic Day:  02/26/2019  Referring physician: Letta Median, MD  Chief Complaint: Sheila Suarez is a 63 y.o. female with lupus s/p left upper lobe lobectomy (benign) and weight loss who is seen for review pf work-up and discussion regarding direction of therapy.  HPI: The patient was last seen in the hematology clinic on 02/10/2019. At that time, she had lost 20 pounds since 2018 (11 pounds since 06/2018).  She was easily bloated. She had minimal appetite. Exam revealed no adenopathy or hepatosplenomegaly.  Work-up showed: hematocrit 44.8, hemoglobin 14.4, MCV 97.4, platelets 421,000, WBC 9,700. LDH 135, sed rate 1, CRP <0.8. Free T4 0.75. Urinalysis was negative.  Peripheral smear showed high normal leukocyte count with unremarkable morphology, normochromic, normocytic erythrocytes, and mild thrombocytosis with unremarkable morphology.   During the interim, she "doesn't feel good." She notes being off balance for the past few months.  She has been fatigued. She attributes this to losing weight. She notes chronic pain at the site of her previous lung surgery in her left upper abdomen.   She has been drinking Ensure supplemental drinks twice daily. Weight is up 3lbs in the clinic today. She reports continued lack of appetite. Occasionally she is hungry and then when she goes to eat loses her appetite completely.   She has an appointment with the nutritionist tomorrow.    Past Medical History:  Diagnosis Date  . Cancer ([place])    lung  . Coronary artery disease   . Emphysema of lung ([place])   . GERD (gastroesophageal reflux disease)   . Lupus ([place])   . 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 Shortness of breath dyspnea   . Sinus infection    9/19    Past Surgical History:  Procedure Laterality Date  . COLONOSCOPY WITH  PROPOFOL N/A 06/02/2016   Procedure: COLONOSCOPY WITH PROPOFOL;  Surgeon: [name], MD;  Location: [facility] ENDOSCOPY;  Service: Gastroenterology;  Laterality: N/A;  . CYST EXCISION Left    hand  . ESOPHAGOGASTRODUODENOSCOPY (EGD) WITH PROPOFOL N/A 06/02/2016   Procedure: ESOPHAGOGASTRODUODENOSCOPY (EGD) WITH PROPOFOL;  Surgeon: [name], MD;  Location: [facility] ENDOSCOPY;  Service: Gastroenterology;  Laterality: N/A;  . ESOPHAGOGASTRODUODENOSCOPY (EGD) WITH PROPOFOL N/A 05/31/2018   Procedure: ESOPHAGOGASTRODUODENOSCOPY (EGD) WITH PROPOFOL;  Surgeon: [name], MD;  Location: [facility] ENDOSCOPY;  Service: Gastroenterology;  Laterality: N/A;  . FLEXIBLE BRONCHOSCOPY N/A 04/17/2016   Procedure: FLEXIBLE BRONCHOSCOPY;  Surgeon: [name], MD;  Location: [facility] ORS;  Service: Thoracic;  Laterality: N/A;  . HAND SURGERY Left   . left upper lobectomy Left 03/2016  . LUNG REMOVAL, PARTIAL Left    Left lower lobe  . THORACOTOMY Left 04/17/2016   Procedure: THORACOTOMY LEFT UPPER LOBE LUNG RESECTION;  Surgeon: [name], MD;  Location: [facility] ORS;  Service: Thoracic;  Laterality: Left;  . TUBAL LIGATION Bilateral    37 years ago    Family History  Problem Relation Age of Onset  . Pancreatic cancer Sister   . Lung cancer Sister   . Lung cancer Brother   . Stomach cancer Paternal Grandmother   . Heart disease Father   . Cancer Daughter 39       cervical  . Cancer Maternal Aunt        brain  . Cancer Maternal Uncle        prostate  . Cancer  Maternal Aunt        bone    Social History:  reports that she quit smoking about 2 years ago. Her smoking use included cigarettes. She has a 8.75 pack-year smoking history. She has never used smokeless tobacco. She reports current alcohol use. She reports that she does not use drugs. She has a 8.75 pack-year smoking history. She quit smokeless tobacco use about 6 months ago. She reports that she does not drink alcohol or use drugs.She works in [employer] in [employer]. She lives in [place].The patient is alone today.  Allergies: No Known Allergies  Current Medications: Current Outpatient Medications  Medication Sig Dispense Refill  . albuterol (PROVENTIL HFA;VENTOLIN HFA) 108 (90 Base) MCG/ACT inhaler Inhale 1-2 puffs into the lungs every 4 (four) hours as needed for wheezing or shortness of breath. 1 Inhaler 12  . ALPRAZolam (XANAX) 0.5 MG tablet Take 1 tablet (0.5 mg total) by mouth 3 (three) times daily as needed for sleep or anxiety. 30 tablet 0  . cyclobenzaprine (FLEXERIL) 5 MG tablet Take 1-2 tablets 3 times daily as needed 20 tablet 0  . gabapentin (NEURONTIN) 100 MG capsule Take 1 capsule (100 mg total) by mouth at bedtime. May take a second capsule at bedside if necessary 60 capsule 2  . lactulose (CEPHULAC) 20 g packet Take 1 packet (20 g total) by mouth 3 (three) times daily. 30 each 0  . lidocaine (LIDODERM) 5 % Place 1 patch onto the skin daily. Remove & Discard patch within 12 hours or as directed by MD 30 patch 0  . linaclotide (LINZESS) 72 MCG capsule Take 72 mcg by mouth daily before breakfast.     . omeprazole (PRILOSEC) 40 MG capsule Take 1 capsule (40 mg total) by mouth daily for 90 doses. 90 capsule 0  . sucralfate (CARAFATE) 1 g tablet Take 1 tablet (1 g total) by mouth 4 (four) times daily. 244 tablet 0  . umeclidinium-vilanterol (ANORO ELLIPTA) 62.5-25 MCG/INH AEPB Inhale 1 puff into the lungs daily. 1 each 12   No current facility-administered medications for this visit.     Review of Systems  Constitutional: Positive for malaise/fatigue. Negative for chills, diaphoresis, fever and weight loss (up 3lbs).       Doesn't feel good.  HENT: Negative.  Negative for congestion, hearing loss, nosebleeds, sinus pain and sore throat.   Eyes: Negative for blurred vision.       Poor vision (near and far).  Needs glasses.   Respiratory: Negative for cough, shortness of breath and wheezing.        S/p left upper lobe  lobectomy.  Cardiovascular: Negative for chest pain, palpitations, orthopnea, leg swelling and PND.  Gastrointestinal: Positive for abdominal pain (LUQ). Negative for blood in stool, constipation, diarrhea, melena, nausea and vomiting.       No appetite.  Genitourinary: Negative.  Negative for dysuria, frequency, hematuria and urgency.  Musculoskeletal: Negative.  Negative for back pain, joint pain and myalgias.  Skin: Negative.  Negative for rash.  Neurological: Positive for sensory change (balance issues) and weakness (generalized). Negative for dizziness, tingling, speech change and headaches.  Endo/Heme/Allergies: Negative.  Does not bruise/bleed easily.  Psychiatric/Behavioral: Negative for depression, memory loss and substance abuse. The patient is not nervous/anxious and does not have insomnia.   All other systems reviewed and are negative.  Performance status (ECOG): 1  Vitals Blood pressure 128/76, pulse 60, temperature (!) 96.9 F (36.1 C), temperature source Tympanic, resp. rate 18,  weight 104 lb 13.3 oz (47.6 kg), SpO2 100 %.   Physical Exam  Constitutional: She is oriented to person, place, and time. She appears well-developed and well-nourished. No distress.  Thin woman sitting in the exam room in no acute distress.  HENT:  Head: Normocephalic and atraumatic.  Mouth/Throat: Oropharynx is clear and moist. No oropharyngeal exudate.  Wearing a scarf and mask.  Brown hair with graying.  Eyes: Pupils are equal, round, and reactive to light. Conjunctivae and EOM are normal. No scleral icterus.  Glasses. Brown eyes.   Neck: Normal range of motion. Neck supple. No JVD present.  Cardiovascular: Normal rate, regular rhythm and normal heart sounds.  No murmur heard. Pulmonary/Chest: Effort normal and breath sounds normal. No respiratory distress. She has no wheezes. She has no rales.  Abdominal: Soft. Bowel sounds are normal. She exhibits no distension and no mass. There is no  splenomegaly or hepatomegaly. There is no abdominal tenderness. There is no rebound and no guarding.  Musculoskeletal:        General: No edema.  Neurological: She is alert and oriented to person, place, and time.  Skin: Skin is warm and dry. No rash noted. She is not diaphoretic. No erythema. No pallor.  Psychiatric: She has a normal mood and affect. Her behavior is normal. Judgment and thought content normal.  Nursing note and vitals reviewed.   No visits with results within 3 Day(s) from this visit.  Latest known visit with results is:  Office Visit on 02/10/2019  Component Date Value Ref Range Status  . CRP 02/10/2019 <0.8  <1.0 mg/dL Final   Performed at [facility] [address] [place], [place] [address]  . Sed Rate 02/10/2019 1  0 - 30 mm/hr Final   Performed at [facility], [address], [place], [place] [address]  . LDH 02/10/2019 135  98 - 192 U/L Final   Performed at [facility], [address], [place], [place] [address]  . Path Review 02/10/2019 Peripheral blood smear is reviewed.   Final   Comment: High normal leukocyte count with unremarkable morphology. Normochromic, normocytic erythrocytes. Mild thrombocytosis with unremarkable morphology.  The level of thrombocytosis is below that typically associated with certain myeloproliferative entities, usually associated with a persistent elevation over 450K per uL for a period of greater than 6 months. No other significant morphologic abnormalities are noted.  Clinical correlation is recommended. Reviewed by Kathi Simpers, M.D. Performed at [facility], [address] [place], [place] [address]   . WBC 02/10/2019 9.7  4.0 - 10.5 K/uL Final  . RBC 02/10/2019 4.60  3.87 - 5.11 MIL/uL Final  . Hemoglobin 02/10/2019 14.4  12.0 - 15.0 g/dL Final  . HCT 02/10/2019 44.8  36.0 - 46.0 % Final  . MCV 02/10/2019 97.4  80.0 - 100.0 fL Final  . MCH 02/10/2019 31.3  26.0 - 34.0 pg  Final  . MCHC 02/10/2019 32.1  30.0 - 36.0 g/dL Final  . RDW 02/10/2019 13.6  11.5 - 15.5 % Final  . Platelets 02/10/2019 421* 150 - 400 K/uL Final  . nRBC 02/10/2019 0.0  0.0 - 0.2 % Final  . Neutrophils Relative % 02/10/2019 71  % Final  . Neutro Abs 02/10/2019 6.8  1.7 - 7.7 K/uL Final  . Lymphocytes Relative 02/10/2019 22  % Final  . Lymphs Abs 02/10/2019 2.1  0.7 - 4.0 K/uL Final  . Monocytes Relative 02/10/2019 7  % Final  .  Monocytes Absolute 02/10/2019 0.7  0.1 - 1.0 K/uL Final  . Eosinophils Relative 02/10/2019 0  % Final  . Eosinophils Absolute 02/10/2019 0.0  0.0 - 0.5 K/uL Final  . Basophils Relative 02/10/2019 0  % Final  . Basophils Absolute 02/10/2019 0.0  0.0 - 0.1 K/uL Final  . Immature Granulocytes 02/10/2019 0  % Final  . Abs Immature Granulocytes 02/10/2019 0.03  0.00 - 0.07 K/uL Final   Performed at [facility], [address] [place], [place] [address]  . Free T4 02/10/2019 0.75  0.61 - 1.12 ng/dL Final   Comment: (NOTE) Biotin ingestion may interfere with free T4 tests. If the results are inconsistent with the TSH level, previous test results, or the clinical presentation, then consider biotin interference. If needed, order repeat testing after stopping biotin. Performed at [facility], [address], [place], [place] [address]   . Color, Urine 02/10/2019 YELLOW  YELLOW Final  . APPearance 02/10/2019 CLEAR  CLEAR Final  . Specific Gravity, Urine 02/10/2019 >1.030* 1.005 - 1.030 Final  . pH 02/10/2019 5.5  5.0 - 8.0 Final  . Glucose, UA 02/10/2019 NEGATIVE  NEGATIVE mg/dL Final  . Hgb urine dipstick 02/10/2019 NEGATIVE  NEGATIVE Final  . Bilirubin Urine 02/10/2019 SMALL* NEGATIVE Final  . Ketones, ur 02/10/2019 NEGATIVE  NEGATIVE mg/dL Final  . Protein, ur 02/10/2019 NEGATIVE  NEGATIVE mg/dL Final  . Nitrite 02/10/2019 NEGATIVE  NEGATIVE Final  . Chalmers Guest 02/10/2019 NEGATIVE  NEGATIVE Final  . Squamous Epithelial / LPF  02/10/2019 0-5  0 - 5 Final  . WBC, UA 02/10/2019 0-5  0 - 5 WBC/hpf Final  . RBC / HPF 02/10/2019 0-5  0 - 5 RBC/hpf Final  . Bacteria, UA 02/10/2019 FEW* NONE SEEN Final  . Mucus 02/10/2019 PRESENT   Final   Performed at [facility], [address], [place],  [address]    Assessment:  Sheila Suarez is a 63 y.o. female withlupusx 8 years. CT angiogram of the chest, abdominal and pelvis on 02/17/2016 revealed a 1.1 cm spiculated lesion in the left upper lobeincreased from prior exam (08/30/2010). There were stable small pulmonary nodules bilaterally (2-3 mm). She has a 15 pack year smoking history.  PET scanon 02/15/2016 revealed an irregular 1.0 cm apical LUL pulmonary nodule (SUV 0.7). There was a 4 mm right middle lobe solid pulmonary nodule (stable since 08/02/2010) and two additional right lower lobe 3 mm pulmonary nodules. PFTs revealed an FEV1 was 84% and her DLCO was 67%.  She underwent left thoracotomy with left upper lobe lobectomyon 04/17/2016. Pathologyrevealed subpleural fibro-elastotic scarconsistent with benign pulmonary cap. There was no evidence of malignancy. There were 2 benign lymph nodes. She had a pneumothorax requiring chest tube placement.  She has received the series of rabies injectionsafter being bitten by a stray dog. EGD and colonoscopywere normal on 06/02/2016. 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 There was no dysplasia or malignancy. She was treated for H pylori beginning 06/17/2016.  EGD on 05/31/2018 revealed normal examined duodenum, normal esophagus, and gastritis. Pathology revealed moderate reactive gastropathy, negative for inflammation, H. pylori, metaplasia, dysplasia, or malignancy.  Colonoscopy on 06/02/2016 revealed no abnormal findings.  Abdomen and pelvis CT on 08/02/2018 revealed no acute findings.  There was a 1.5 cm lesion in the right lobe of the liver c/w  an hemangioma.  Chest CT on 12/24/2018 revealed no acute findings s/p left upper lobe lobectomy.  There was a stable benign  42mm pulmonary nodule in the right upper lobe.  Labs on 02/04/2019 included: hematocrit 42.7, hemoglobin 14.1, MCV 93, platelets 515,000, WBC 11,900 (ANC 8900; ALC 2100).  TSH was 0.512.  Normal studies included:  Creatinine (1.030) and LFTs.  Work-up on 02/10/2019: hematocrit 44.8, hemoglobin 14.4, MCV 97.4, platelets 421,000, WBC 9,700. LDH 135, sed rate 1, CRP <0.8. Free T4 0.75.  Urinalysis was negative.  Peripheral smear showed high normal leukocyte count with unremarkable morphology, normochromic, normocytic erythrocytes, and mild thrombocytosis with unremarkable morphology.   Symptomatically, she is fatigued.  She has early satiety.  She has been drinking Ensure.  Weight is up 3 pounds.  Plan: 1.   Review workup from 02/10/2019.  2.   Unexplained weight loss             Etiology remains unclear.             Chest CT (12/24/2018) and abdomen and pelvis CT (08/02/2018) revealed no acute findings.             EGD on 05/31/2018 revealed no evidence of malignancy.             Colonoscopy on 06/02/2016 revealed no evidence of malignancy.             TSH was normal on 02/04/2019.  Free T4 was normal on 02/10/2019.             Patient declined HIV testing.             Lupus appears quiescent.  Encourage screening mammogram (oder in place) and pelvic exam (none in years).             Discuss diet and plan to follow-up with nutrition.             Discuss PET scan to r/o malignancy. 3.   Schedule PET scan. 4.   RTC in 1 month for MD assessment, labs (CBC with diff, CMP) and review of PET scan.  I discussed the assessment and treatment plan with the patient.  The patient was provided an opportunity to ask questions and all were answered.  The patient agreed with the plan and demonstrated an understanding of the instructions.  The patient was advised to call back if the  symptoms worsen or if the condition fails to improve as anticipated.  I provided 15 minutes of face-to-face time during this this encounter and > 50% was spent counseling as documented under my assessment and plan.    Lequita Asal, MD, PhD    02/26/2019, 9:00 AM  I, Molly Dorshimer, am acting as Education administrator for [employer]. Mike Gip, MD, PhD.  I, Melissa C. Mike Gip, MD, have reviewed the above documentation for accuracy and completeness, and I agree with the above.

## 2019-02-26 ENCOUNTER — Inpatient Hospital Stay (HOSPITAL_BASED_OUTPATIENT_CLINIC_OR_DEPARTMENT_OTHER): Payer: Medicare Other | Admitting: Hematology and Oncology

## 2019-02-26 ENCOUNTER — Encounter: Payer: Self-pay | Admitting: Hematology and Oncology

## 2019-02-26 ENCOUNTER — Other Ambulatory Visit: Payer: Self-pay

## 2019-02-26 VITALS — BP 128/76 | HR 60 | Temp 96.9°F | Resp 18 | Wt 104.8 lb

## 2019-02-26 DIAGNOSIS — R634 Abnormal weight loss: Secondary | ICD-10-CM | POA: Diagnosis not present

## 2019-02-26 DIAGNOSIS — K219 Gastro-esophageal reflux disease without esophagitis: Secondary | ICD-10-CM | POA: Diagnosis not present

## 2019-02-26 DIAGNOSIS — R911 Solitary pulmonary nodule: Secondary | ICD-10-CM | POA: Diagnosis not present

## 2019-02-26 DIAGNOSIS — D72829 Elevated white blood cell count, unspecified: Secondary | ICD-10-CM

## 2019-02-26 DIAGNOSIS — J449 Chronic obstructive pulmonary disease, unspecified: Secondary | ICD-10-CM | POA: Diagnosis not present

## 2019-02-26 DIAGNOSIS — Z902 Acquired absence of lung [part of]: Secondary | ICD-10-CM | POA: Diagnosis not present

## 2019-02-26 DIAGNOSIS — R140 Abdominal distension (gaseous): Secondary | ICD-10-CM | POA: Diagnosis not present

## 2019-02-26 MED ORDER — MEGESTROL ACETATE 400 MG/10ML PO SUSP: 200.0000 mg | Freq: Every day | ORAL | 0 refills | Status: DC

## 2019-02-26 NOTE — Progress Notes (Signed)
Pt here for follow up. Reports muscles spasms are present in right lower leg x "a little over a month". Denies any concerns.

## 2019-02-26 NOTE — Patient Instructions (Signed)
Megestrol oral suspension What is this medicine? MEGESTROL (me JES trol) suspension improves appetite and helps cause weight gain. It is used in conditions that cause significant loss of appetite and weight, such as AIDS. This medicine may be used for other purposes; ask your health care provider or pharmacist if you have questions. COMMON BRAND NAME(S): Megace, Megace ES What should I tell my health care provider before I take this medicine? They need to know if you have any of these conditions:  adrenal gland problems  history of blood clots of the legs, lungs, or other parts of the body  diabetes  kidney disease  liver disease  stroke  an unusual or allergic reaction to megestrol, other medicines, foods, dyes, or preservatives  pregnant or trying to get pregnant  breast-feeding How should I use this medicine? Take this medicine by mouth. Follow the directions on the prescription label. Shake well before using. Use a specially marked spoon or container to measure your medicine. Ask your pharmacist if you do not have one. Household spoons are not accurate. Take your medicine at regular intervals. Do not take your medicine more often than directed. Do not stop taking except on your doctor's advice. Contact your pediatrician or health care provider regarding the use of this medicine in children. Special care may be needed. Overdosage: If you think you have taken too much of this medicine contact a poison control center or emergency room at once. NOTE: This medicine is only for you. Do not share this medicine with others. What if I miss a dose? If you miss or forget a single dose, continue with your next regularly scheduled dose on the next day. Do not take double or extra doses. What may interact with this medicine? Do not take this medicine with any of the following medications:  dofetilide This medicine may also interact with the following medications:  indinavir This list may  not describe all possible interactions. Give your health care provider a list of all the medicines, herbs, non-prescription drugs, or dietary supplements you use. Also tell them if you smoke, drink alcohol, or use illegal drugs. Some items may interact with your medicine. What should I watch for while using this medicine? Visit your doctor or health care professional for regular checks on your progress. It may take up to 3 weeks to first notice an improved appetite. It may take up to 12 weeks to notice weight gain. This medicine can cause birth defects. Do not get pregnant while taking this drug. Females with child-bearing potential will need to have a negative pregnancy test before starting this medicine. Use an effective method of birth control while you are taking this medicine. If you think that you might be pregnant talk to your doctor right away. This medicine may affect blood sugar levels. If you have diabetes, check with your doctor or health care professional before you change your diet or the dose of your diabetic medicine. What side effects may I notice from receiving this medicine? Side effects that you should report to your doctor or health care professional as soon as possible:  allergic reactions like skin rash, itching or hives, swelling of the face, lips, or tongue  breathing problems  dizziness  increased blood pressure  signs and symptoms of a blood clot such as breathing problems; changes in vision; chest pain; severe, sudden headache; pain, swelling, warmth in the leg; trouble speaking; sudden numbness or weakness of the face, arm or leg  swelling of the  ankles, feet, hands  unusually weak or tired  vomiting Side effects that usually do not require medical attention (report to your doctor or health care professional if they continue or are bothersome):  breakthrough menstrual bleeding  changes in sex drive or performance  diarrhea  gas  hot flashes or  flushing  increased appetite  upset stomach  weight gain This list may not describe all possible side effects. Call your doctor for medical advice about side effects. You may report side effects to FDA at 1-800-FDA-1088. Where should I keep my medicine? Keep out of the reach of children. Store at room temperature between 15 and 25 degrees C (59 and 77 degrees F). Keep tightly closed. Protect from heat. Throw away any unused medicine after the expiration date. NOTE: This sheet is a summary. It may not cover all possible information. If you have questions about this medicine, talk to your doctor, pharmacist, or health care provider.  2020 Elsevier/Gold Standard (2017-07-25 10:06:45)

## 2019-02-27 ENCOUNTER — Inpatient Hospital Stay: Payer: Medicare Other

## 2019-02-27 NOTE — Progress Notes (Signed)
Nutrition Assessment   Reason for Assessment:  Weight loss   ASSESSMENT:  63 year old female with s/p left upper lobectomy (benign). Past medical history of h pylori (2017), IBS with constipation, GERD, abdominal pain (follows with GI), COPD  Spoke with patient via phone.  Patient reports that every since h pylori has had a poor appetite. "I try to eat but I don't want it."  I get bloated and can't eat but a few bites."  Reports that typically she eats eggs, grits, sometimes bacon or sausage and does not eat anything else for the remainder of the day.  Reports if she eats any more she gets bloated.  Reports that she is drinking ensure BID and tolerating it well.    Reports bowel movement with linzess about every other day.  If does not take linzess may not have bowel movement for 1 week or 2.    Reports that she can't force herself to eat but she is got to do something due to weight loss.  Nutrition Focused Physical Exam: deferred   Medications: linzess, carafate, megace added yesterday by Dr. Mike Gip   Labs: reviewed   Anthropometrics:   Height: 65 inches Weight: 104 lb 13.3 oz UBW: Noted weight on 02/07/2018 of 116 lb 2018 in 120s BMI: 17  10% weight loss in 1 year   Estimated Energy Needs  Kcals: 1400-1645 calories Protein: 70-82 g Fluid: 1.6 L   NUTRITION DIAGNOSIS: Unintentional weight loss related to altered GI function as evidenced by BMI 17 and 2% weight loss in last month and 10% weight loss in the last year.     INTERVENTION:  Discussed the importance of increasing calories to prevent further weight loss. Patient planning to start taking megace for appetite. Verbalized that she is not ready to make changes to intake.  "I am not going to feel miserable or make myself eat." Agreeable to trying to increase ensure to 3 per day vs 2.  Patient will come to [facility] office tomorrow to pick up 1st case of ensure enlive. Offered recipes for smoothies and  declined.   Recommended adding MVI daily and patient declined.  Encouraged small frequent meals mini meals/snacks. Contact information given   MONITORING, EVALUATION, GOAL: Patient will consume adequate calories to prevent weight loss   Next Visit:  Phone follow-up August 20  Sheila Suarez B. Zenia Resides, [place], [place] Registered Dietitian [phone] (pager)

## 2019-03-03 ENCOUNTER — Ambulatory Visit: Payer: Medicare Other | Attending: Anesthesiology | Admitting: Anesthesiology

## 2019-03-05 ENCOUNTER — Other Ambulatory Visit: Payer: Self-pay

## 2019-03-05 ENCOUNTER — Encounter
Admission: RE | Admit: 2019-03-05 | Discharge: 2019-03-05 | Disposition: A | Discharge status: Home / Self Care | Payer: Medicare Other | Source: Ambulatory Visit | Attending: Hematology and Oncology | Admitting: Hematology and Oncology

## 2019-03-05 DIAGNOSIS — R634 Abnormal weight loss: Secondary | ICD-10-CM | POA: Diagnosis not present

## 2019-03-05 DIAGNOSIS — R911 Solitary pulmonary nodule: Secondary | ICD-10-CM | POA: Diagnosis not present

## 2019-03-05 DIAGNOSIS — Z85118 Personal history of other malignant neoplasm of bronchus and lung: Secondary | ICD-10-CM | POA: Diagnosis not present

## 2019-03-05 LAB — GLUCOSE, CAPILLARY: Glucose-Capillary: 64 mg/dL — ABNORMAL LOW (ref 70–99)

## 2019-03-05 MED ORDER — FLUDEOXYGLUCOSE F - 18 (FDG) INJECTION
5.4000 | Freq: Once | INTRAVENOUS | Status: AC | PRN
  Administered 2019-03-05: 09:00:00 6 via INTRAVENOUS

## 2019-03-06 ENCOUNTER — Ambulatory Visit
Admission: RE | Admit: 2019-03-06 | Discharge: 2019-03-06 | Disposition: A | Discharge status: Home / Self Care | Payer: Medicare Other | Source: Ambulatory Visit | Attending: Hematology and Oncology | Admitting: Hematology and Oncology

## 2019-03-06 DIAGNOSIS — Z1231 Encounter for screening mammogram for malignant neoplasm of breast: Secondary | ICD-10-CM | POA: Diagnosis not present

## 2019-03-06 DIAGNOSIS — R634 Abnormal weight loss: Secondary | ICD-10-CM

## 2019-03-07 ENCOUNTER — Other Ambulatory Visit: Payer: Self-pay | Admitting: Hematology and Oncology

## 2019-03-07 DIAGNOSIS — N6489 Other specified disorders of breast: Secondary | ICD-10-CM

## 2019-03-07 DIAGNOSIS — R928 Other abnormal and inconclusive findings on diagnostic imaging of breast: Secondary | ICD-10-CM

## 2019-03-17 ENCOUNTER — Ambulatory Visit
Admission: RE | Admit: 2019-03-17 | Discharge: 2019-03-17 | Disposition: A | Discharge status: Home / Self Care | Payer: Medicare Other | Source: Ambulatory Visit | Attending: Hematology and Oncology | Admitting: Hematology and Oncology

## 2019-03-17 DIAGNOSIS — N6489 Other specified disorders of breast: Secondary | ICD-10-CM | POA: Diagnosis not present

## 2019-03-17 DIAGNOSIS — R928 Other abnormal and inconclusive findings on diagnostic imaging of breast: Secondary | ICD-10-CM | POA: Diagnosis not present

## 2019-03-17 DIAGNOSIS — R922 Inconclusive mammogram: Secondary | ICD-10-CM | POA: Diagnosis not present

## 2019-03-20 ENCOUNTER — Inpatient Hospital Stay: Payer: Medicare Other | Attending: Hematology and Oncology

## 2019-03-20 DIAGNOSIS — D1803 Hemangioma of intra-abdominal structures: Secondary | ICD-10-CM | POA: Insufficient documentation

## 2019-03-20 DIAGNOSIS — M329 Systemic lupus erythematosus, unspecified: Secondary | ICD-10-CM | POA: Insufficient documentation

## 2019-03-20 DIAGNOSIS — Z79899 Other long term (current) drug therapy: Secondary | ICD-10-CM | POA: Insufficient documentation

## 2019-03-20 DIAGNOSIS — F17210 Nicotine dependence, cigarettes, uncomplicated: Secondary | ICD-10-CM | POA: Insufficient documentation

## 2019-03-20 DIAGNOSIS — J439 Emphysema, unspecified: Secondary | ICD-10-CM | POA: Insufficient documentation

## 2019-03-20 DIAGNOSIS — R634 Abnormal weight loss: Secondary | ICD-10-CM | POA: Insufficient documentation

## 2019-03-20 NOTE — Progress Notes (Signed)
Nutrition Follow-up:  Patient with left upper lobectomy (benign). Followed by Dr. Mike Gip.   Spoke with patient via phone for nutrition follow-up regarding weight loss.  Patient reports that she is taking megace and thinks it may have increased her appetite some.  Reports that she is still only able to eat small amounts due to bloated feeling and fullness.  Reports that she is drinking ensure and is about out.    Reports that she has a bowel movement daily after taking linzess.      Medications: reviewed  Labs: no new  Anthropometrics:   No new weight (104 lb 13.3 oz)  Patient thinks she still weighs 104 lb   NUTRITION DIAGNOSIS: Unintentional weight loss stable   INTERVENTION:  Patient ready to listen to RD regarding strategies to increase calories and protein.  Handout will be provided.   Encouraged patient to keep food diary and track calories to help meet goal.   RD will provide another case of ensure to patient today at [facility] location.  Patient open to receiving recipe book for making smoothies with ensure.  Encouraged small/mini meals q 2 hours due to full feeling Encouraged patient to continue management to prevent constipation.     MONITORING, EVALUATION, GOAL: Patient will consume adequate calories to prevent weight loss   NEXT VISIT: phone f/u Sept 17th   Sheila Suarez B. Zenia Resides, [place], [place] Registered Dietitian [phone] (pager)

## 2019-03-26 ENCOUNTER — Other Ambulatory Visit: Payer: Medicare Other

## 2019-03-26 ENCOUNTER — Ambulatory Visit: Payer: Medicare Other | Admitting: Hematology and Oncology

## 2019-03-30 NOTE — Progress Notes (Signed)
Confirmed Name, DOB, and Address. COVID screening completed. Reminded of mask and no visitor policy. Patient denies any concerns.

## 2019-03-30 NOTE — Progress Notes (Signed)
[facility]  [address] [place], [place] [address] Phone: [phone]  Fax: [phone]   Clinic Day:  03/31/2019  Referring physician: Letta Median, MD  Chief Complaint: Sheila Suarez is a 63 y.o. female with lupus s/p left upper lobe lobectomy (benign) and weight loss who is seen for 1 month assessment and review of interval PET scan.  HPI: The patient was last seen in the hematology clinic on 02/26/2019.  At that time, she was fatigued.  She had early satiety.  She had been drinking Ensure.  Weight was up 3 pounds.  She was seen by Jennet Maduro, RD on 02/27/2019 and 03/20/2019.  Notes reviewed.  She was taking Megace.  Her appetite had increased.  He was only able to eat small amounts due to a feeling of fullness and bloating.  She was drinking Ensure.  Allergies to increase caloric and protein intake were provided.  Is to keep track of her calories to meet goals.  Intervention was provided.  Small mini-meals every 2 hours were recommended.  PET scan on 03/05/2019 revealed no evidence of recurrent or metastatic disease.   Bilateral screening mammogram on 03/06/2019 revealed a possible asymmetry in the left breast.  Diagnostic mammogram and ultrasound on 03/17/2019 revealed no focal abnormality in the outer left breast or retroareolar region.  Annual bilateral screening mammogram was recommended.  During the interim, her appetite is better.  She states that the dietician wants her to eat 1600 cal/day.  She has gained 2 pounds.  Her energy level is a little better.  She is taking Megace.  She denies any breast concerns although the radiologist told her that she has "high risk breasts".   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    Shortness of breath dyspnea    Sinus infection    9/19    Past Surgical History:  Procedure Laterality Date   COLONOSCOPY WITH PROPOFOL N/A 06/02/2016   Procedure: COLONOSCOPY WITH PROPOFOL;  Surgeon: [name], MD;  Location: [facility] ENDOSCOPY;  Service: Gastroenterology;  Laterality: N/A;   CYST EXCISION Left    hand   ESOPHAGOGASTRODUODENOSCOPY (EGD) WITH PROPOFOL N/A 06/02/2016   Procedure: ESOPHAGOGASTRODUODENOSCOPY (EGD) WITH PROPOFOL;  Surgeon: [name], MD;  Location: [facility] ENDOSCOPY;  Service: Gastroenterology;  Laterality: N/A;   ESOPHAGOGASTRODUODENOSCOPY (EGD) WITH PROPOFOL N/A 05/31/2018   Procedure: ESOPHAGOGASTRODUODENOSCOPY (EGD) WITH PROPOFOL;  Surgeon: [name], MD;  Location: [facility] ENDOSCOPY;  Service: Gastroenterology;  Laterality: N/A;   FLEXIBLE BRONCHOSCOPY N/A 04/17/2016   Procedure: FLEXIBLE BRONCHOSCOPY;  Surgeon: [name], MD;  Location: [facility] ORS;  Service: Thoracic;  Laterality: N/A;   HAND SURGERY Left    left upper lobectomy Left 03/2016   LUNG REMOVAL, PARTIAL Left    Left lower lobe   THORACOTOMY Left 04/17/2016   Procedure: THORACOTOMY LEFT UPPER LOBE LUNG RESECTION;  Surgeon: [name], MD;  Location: [facility] ORS;  Service: Thoracic;  Laterality: Left;   TUBAL LIGATION Bilateral    37 years ago    Family History  Problem Relation Age of Onset   Pancreatic cancer Sister    Lung cancer Sister    Lung cancer Brother    Stomach cancer Paternal Grandmother    Heart disease Father  Cancer Daughter 20       cervical   Cancer Maternal Aunt        brain   Cancer Maternal Uncle        prostate   Cancer Maternal Aunt        bone    Social History:  reports that she quit smoking about 3 years ago. Her smoking use included cigarettes. She has a 8.75 pack-year smoking history. She has never used smokeless tobacco. She reports current alcohol use. She reports that she does not use drugs. She has a 8.75 pack-year smoking history. She  quit smokeless tobacco use about 6 months ago. She reports that she does not drink alcohol or use drugs.She works in [employer], [employer]. She lives in [place].The patient is alone today.  Allergies: No Known Allergies  Current Medications: Current Outpatient Medications  Medication Sig Dispense Refill   albuterol (PROVENTIL HFA;VENTOLIN HFA) 108 (90 Base) MCG/ACT inhaler Inhale 1-2 puffs into the lungs every 4 (four) hours as needed for wheezing or shortness of breath. 1 Inhaler 12   cyclobenzaprine (FLEXERIL) 5 MG tablet Take 1-2 tablets 3 times daily as needed 20 tablet 0   gabapentin (NEURONTIN) 100 MG capsule Take 1 capsule (100 mg total) by mouth at bedtime. May take a second capsule at bedside if necessary 60 capsule 2   lactulose (CEPHULAC) 20 g packet Take 1 packet (20 g total) by mouth 3 (three) times daily. 30 each 0   lidocaine (LIDODERM) 5 % Place 1 patch onto the skin daily. Remove & Discard patch within 12 hours or as directed by MD 30 patch 0   linaclotide (LINZESS) 72 MCG capsule Take 72 mcg by mouth daily before breakfast.      megestrol (MEGACE) 400 MG/10ML suspension Take 5 mLs (200 mg total) by mouth daily. 240 mL 0   sucralfate (CARAFATE) 1 g tablet Take 1 tablet (1 g total) by mouth 4 (four) times daily. 244 tablet 0   umeclidinium-vilanterol (ANORO ELLIPTA) 62.5-25 MCG/INH AEPB Inhale 1 puff into the lungs daily. 1 each 12   No current facility-administered medications for this visit.     Review of Systems  Constitutional: Negative.  Negative for chills, diaphoresis, fever, malaise/fatigue and weight loss (up 2 lbs).       Feels "a little better".  HENT: Negative.  Negative for congestion, hearing loss, nosebleeds, sinus pain and sore throat.   Eyes: Negative for blurred vision, double vision and pain.       Poor vision (near and far).  Got new glasses.  Right eye getting worse- procedure planned. 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    Respiratory: Negative.  Negative for cough, sputum production, shortness of breath and wheezing.        S/p left upper lobe lobectomy.  Cardiovascular: Negative.  Negative for chest pain, palpitations, orthopnea, leg swelling and PND.  Gastrointestinal: Negative for abdominal pain, blood in stool, constipation, diarrhea, heartburn, melena, nausea and vomiting.       Appetite better.  Genitourinary: Negative.  Negative for dysuria, frequency, hematuria and urgency.  Musculoskeletal: Negative.  Negative for back pain, joint pain and myalgias.  Skin: Negative.  Negative for rash.  Neurological: Positive for headaches (migraines). Negative for dizziness, tingling, sensory change, speech change, focal weakness and weakness.  Endo/Heme/Allergies: Negative.  Does not bruise/bleed easily.  Psychiatric/Behavioral: Negative.  Negative for depression and memory loss. The patient is not nervous/anxious and does not have insomnia.  All other systems reviewed and are negative.  Performance status (ECOG): 1  Vitals Blood pressure 135/84, pulse 77, temperature (!) 97 F (36.1 C), temperature source Tympanic, resp. rate 18, height 5\' 5"  (1.651 m), weight 106 lb 7.7 oz (48.3 kg), SpO2 100 %.   Physical Exam  Constitutional: She is oriented to person, place, and time. No distress.  Thin woman sitting in the exam room in no acute distress.  HENT:  Head: Normocephalic and atraumatic.  Mouth/Throat: Oropharynx is clear and moist. No oropharyngeal exudate.  Gray hair pulled up.  Mask.  Eyes: Pupils are equal, round, and reactive to light. Conjunctivae and EOM are normal. No scleral icterus.  Glasses. Brown eyes.   Neck: Normal range of motion. Neck supple. No JVD present.  Cardiovascular: Normal rate, regular rhythm and normal heart sounds.  Pulmonary/Chest: Effort normal and breath sounds normal. No respiratory distress. She has no wheezes. She has no rales. Right breast exhibits no skin change  (fibrocystic changes 9-12 o'clock). Left breast exhibits no skin change (fibrocystic changes 3-6 o'clock).  Abdominal: Soft. Bowel sounds are normal. She exhibits no distension and no mass. There is no splenomegaly or hepatomegaly. There is no abdominal tenderness. There is no rebound and no guarding.  Musculoskeletal: Normal range of motion.        General: No edema.  Lymphadenopathy:    She has no cervical adenopathy.  Neurological: She is alert and oriented to person, place, and time.  Skin: Skin is warm and dry. No rash noted. She is not diaphoretic. No erythema. No pallor.  Psychiatric: She has a normal mood and affect. Her behavior is normal. Judgment and thought content normal.  Nursing note and vitals reviewed.   Appointment on 03/31/2019  Component Date Value Ref Range Status   WBC 03/31/2019 9.7  4.0 - 10.5 K/uL Final   RBC 03/31/2019 4.06  3.87 - 5.11 MIL/uL Final   Hemoglobin 03/31/2019 12.7  12.0 - 15.0 g/dL Final   HCT 03/31/2019 39.4  36.0 - 46.0 % Final   MCV 03/31/2019 97.0  80.0 - 100.0 fL Final   MCH 03/31/2019 31.3  26.0 - 34.0 pg Final   MCHC 03/31/2019 32.2  30.0 - 36.0 g/dL Final   RDW 03/31/2019 13.2  11.5 - 15.5 % Final   Platelets 03/31/2019 449* 150 - 400 K/uL Final   nRBC 03/31/2019 0.0  0.0 - 0.2 % Final   Neutrophils Relative % 03/31/2019 53  % Final   Neutro Abs 03/31/2019 5.0  1.7 - 7.7 K/uL Final   Lymphocytes Relative 03/31/2019 40  % Final   Lymphs Abs 03/31/2019 3.9  0.7 - 4.0 K/uL Final   Monocytes Relative 03/31/2019 7  % Final   Monocytes Absolute 03/31/2019 0.7  0.1 - 1.0 K/uL Final   Eosinophils Relative 03/31/2019 0  % Final   Eosinophils Absolute 03/31/2019 0.0  0.0 - 0.5 K/uL Final   Basophils Relative 03/31/2019 0  % Final   Basophils Absolute 03/31/2019 0.0  0.0 - 0.1 K/uL Final   Immature Granulocytes 03/31/2019 0  % Final   Abs Immature Granulocytes 03/31/2019 0.03  0.00 - 0.07 K/uL Final   Performed at [facility], [address] [place], [place] [address]   Sodium 03/31/2019 140  135 - 145 mmol/L Final   Potassium 03/31/2019 3.8  3.5 - 5.1 mmol/L Final   Chloride 03/31/2019 108  98 - 111 mmol/L Final   CO2 03/31/2019 24  22 -  32 mmol/L Final   Glucose, Bld 03/31/2019 90  70 - 99 mg/dL Final   BUN 03/31/2019 11  8 - 23 mg/dL Final   Creatinine, Ser 03/31/2019 0.76  0.44 - 1.00 mg/dL Final   Calcium 03/31/2019 8.8* 8.9 - 10.3 mg/dL Final   Total Protein 03/31/2019 6.6  6.5 - 8.1 g/dL Final   Albumin 03/31/2019 3.6  3.5 - 5.0 g/dL Final   AST 03/31/2019 16  15 - 41 U/L Final   ALT 03/31/2019 14  0 - 44 U/L Final   Alkaline Phosphatase 03/31/2019 49  38 - 126 U/L Final   Total Bilirubin 03/31/2019 0.8  0.3 - 1.2 mg/dL Final   GFR calc non Af Amer 03/31/2019 >60  >60 mL/min Final   GFR calc Af Amer 03/31/2019 >60  >60 mL/min Final   Anion gap 03/31/2019 8  5 - 15 Final   Performed at [facility], [address], [place], [place] [address]    Assessment:  Sheila Suarez is a 63 y.o. female withlupusx 8 years. CT angiogram of the chest, abdominal and pelvis on 02/17/2016 revealed a 1.1 cm spiculated lesion in the left upper lobeincreased from prior exam (08/30/2010). There were stable small pulmonary nodules bilaterally (2-3 mm). She has a 15 pack year smoking history.  PET scanon 02/15/2016 revealed an irregular 1.0 cm apical LUL pulmonary nodule (SUV 0.7). There was a 4 mm right middle lobe solid pulmonary nodule (stable since 08/02/2010) and two additional right lower lobe 3 mm pulmonary nodules. PFTs revealed an FEV1 was 84% and her DLCO was 67%.  She underwent left thoracotomy with left upper lobe lobectomyon 04/17/2016. Pathologyrevealed subpleural fibro-elastotic scarconsistent with benign pulmonary cap. There was no evidence of malignancy. There were 2 benign lymph nodes. She had a pneumothorax requiring chest tube  placement.  She has received the series of rabies injectionsafter being bitten by a stray dog. EGD and colonoscopywere normal on 06/02/2016. 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 There was no dysplasia or malignancy. Shewas treatedfor H pylori beginning11/18/2017.  EGDon 05/31/2018 revealed normal examined duodenum, normal esophagus, and gastritis. Pathology revealed moderate reactive gastropathy, negative for inflammation, H. pylori, metaplasia, dysplasia, or malignancy. Colonoscopyon 06/02/2016 revealed no abnormal findings.  Abdomen and pelvis CTon 08/02/2018 revealed no acute findings. There was a 1.5 cm lesion in the right lobe of the liver c/w an hemangioma. Chest CTon 12/24/2018 revealed no acute findings s/p left upper lobe lobectomy. There was a stable benign 23mm pulmonary nodule in the right upper lobe.  PET scan on 03/05/2019 revealed no evidence of recurrent or metastatic disease.   Bilateral screening mammogram on 03/06/2019 revealed a possible asymmetry in the left breast.  Diagnostic mammogram and ultrasound on 03/17/2019 revealed no focal abnormality in the outer left breast or retroareolar region.  Labs on 07/07/2020included: hematocrit 42.7, hemoglobin 14.1, MCV 93, platelets 515,000, WBC 11,900 (ANC 8900; ALC 2100). TSH was 0.512. Normal studiesincluded: Creatinine(1.030) andLFTs.  Work-up on 02/10/2019: hematocrit 44.8, hemoglobin 14.4, MCV 97.4, platelets 421,000, WBC 9,700. LDH 135, sed rate 1, CRP <0.8. Free T4 0.75.  Urinalysis was negative.  Peripheral smear showed high normal leukocyte count with unremarkable morphology, normochromic, normocytic erythrocytes, and mild thrombocytosis with unremarkable morphology.   Symptomatically, she is feeling better.  She is gaining weight.  Exam is unremarkable.  Plan: 1.   Labs today: CBC with diff, CMP.  2. Unexplained weight loss Clinically, she  is doing better.  CT scans and  PET scan revealed no evidence of malignancy.  She is gaining weight with the assistance of the dietician and Megace. EGD on 05/31/2018 revealed no evidence of malignancy. Colonoscopy on 06/02/2016 revealed no evidence of malignancy. Mammogram and ultrasound revealed no evidence of disease.  Breast exam reveals only fibrocystic changes.  TSH was normal on 02/04/2019. Free T4 was normal on 02/10/2019. Patient declined HIV testing. Lupus remains quiescent.             Continue Megace.  Continue follow-up with nutrition. Samples of Ensure today. 3.Follow-up with radiology (Dr Derrel Nip) regarding what she was told about her mammogram ("high risk for cancer"). 4.   Weight check monthly x 2. 5.   RTC in 3 months for MD assessment and +/- labs.  I discussed the assessment and treatment plan with the patient.  The patient was provided an opportunity to ask questions and all were answered.  The patient agreed with the plan and demonstrated an understanding of the instructions.  The patient was advised to call back if the symptoms worsen or if the condition fails to improve as anticipated.   Lequita Asal, MD, PhD    03/31/2019, 11:17 AM

## 2019-03-31 ENCOUNTER — Other Ambulatory Visit: Payer: Self-pay

## 2019-03-31 ENCOUNTER — Inpatient Hospital Stay (HOSPITAL_BASED_OUTPATIENT_CLINIC_OR_DEPARTMENT_OTHER): Payer: Medicare Other | Admitting: Hematology and Oncology

## 2019-03-31 ENCOUNTER — Encounter: Payer: Self-pay | Admitting: Hematology and Oncology

## 2019-03-31 ENCOUNTER — Inpatient Hospital Stay: Payer: Medicare Other

## 2019-03-31 VITALS — BP 135/84 | HR 77 | Temp 97.0°F | Resp 18 | Ht 65.0 in | Wt 106.5 lb

## 2019-03-31 DIAGNOSIS — J439 Emphysema, unspecified: Secondary | ICD-10-CM | POA: Diagnosis not present

## 2019-03-31 DIAGNOSIS — D72829 Elevated white blood cell count, unspecified: Secondary | ICD-10-CM

## 2019-03-31 DIAGNOSIS — M329 Systemic lupus erythematosus, unspecified: Secondary | ICD-10-CM | POA: Diagnosis not present

## 2019-03-31 DIAGNOSIS — R634 Abnormal weight loss: Secondary | ICD-10-CM

## 2019-03-31 DIAGNOSIS — Z79899 Other long term (current) drug therapy: Secondary | ICD-10-CM | POA: Diagnosis not present

## 2019-03-31 DIAGNOSIS — F17210 Nicotine dependence, cigarettes, uncomplicated: Secondary | ICD-10-CM | POA: Diagnosis not present

## 2019-03-31 DIAGNOSIS — D1803 Hemangioma of intra-abdominal structures: Secondary | ICD-10-CM | POA: Diagnosis not present

## 2019-03-31 LAB — CBC WITH DIFFERENTIAL/PLATELET
Abs Immature Granulocytes: 0.03 10*3/uL (ref 0.00–0.07)
Basophils Absolute: 0 10*3/uL (ref 0.0–0.1)
Basophils Relative: 0 %
Eosinophils Absolute: 0 10*3/uL (ref 0.0–0.5)
Eosinophils Relative: 0 %
HCT: 39.4 % (ref 36.0–46.0)
Hemoglobin: 12.7 g/dL (ref 12.0–15.0)
Immature Granulocytes: 0 %
Lymphocytes Relative: 40 %
Lymphs Abs: 3.9 10*3/uL (ref 0.7–4.0)
MCH: 31.3 pg (ref 26.0–34.0)
MCHC: 32.2 g/dL (ref 30.0–36.0)
MCV: 97 fL (ref 80.0–100.0)
Monocytes Absolute: 0.7 10*3/uL (ref 0.1–1.0)
Monocytes Relative: 7 %
Neutro Abs: 5 10*3/uL (ref 1.7–7.7)
Neutrophils Relative %: 53 %
Platelets: 449 10*3/uL — ABNORMAL HIGH (ref 150–400)
RBC: 4.06 MIL/uL (ref 3.87–5.11)
RDW: 13.2 % (ref 11.5–15.5)
WBC: 9.7 10*3/uL (ref 4.0–10.5)
nRBC: 0 % (ref 0.0–0.2)

## 2019-03-31 LAB — COMPREHENSIVE METABOLIC PANEL
ALT: 14 U/L (ref 0–44)
AST: 16 U/L (ref 15–41)
Albumin: 3.6 g/dL (ref 3.5–5.0)
Alkaline Phosphatase: 49 U/L (ref 38–126)
Anion gap: 8 (ref 5–15)
BUN: 11 mg/dL (ref 8–23)
CO2: 24 mmol/L (ref 22–32)
Calcium: 8.8 mg/dL — ABNORMAL LOW (ref 8.9–10.3)
Chloride: 108 mmol/L (ref 98–111)
Creatinine, Ser: 0.76 mg/dL (ref 0.44–1.00)
GFR calc Af Amer: >60 mL/min (ref >60)
GFR calc non Af Amer: >60 mL/min (ref >60)
Glucose, Bld: 90 mg/dL (ref 70–99)
Potassium: 3.8 mmol/L (ref 3.5–5.1)
Sodium: 140 mmol/L (ref 135–145)
Total Bilirubin: 0.8 mg/dL (ref 0.3–1.2)
Total Protein: 6.6 g/dL (ref 6.5–8.1)

## 2019-03-31 MED ORDER — MEGESTROL ACETATE 400 MG/10ML PO SUSP: 200.0000 mg | Freq: Every day | ORAL | 0 refills | Status: AC

## 2019-04-05 ENCOUNTER — Encounter: Payer: Self-pay | Admitting: Emergency Medicine

## 2019-04-05 ENCOUNTER — Other Ambulatory Visit: Payer: Self-pay

## 2019-04-05 ENCOUNTER — Emergency Department
Admission: EM | Admit: 2019-04-05 | Discharge: 2019-04-05 | Disposition: A | Discharge status: Home / Self Care | Payer: Medicare Other | Attending: Student in an Organized Health Care Education/Training Program | Admitting: Student in an Organized Health Care Education/Training Program

## 2019-04-05 ENCOUNTER — Emergency Department: Payer: Medicare Other

## 2019-04-05 DIAGNOSIS — Z79899 Other long term (current) drug therapy: Secondary | ICD-10-CM | POA: Insufficient documentation

## 2019-04-05 DIAGNOSIS — Y999 Unspecified external cause status: Secondary | ICD-10-CM | POA: Diagnosis not present

## 2019-04-05 DIAGNOSIS — Z87891 Personal history of nicotine dependence: Secondary | ICD-10-CM | POA: Diagnosis not present

## 2019-04-05 DIAGNOSIS — Y9389 Activity, other specified: Secondary | ICD-10-CM | POA: Insufficient documentation

## 2019-04-05 DIAGNOSIS — Z85118 Personal history of other malignant neoplasm of bronchus and lung: Secondary | ICD-10-CM | POA: Insufficient documentation

## 2019-04-05 DIAGNOSIS — S40011A Contusion of right shoulder, initial encounter: Secondary | ICD-10-CM | POA: Diagnosis not present

## 2019-04-05 DIAGNOSIS — S01511A Laceration without foreign body of lip, initial encounter: Secondary | ICD-10-CM | POA: Insufficient documentation

## 2019-04-05 DIAGNOSIS — Y929 Unspecified place or not applicable: Secondary | ICD-10-CM | POA: Diagnosis not present

## 2019-04-05 DIAGNOSIS — I2510 Atherosclerotic heart disease of native coronary artery without angina pectoris: Secondary | ICD-10-CM | POA: Diagnosis not present

## 2019-04-05 DIAGNOSIS — M25511 Pain in right shoulder: Secondary | ICD-10-CM | POA: Diagnosis not present

## 2019-04-05 MED ORDER — AMOXICILLIN-POT CLAVULANATE 875-125 MG PO TABS: 1.0000 | ORAL_TABLET | Freq: Two times a day (BID) | ORAL | 0 refills | Status: DC

## 2019-04-05 MED ORDER — LIDOCAINE-EPINEPHRINE (PF) 2 %-1:200000 IJ SOLN
10.0000 mL | Freq: Once | INTRAMUSCULAR | Status: AC
  Administered 2019-04-05: 10 mL
  Filled 2019-04-05: qty 10

## 2019-04-05 MED ORDER — CHLORHEXIDINE GLUCONATE 0.12 % MT SOLN: 10.0000 mL | Freq: Two times a day (BID) | OROMUCOSAL | 0 refills | Status: AC
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
## 2019-04-21 DIAGNOSIS — S0181XA Laceration without foreign body of other part of head, initial encounter: Secondary | ICD-10-CM | POA: Diagnosis not present

## 2019-04-23 ENCOUNTER — Other Ambulatory Visit: Payer: Self-pay

## 2019-04-24 ENCOUNTER — Inpatient Hospital Stay: Payer: Medicare Other | Attending: Hematology and Oncology

## 2019-04-24 NOTE — Progress Notes (Addendum)
Nutrition Follow-up:  Patient with left upper lobectomy (benign).  Followed by Dr. Mike Gip.  Spoke with patient via phone for nutrition follow-up.  Patient reports that she has not been eating well because fell off her back porch and cut her lip.  Reports had 17 stiches placed.  Notes from ED on 9/5 reviewed.  Reports that she had the stiches removed on 9/21.  Reports that she is eating better.  Has been eating macaroni and cheese adding more cheese, mashed potatoes, meatloaf, cooked cabbage.  Reports that she is drinking ensure. Has not tried any of the smoothie recipes.    Reports that she has stopped taking the linzess "awhile" ago.  Reports bowels are moving but not daily.  Thinks that bloating and fullness is better.      Medications: reviewed  Labs: reviewed  Anthropometrics:   Weight noted on 8/31 in Dr Kem Parkinson office 106 lb 7.7 oz increased from 104 lb.    Noted weight check again on 9/28  NUTRITION DIAGNOSIS: Unintentional weight loss improving   INTERVENTION:  Patient has not kept food diary. Reviewed importance of eating/drinking high calorie foods/beverages to continue weight gain.  Reviewed strategies to do that.  Reviewed importance of drinking high calorie (350+) oral nutrition supplements shakes.  Has not tried smoothie recipes given at last visit.  Reviewed that anything added to shakes increase the calories.  Offered another case of ensure enlive today and patient will come pick it up at [place] office today.  Patient has contact information.     MONITORING, EVALUATION, GOAL: Patient will consume adequate calories to prevent weight loss   NEXT VISIT: phone f/u October 22  Raphael Espe B. Zenia Resides, [place], [place] Registered Dietitian [phone] (pager)

## 2019-04-25 ENCOUNTER — Other Ambulatory Visit: Payer: Self-pay

## 2019-04-28 ENCOUNTER — Inpatient Hospital Stay: Payer: Medicare Other

## 2019-04-28 ENCOUNTER — Telehealth: Payer: Self-pay

## 2019-04-28 ENCOUNTER — Other Ambulatory Visit: Payer: Self-pay

## 2019-04-28 NOTE — Telephone Encounter (Signed)
The patient in today for monthly weights. Today the patient weight was 109 from last month weight check b/p today 124/84 p85. The patient was given 10 ensure's.

## 2019-04-29 ENCOUNTER — Inpatient Hospital Stay: Payer: Medicare Other

## 2019-05-22 ENCOUNTER — Inpatient Hospital Stay: Payer: Medicare Other | Attending: Hematology and Oncology

## 2019-05-22 NOTE — Progress Notes (Signed)
Nutrition  Called patient for nutrition follow-up.  No answer. Left message with call back number.  Elyanah Farino B. Zenia Resides, [place], [place] Registered Dietitian [phone] (pager)

## 2019-05-26 ENCOUNTER — Telehealth: Payer: Self-pay

## 2019-05-26 ENCOUNTER — Inpatient Hospital Stay: Payer: Medicare Other

## 2019-05-26 ENCOUNTER — Other Ambulatory Visit: Payer: Self-pay

## 2019-05-26 NOTE — Telephone Encounter (Signed)
Nutrition Follow-up:  Patient with left upper lobectomy (benign).  Followed by Dr. Mike Gip. Noted results of PET scan on 03/05/2019.    Patient returned RD's call.  Spoke with patient via phone for nutrition follow-up.  Patient reports that she was weighed today in Dr. Kem Parkinson office and lost 5 pounds (104 lb today).  Reports that she has been under a lot of stress lately as daughter is getting ready to have surgery and she is concerned about her.  Reports that she has just lost her appetite again.  Not taking megace on regular basis.  Verbalizes that when she was taking the megace she ate more.  Has been trying to drink the ensure shakes but not as much as before.  Has only had cereal and banana today so far.    Denies pain, stomach upset, nausea.    Medications: megace  Labs: no recent  Anthropometrics:   Noted 109 lb on 9/28.  Patient reports today's weight was 104 lb.    NUTRITION DIAGNOSIS: Unintentional weight loss continues   INTERVENTION:  Patient plans on taking megace daily. Patient will come pick up case of ensure enlive (350 calorie) today at [facility] office.  Encouraged patient to drink 2-3 per day.  Reviewed with patient ways to increase calories to create weight gain.  "I remember what you told me to do."  "I am going to do better."   We talked about how good nutrition will help give her the energy she needs to take care of her daughter when she is discharged from the hospital.   Patient has contact information  MONITORING, EVALUATION, GOAL: Patient will consume adequate calories to prevent weight loss.    NEXT VISIT: phone f/u Thursday, Dec 3  Chelbi Herber B. Zenia Resides, [place], [place] Registered Dietitian [phone] (pager)

## 2019-06-20 ENCOUNTER — Inpatient Hospital Stay: Payer: Medicare Other

## 2019-06-22 NOTE — Progress Notes (Signed)
[facility]  [address] [place], [place] [address] Phone: [phone]  Fax: [phone]   Telephone Visit:  06/23/2019  Referring physician: Letta Median, MD  I connected with Sheila Suarez on 06/23/2019 at 9:42 AM by telephone and verified that I was speaking with the correct person using 2 identifiers.  The patient was at home.  I discussed the limitations, risk, security and privacy concerns of performing an evaluation and management service by telephone and the availability of in person appointments.  I also discussed with the patient that there may be a patient responsible charge related to this service.  The patient expressed understanding and agreed to proceed.   Chief Complaint: Sheila Suarez is a 63 y.o. female with lupus s/p left upper lobe lobectomy(benign)and weight losswho is seen for3 month assessment  HPI: The patient was last seen in the hematology clinic on 03/31/2019. At that time, she was feeling better. She was gaining weight. Exam was unremarkable. Platelet count was  449,000. Calcium was 8.8. Patient was given samples of Ensure. She continued Megace.   Patient spoke with Jennet Maduro, RD via telephone on 04/24/2019. She was not eating well. She was drinking Ensure. Her bloating and fullness were better.   Patient spoke with Jennet Maduro, RD on 05/26/2019 via telephone. She noted a 5 pound weight loss.  Her current weight was 104 pounds. She noted stress secondary to family. She noted a decrease in appetite. She was not taking Megace on a regular basis.   During the interim, she has felt "stressed". She is stressed because her daughter has an upcoming major surgery. Her headaches improved. She has low energy and feels tired all the time. She notes feeling off balance. She notes back pain. She notes not having any recent back injections.   She is not taking her Megace as prescribed. She notes a decreased appetite. Her  weight is down to 102 pounds. She is drinking Ensure TID. She is interested in receiving food from the clinic food pantry. She would like to get more Ensure samples from our clinic.    Past Medical History:  Diagnosis Date  . Cancer ([place])    lung  . Coronary artery disease   . Emphysema of lung ([place])   . GERD (gastroesophageal reflux disease)   . Lupus ([place])   . 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 Shortness of breath dyspnea   . Sinus infection    9/19    Past Surgical History:  Procedure Laterality Date  . COLONOSCOPY WITH PROPOFOL N/A 06/02/2016   Procedure: COLONOSCOPY WITH PROPOFOL;  Surgeon: [name], MD;  Location: [facility] ENDOSCOPY;  Service: Gastroenterology;  Laterality: N/A;  . CYST EXCISION Left    hand  . ESOPHAGOGASTRODUODENOSCOPY (EGD) WITH PROPOFOL N/A 06/02/2016   Procedure: ESOPHAGOGASTRODUODENOSCOPY (EGD) WITH PROPOFOL;  Surgeon: [name], MD;  Location: [facility] ENDOSCOPY;  Service: Gastroenterology;  Laterality: N/A;  . ESOPHAGOGASTRODUODENOSCOPY (EGD) WITH PROPOFOL N/A 05/31/2018   Procedure: ESOPHAGOGASTRODUODENOSCOPY (EGD) WITH PROPOFOL;  Surgeon: [name], MD;  Location: [facility] ENDOSCOPY;  Service: Gastroenterology;  Laterality: N/A;  . FLEXIBLE BRONCHOSCOPY N/A 04/17/2016   Procedure: FLEXIBLE BRONCHOSCOPY;  Surgeon: [name], MD;  Location: [facility] ORS;  Service: Thoracic;  Laterality: N/A;  . HAND SURGERY Left   . left upper lobectomy Left 03/2016  . LUNG REMOVAL, PARTIAL Left    Left lower lobe  . THORACOTOMY Left 04/17/2016   Procedure: THORACOTOMY LEFT UPPER LOBE LUNG RESECTION;  Surgeon: [name], MD;  Location: [facility] ORS;  Service: Thoracic;  Laterality: Left;  . TUBAL LIGATION Bilateral    37 years ago    Family History  Problem Relation Age of Onset  . Pancreatic cancer Sister   . Lung cancer Sister   . Lung cancer Brother   . Stomach cancer Paternal Grandmother   . Heart disease Father   . Cancer Daughter 30        cervical  . Cancer Maternal Aunt        brain  . Cancer Maternal Uncle        prostate  . Cancer Maternal Aunt        bone    Social History:  reports that she quit smoking about 3 years ago. Her smoking use included cigarettes. She has a 8.75 pack-year smoking history. She has never used smokeless tobacco. She reports current alcohol use. She reports that she does not use drugs. She has a 8.75 pack-year smoking history. She quit smokeless tobacco use about 6 months ago. 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 She lives in [place]. The patient is alone today.  Participants in the patient's visit and their role in the encounter included the patient Vito Berger, CMA, today.  The intake visit was provided by Vito Berger, CMA.  Allergies: No Known Allergies  Current Medications: Current Outpatient Medications  Medication Sig Dispense Refill  . gabapentin (NEURONTIN) 100 MG capsule Take 1 capsule (100 mg total) by mouth at bedtime. May take a second capsule at bedside if necessary 60 capsule 2  . lidocaine (LIDODERM) 5 % Place 1 patch onto the skin daily. Remove & Discard patch within 12 hours or as directed by MD 30 patch 0  . linaclotide (LINZESS) 72 MCG capsule Take 72 mcg by mouth daily before breakfast.     . megestrol (MEGACE) 400 MG/10ML suspension Take 5 mLs (200 mg total) by mouth daily. 240 mL 0  . sucralfate (CARAFATE) 1 g tablet Take 1 tablet (1 g total) by mouth 4 (four) times daily. 244 tablet 0  . umeclidinium-vilanterol (ANORO ELLIPTA) 62.5-25 MCG/INH AEPB Inhale 1 puff into the lungs daily. 1 each 12  . albuterol (PROVENTIL HFA;VENTOLIN HFA) 108 (90 Base) MCG/ACT inhaler Inhale 1-2 puffs into the lungs every 4 (four) hours as needed for wheezing or shortness of breath. (Patient not taking: Reported on 06/23/2019) 1 Inhaler 12  . amoxicillin-clavulanate (AUGMENTIN) 875-125 MG tablet Take 1 tablet by  mouth 2 (two) times daily. 14 tablet 0  . chlorhexidine (PERIDEX) 0.12 % solution Use as directed 10 mLs in the mouth or throat 2 (two) times daily. Swish and spit 120 mL 0  . cyclobenzaprine (FLEXERIL) 5 MG tablet Take 1-2 tablets 3 times daily as needed (Patient not taking: Reported on 06/23/2019) 20 tablet 0  . lactulose (CEPHULAC) 20 g packet Take 1 packet (20 g total) by mouth 3 (three) times daily. (Patient not taking: Reported on 06/23/2019) 30 each 0   No current facility-administered medications for this visit.      Review of Systems  Constitutional: Positive for malaise/fatigue (low energy) and weight loss (4 pounds). Negative for chills, diaphoresis and fever.       Feels "stressed".  HENT: Negative.  Negative for congestion, hearing loss, nosebleeds, sinus pain and sore throat.   Eyes: Negative for blurred vision, double vision and pain.       Poor  vision (near and far).  Got new glasses.   Respiratory: Negative.  Negative for cough, sputum production, shortness of breath and wheezing.        S/p left upper lobe lobectomy.  Cardiovascular: Negative.  Negative for chest pain, palpitations, orthopnea, leg swelling and PND.  Gastrointestinal: Negative for abdominal pain, blood in stool, constipation, diarrhea, heartburn, melena, nausea and vomiting.       Decreased appetite.  Drinking Ensure TID.  Genitourinary: Negative.  Negative for dysuria, frequency, hematuria and urgency.  Musculoskeletal: Positive for back pain. Negative for joint pain and myalgias.  Skin: Negative.  Negative for rash.  Neurological: Negative for dizziness, tingling, sensory change, speech change, focal weakness, weakness and headaches (migraines; improved).       Off balance.  Endo/Heme/Allergies: Negative.  Does not bruise/bleed easily.  Psychiatric/Behavioral: Negative.  Negative for depression and memory loss. The patient is not nervous/anxious and does not have insomnia.        Stress related to  family.  All other systems reviewed and are negative.   Performance status (ECOG): 1   No visits with results within 3 Day(s) from this visit.  Latest known visit with results is:  Appointment on 03/31/2019  Component Date Value Ref Range Status  . WBC 03/31/2019 9.7  4.0 - 10.5 K/uL Final  . RBC 03/31/2019 4.06  3.87 - 5.11 MIL/uL Final  . Hemoglobin 03/31/2019 12.7  12.0 - 15.0 g/dL Final  . HCT 03/31/2019 39.4  36.0 - 46.0 % Final  . MCV 03/31/2019 97.0  80.0 - 100.0 fL Final  . MCH 03/31/2019 31.3  26.0 - 34.0 pg Final  . MCHC 03/31/2019 32.2  30.0 - 36.0 g/dL Final  . RDW 03/31/2019 13.2  11.5 - 15.5 % Final  . Platelets 03/31/2019 449* 150 - 400 K/uL Final  . nRBC 03/31/2019 0.0  0.0 - 0.2 % Final  . Neutrophils Relative % 03/31/2019 53  % Final  . Neutro Abs 03/31/2019 5.0  1.7 - 7.7 K/uL Final  . Lymphocytes Relative 03/31/2019 40  % Final  . Lymphs Abs 03/31/2019 3.9  0.7 - 4.0 K/uL Final  . Monocytes Relative 03/31/2019 7  % Final  . Monocytes Absolute 03/31/2019 0.7  0.1 - 1.0 K/uL Final  . Eosinophils Relative 03/31/2019 0  % Final  . Eosinophils Absolute 03/31/2019 0.0  0.0 - 0.5 K/uL Final  . Basophils Relative 03/31/2019 0  % Final  . Basophils Absolute 03/31/2019 0.0  0.0 - 0.1 K/uL Final  . Immature Granulocytes 03/31/2019 0  % Final  . Abs Immature Granulocytes 03/31/2019 0.03  0.00 - 0.07 K/uL Final   Performed at [facility], [address], [place], [place] [address]  . Sodium 03/31/2019 140  135 - 145 mmol/L Final  . Potassium 03/31/2019 3.8  3.5 - 5.1 mmol/L Final  . Chloride 03/31/2019 108  98 - 111 mmol/L Final  . CO2 03/31/2019 24  22 - 32 mmol/L Final  . Glucose, Bld 03/31/2019 90  70 - 99 mg/dL Final  . BUN 03/31/2019 11  8 - 23 mg/dL Final  . Creatinine, Ser 03/31/2019 0.76  0.44 - 1.00 mg/dL Final  . Calcium 03/31/2019 8.8* 8.9 - 10.3 mg/dL Final  . Total Protein 03/31/2019 6.6  6.5 - 8.1 g/dL Final  . Albumin 03/31/2019 3.6  3.5 -  5.0 g/dL Final  . AST 03/31/2019 16  15 - 41 U/L Final  . ALT 03/31/2019 14  0 -  44 U/L Final  . Alkaline Phosphatase 03/31/2019 49  38 - 126 U/L Final  . Total Bilirubin 03/31/2019 0.8  0.3 - 1.2 mg/dL Final  . GFR calc non Af Amer 03/31/2019 >60  >60 mL/min Final  . GFR calc Af Amer 03/31/2019 >60  >60 mL/min Final  . Anion gap 03/31/2019 8  5 - 15 Final   Performed at [facility], [address] [place], [place] [address]    Assessment:  Sheila Suarez is a 63 y.o. female withlupusx 8 years. CT angiogram of the chest, abdominal and pelvis on 02/17/2016 revealed a 1.1 cm spiculated lesion in the left upper lobeincreased from prior exam (08/30/2010). There were stable small pulmonary nodules bilaterally (2-3 mm). She has a 15 pack year smoking history.  PET scanon 02/15/2016 revealed an irregular 1.0 cm apical LUL pulmonary nodule (SUV 0.7). There was a 4 mm right middle lobe solid pulmonary nodule (stable since 08/02/2010) and two additional right lower lobe 3 mm pulmonary nodules. PFTs revealed an FEV1 was 84% and her DLCO was 67%.  She underwent left thoracotomy with left upper lobe lobectomyon 04/17/2016. Pathologyrevealed subpleural fibro-elastotic scarconsistent with benign pulmonary cap. There was no evidence of malignancy. There were 2 benign lymph nodes. She had a pneumothorax requiring chest tube placement.  She has received the series of rabies injectionsafter being bitten by a stray dog. EGD and colonoscopywere normal on 06/02/2016. 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 There was no dysplasia or malignancy. Shewas treatedfor H pylori beginning11/18/2017.  EGDon 05/31/2018 revealed normal examined duodenum, normal esophagus, and gastritis. Pathology revealed moderate reactive gastropathy, negative for inflammation, H. pylori, metaplasia, dysplasia, or malignancy. Colonoscopyon 06/02/2016  revealed no abnormal findings.  Abdomen and pelvis CTon 08/02/2018 revealed no acute findings. There was a 1.5 cm lesion in the right lobe of the liver c/w an hemangioma. Chest CTon 12/24/2018 revealed no acute findings s/p left upper lobe lobectomy. There was a stable benign 30mm pulmonary nodule in the right upper lobe.  PET scan on 03/05/2019 revealed no evidence of recurrent or metastatic disease.   Bilateral screening mammogram on 03/06/2019 revealed a possible asymmetry in the left breast.  Diagnostic mammogram and ultrasound on 03/17/2019 revealed no focal abnormality in the outer left breast or retroareolar region.  Labs on 07/07/2020included: hematocrit 42.7, hemoglobin 14.1, MCV 93, platelets 515,000, WBC 11,900 (ANC 8900; ALC 2100). TSH was 0.512. Normal studiesincluded: Creatinine(1.030) andLFTs.  Work-up on 02/10/2019:hematocrit 44.8, hemoglobin 14.4, MCV 97.4, platelets 421,000, WBC 9,700. LDH 135, sed rate 1, CRP <0.8. Free T4 0.75. Urinalysis was negative. Peripheral smear showed high normal leukocyte count with unremarkable morphology, normochromic, normocytic erythrocytes, and mild thrombocytosis with unremarkable morphology.   Symptomatically, she is fatigued.  She notes family stressors.  Weight is down 5 pounds.  Plan: 1. Unexplained weight loss Clinically, she continues to struggle with weight loss.             CT scans and PET scan revealed no evidence of malignancy.             She stopped taking Megace. EGD on 05/31/2018 revealed no evidence of malignancy. Colonoscopy on 06/02/2016 revealed no evidence of malignancy. Mammogram and ultrasound on 03/17/2019 revealed no evidence of disease.             TSH was normal on 02/04/2019.Free T4was normal on 02/10/2019. Patient declinedHIV testing. Lupus remains quiescent. 2.   RN to call patient and ask about food choices from  pantry. 3.   RN to also provide Ensure/Boost. 4.   Please weight patient when she comes in. 5.   RTC monthly x 2 for weight check. 6.   RTC in 3 months for MD assessment, labs (CBC with diff, CMP, ferritin, B12, folate).  I discussed the assessment and treatment plan with the patient.  The patient was provided an opportunity to ask questions and all were answered.  The patient agreed with the plan and demonstrated an understanding of the instructions.  The patient was advised to call back or seek an in person evaluation if the symptoms worsen or if the condition fails to improve as anticipated.  I provided 13 minutes (9:42 AM - 9:55 AM) of non face-to-face video visit time during this this encounter and > 50% was spent counseling as documented under my assessment and plan.  I provided these services from the [facility] office.   Nolon Stalls, MD, PhD  06/23/2019, 9:42 AM  I, Selena Batten, am acting as scribe for [employer]. Mike Gip, MD, PhD.  I, Melissa C. Mike Gip, MD, have reviewed the above documentation for accuracy and completeness, and I agree with the above.

## 2019-06-23 ENCOUNTER — Encounter: Payer: Self-pay | Admitting: Hematology and Oncology

## 2019-06-23 ENCOUNTER — Inpatient Hospital Stay: Payer: Medicare Other

## 2019-06-23 ENCOUNTER — Other Ambulatory Visit: Payer: Self-pay

## 2019-06-23 ENCOUNTER — Telehealth: Payer: Self-pay

## 2019-06-23 ENCOUNTER — Inpatient Hospital Stay: Payer: Medicare Other | Attending: Hematology and Oncology | Admitting: Hematology and Oncology

## 2019-06-23 DIAGNOSIS — R634 Abnormal weight loss: Secondary | ICD-10-CM

## 2019-06-23 DIAGNOSIS — D72829 Elevated white blood cell count, unspecified: Secondary | ICD-10-CM | POA: Diagnosis not present

## 2019-06-23 NOTE — Telephone Encounter (Signed)
The patient was in office today and weight was 48.55 kg. The patient was given ensure and a bag of food.

## 2019-07-03 ENCOUNTER — Inpatient Hospital Stay: Payer: Medicare Other | Attending: Hematology and Oncology

## 2019-07-03 NOTE — Progress Notes (Signed)
Nutrition  Called patient for nutrition follow-up.  No answer. Left message with call back number.  Saoirse Legere B. Zenia Resides, [place], [place] Registered Dietitian [phone] (pager)

## 2019-07-14 ENCOUNTER — Telehealth: Payer: Self-pay

## 2019-07-14 NOTE — Telephone Encounter (Signed)
Nutrition Follow-up:   Patient with left upper lobectomy (benign).  Followed by Dr. Mike Gip.    Patient returned RD's call and left message.  RD able to call patient back today and speak with patient.  Patient reports that appetite is maybe a little bit better. Reports that she is taking megace daily and has been for the last few weeks.  Reports that daughter's surgery will be in January 2021.  Reports feeling bloated. Reports having bowel movement daily.  Reports that she has been trying to eat 2 eggs and grits for breakfast or 2 hard boiled eggs, sometimes will cook potatoes with onion and breakfast meats.  Lunch is usually a sandwich and supper she prepares meat and couple of side dishes.  Has been trying to drink 3 ensure plus per day (sometimes with meals).      Medications: megace, linzess  Labs: reviewed  Anthropometrics:   Weight noted on 11/23 in Dr. Kem Parkinson office 106 lb increased slightly from 104 lb on 10/26 but decreased from 109 lb on 9/28   Estimated Energy Needs  Kcals: 7672-0947 Protein: 70-82 g Fluid: > 1.6 L  NUTRITION DIAGNOSIS: Unintentional weight loss improved    INTERVENTION:  Encouraged patient to continue megace daily Recommend 3 meals per day with at least 2-3 snacks which can be oral nutrition supplements. Reviewed how to put meal together.  Encouraged keeping food diary and monitoring calories daily. Recommend daily MVI Reviewed ways to increase calories and protein in current eating pattern. Offered additional case of ensure enlive.  Patient will pick up tomorrow 12/15 Patient has contact information    MONITORING, EVALUATION, GOAL: Patient will consume adequate calories to prevent weight loss   NEXT VISIT: Jan 25 phone f/u  Sheila Suarez, [place], [place] Registered Dietitian [phone] (pager)

## 2019-07-21 ENCOUNTER — Inpatient Hospital Stay: Payer: Medicare Other

## 2019-07-22 ENCOUNTER — Other Ambulatory Visit: Payer: Self-pay

## 2019-07-23 ENCOUNTER — Inpatient Hospital Stay: Payer: Medicare Other

## 2019-08-11 ENCOUNTER — Other Ambulatory Visit: Payer: Self-pay | Admitting: Internal Medicine

## 2019-08-11 MED ORDER — ANORO ELLIPTA 62.5-25 MCG/INH IN AEPB: 1.0000 | INHALATION_SPRAY | Freq: Every day | RESPIRATORY_TRACT | 2 refills | Status: DC

## 2019-08-11 NOTE — Telephone Encounter (Signed)
Go for it Recommend seeing CDC guidelines on line as well

## 2019-08-11 NOTE — Telephone Encounter (Signed)
Called and spoke to pt, who is questioning if it is safe to take covid vaccine with her lung condition? Rx for Anoro has been sent to preferred pharmacy. Pt is aware and voiced her understanding.   Dr. Mortimer Fries please advise on covid vaccine. Thanks

## 2019-08-12 NOTE — Telephone Encounter (Signed)
Pt is aware of below message and voiced her understanding. Nothing further is needed. 

## 2019-08-15 ENCOUNTER — Other Ambulatory Visit: Payer: Self-pay

## 2019-08-18 ENCOUNTER — Inpatient Hospital Stay: Payer: Medicare Other

## 2019-08-25 ENCOUNTER — Inpatient Hospital Stay: Payer: Medicare Other | Attending: Hematology and Oncology

## 2019-08-25 DIAGNOSIS — H18413 Arcus senilis, bilateral: Secondary | ICD-10-CM | POA: Diagnosis not present

## 2019-08-25 DIAGNOSIS — H5203 Hypermetropia, bilateral: Secondary | ICD-10-CM | POA: Diagnosis not present

## 2019-08-25 DIAGNOSIS — H01025 Squamous blepharitis left lower eyelid: Secondary | ICD-10-CM | POA: Diagnosis not present

## 2019-08-25 DIAGNOSIS — H524 Presbyopia: Secondary | ICD-10-CM | POA: Diagnosis not present

## 2019-08-25 DIAGNOSIS — H2513 Age-related nuclear cataract, bilateral: Secondary | ICD-10-CM | POA: Diagnosis not present

## 2019-08-25 DIAGNOSIS — H43811 Vitreous degeneration, right eye: Secondary | ICD-10-CM | POA: Diagnosis not present

## 2019-08-25 DIAGNOSIS — H52223 Regular astigmatism, bilateral: Secondary | ICD-10-CM | POA: Diagnosis not present

## 2019-08-25 DIAGNOSIS — H01022 Squamous blepharitis right lower eyelid: Secondary | ICD-10-CM | POA: Diagnosis not present

## 2019-08-25 NOTE — Progress Notes (Signed)
Nutrition Follow-up:   Patient with left upper lobectomy (benign). Followed by Dr. Mike Gip.  Spoke with patient via phone for nutrition follow-up.  Reports that she has been taking the megace daily as prescribed.  Reports that appetite is better and she has been waking up at night and wanting to eat.  She has been snacking on potato chips, cookies, fruit, 1/2 sandwich.  Reports that she has been trying to eat 3 meals per day and drinking about 4 ensure per day.  Reports that bowels are moving better after started trying to eat more fruits.      Medications: reviewed  Labs: no new labs  Anthropometrics:   No new weight.  Reports that she cancelled weight check with Dr. Mike Gip due to coming in contact with sister who tested positive for covid.  Patient reports that she did not get covid.  Thinks she has gained weight  NUTRITION DIAGNOSIS: Unintentional weight loss hopefully improving   INTERVENTION:  Patient to continue to take megace daily as prescribed. Recommend patient consume 3 meals (at least 2-3 different food groups) per day and 2-3 snacks per day.  Oral nutrition supplements can be snacks.   Reviewed well balanced meals including protein, fruits, vegetables, grains.  Case of ensure enlive left at registration for patient to pick up.   Patient has contact information    MONITORING, EVALUATION, GOAL:  Patient will consume adequate calories to prevent weight loss  NEXT VISIT: Feb 22 phone f/u  Sheila Suarez B. Zenia Resides, [place], [place] Registered Dietitian [phone] (pager)

## 2019-08-27 IMAGING — MG DIGITAL DIAGNOSTIC UNILATERAL LEFT MAMMOGRAM WITH TOMO AND CAD
6 series · 6 of 18 positions shown · non-contrast
Comparison: None.

CLINICAL DATA: Recall from screening to evaluate possible left
asymmetries.

EXAM:
DIGITAL DIAGNOSTIC left MAMMOGRAM WITH TOMO
ULTRASOUND left BREAST

[L MLO synth-2D]
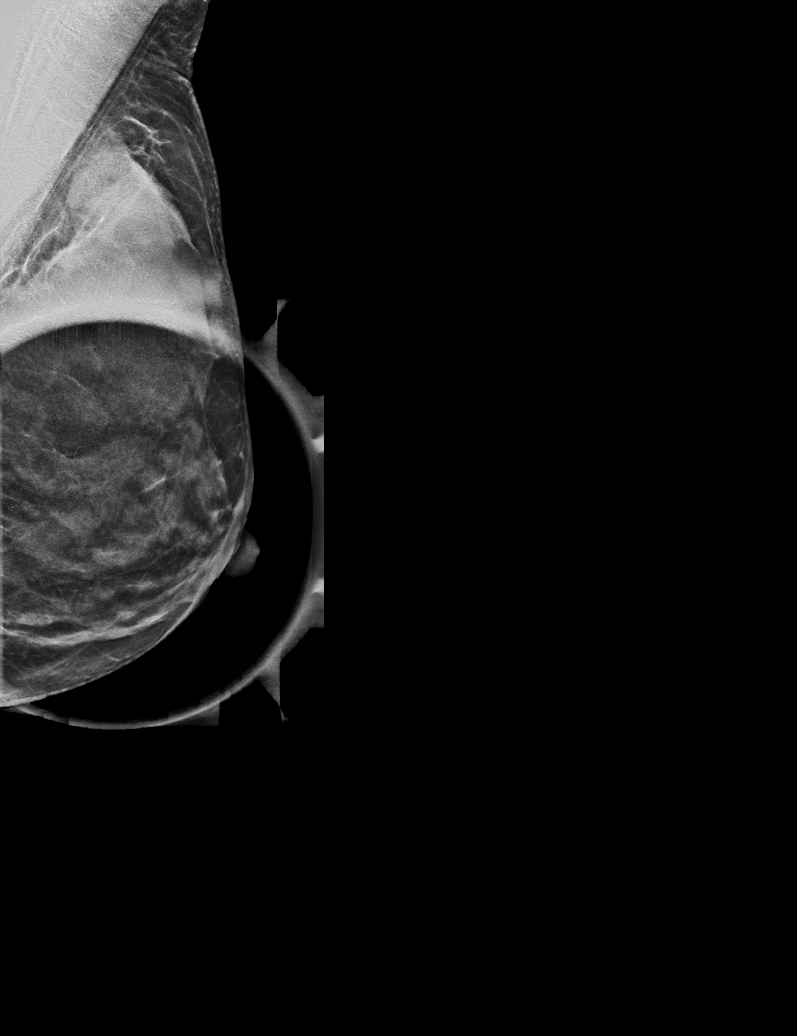

[L ML synth-2D]
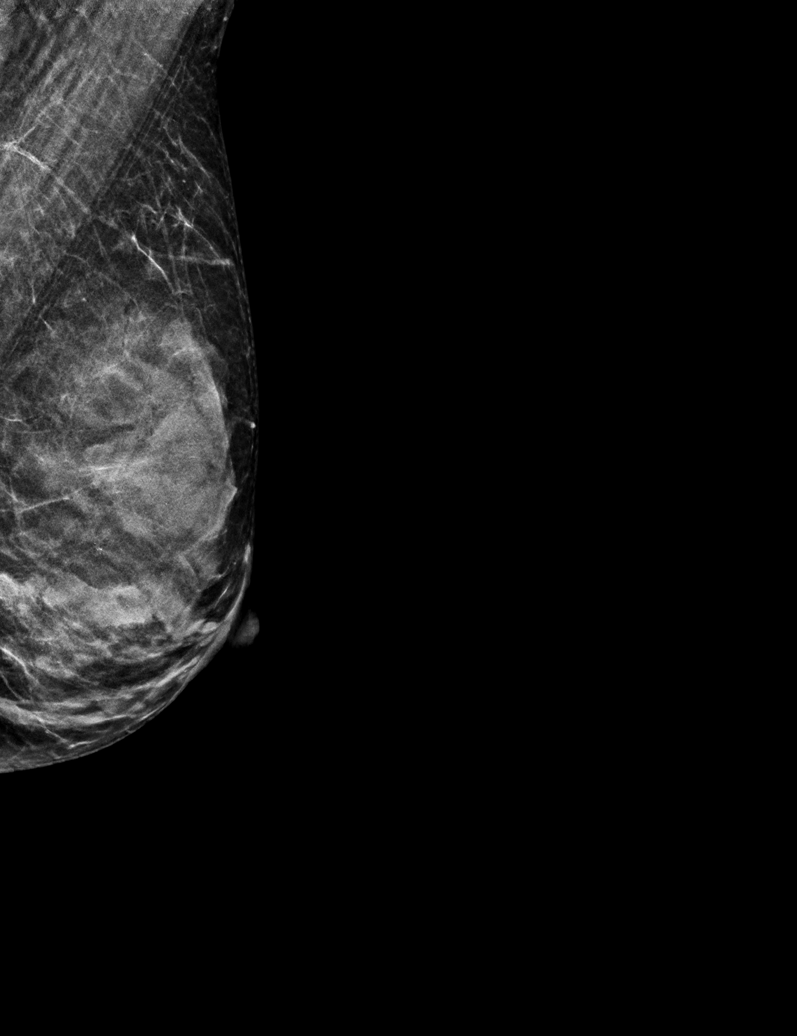

[L CC synth-2D]
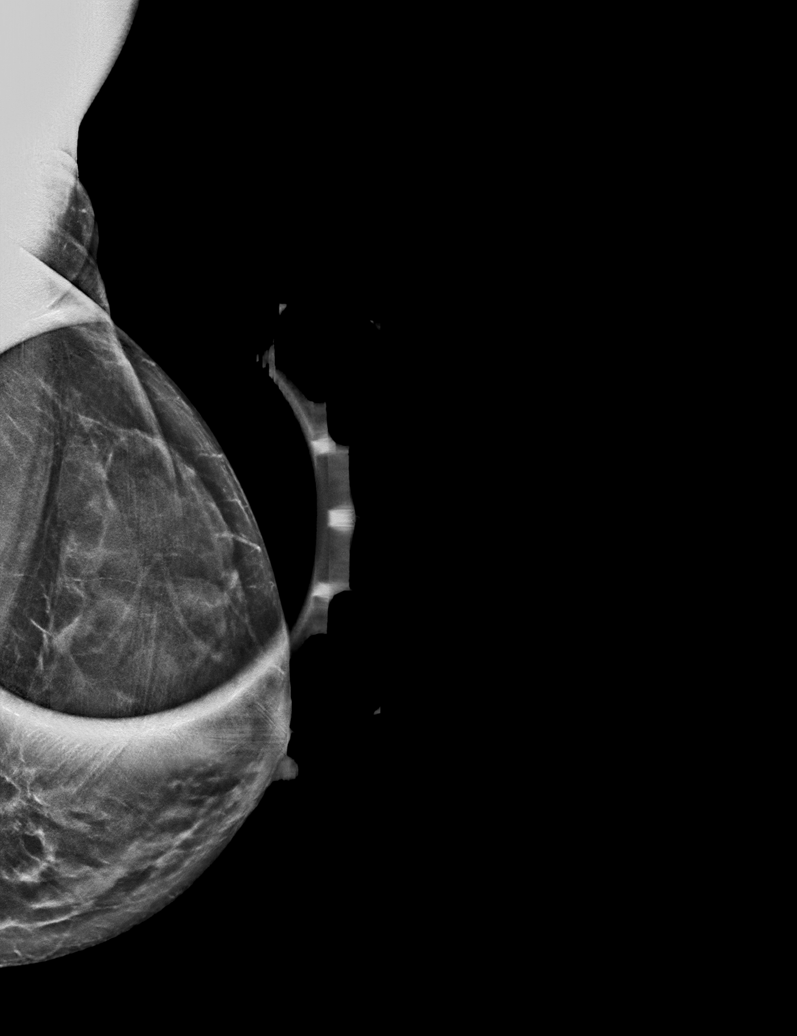

[L ML tomo · tomo slice 17/33.0]
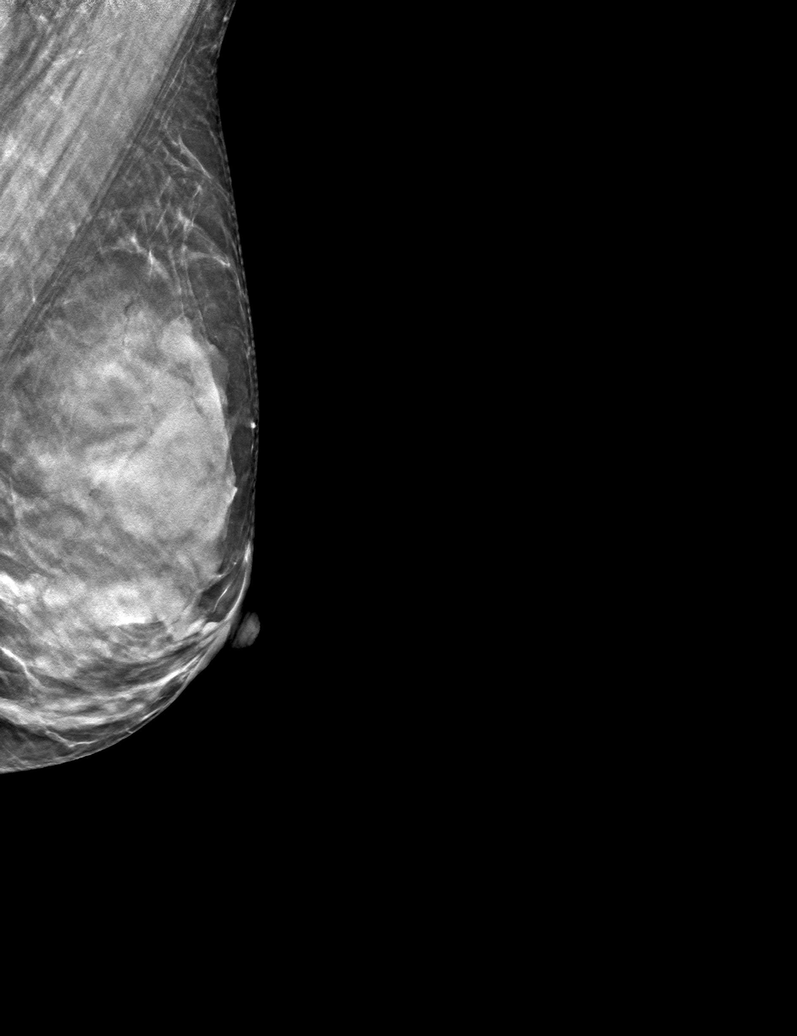

[L CC tomo · tomo slice 23/46.0]
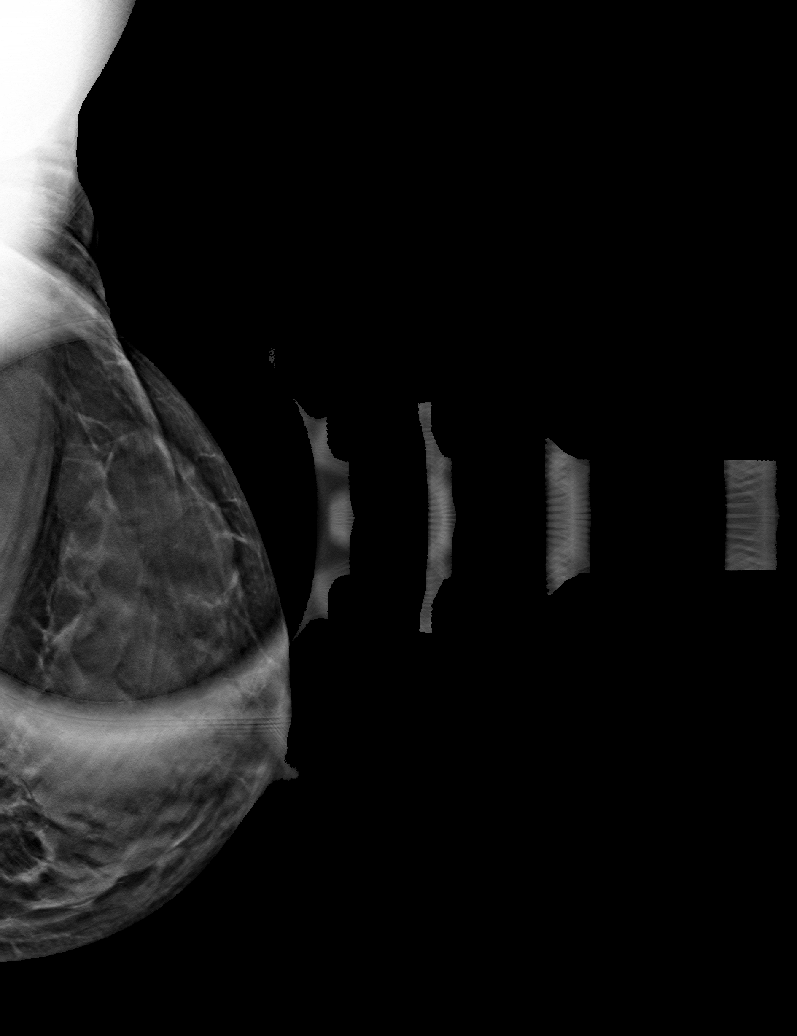

[L MLO tomo · tomo slice 19/37.0]
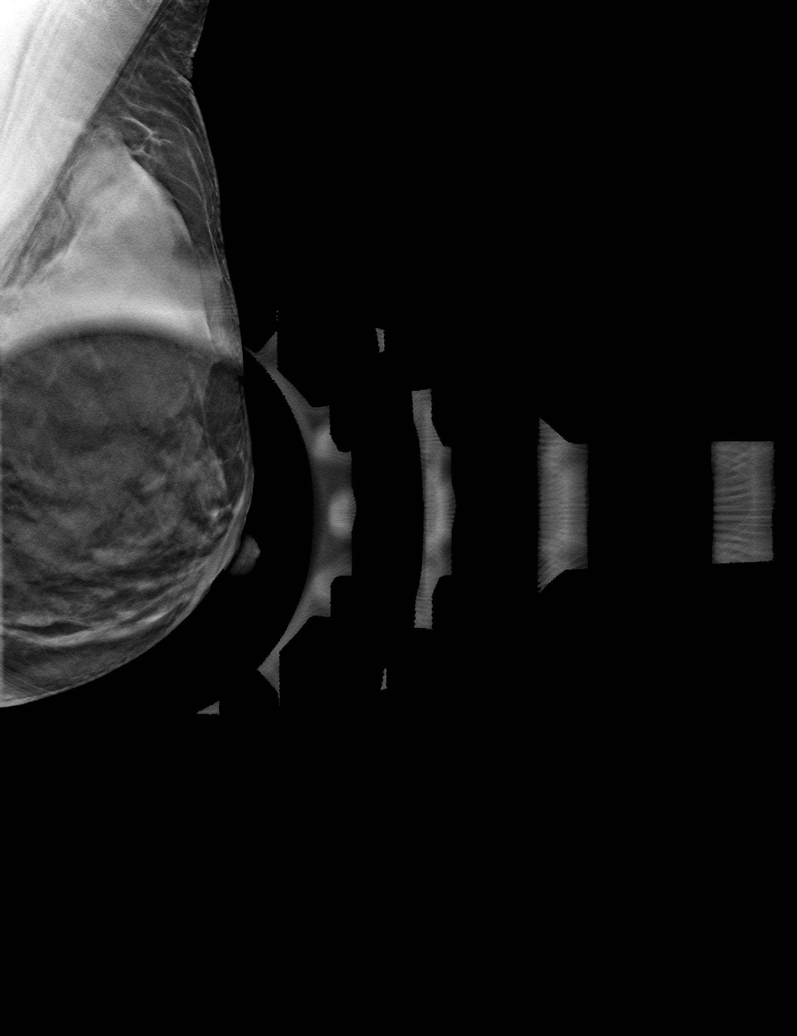

[6 of 18 positions shown; findings below may reference images not displayed]

ACR Breast Density Category d: The breast tissue is extremely dense,
which lowers the sensitivity of mammography.
FINDINGS: Spot compression and true lateral images were obtained. The
questionable asymmetry in the retroareolar region as well as
posterior third of the outer left breast do not persist on
additional imaging and are compatible with overlapping dense
fibroglandular tissue. The possible asymmetry described in the
middle third of the outer left breast as screening is again noted on
the spot compression CC image although not seen on the spot
compression MLO nor true lateral images. This likely represents
overlapping dense fibroglandular tissue.

Targeted ultrasound is performed, showing no focal abnormality over
the entire outer half of the left breast and retroareolar region.
IMPRESSION: Extremely dense fibroglandular tissue with no focal abnormality
identified over the outer breast nor retroareolar region.

RECOMMENDATION:
Recommend continued annual bilateral screening mammographic
follow-up.

I have discussed the findings and recommendations with the patient.
Results were also provided in writing at the conclusion of the
visit. If applicable, a reminder letter will be sent to the patient
regarding the next appointment.

BI-RADS CATEGORY  1: Negative.

## 2019-09-15 ENCOUNTER — Inpatient Hospital Stay: Payer: Medicare Other

## 2019-09-15 ENCOUNTER — Inpatient Hospital Stay: Payer: Medicare Other | Attending: Hematology and Oncology | Admitting: Hematology and Oncology

## 2019-09-15 DIAGNOSIS — R634 Abnormal weight loss: Secondary | ICD-10-CM | POA: Insufficient documentation

## 2019-09-15 DIAGNOSIS — R911 Solitary pulmonary nodule: Secondary | ICD-10-CM | POA: Insufficient documentation

## 2019-09-22 ENCOUNTER — Inpatient Hospital Stay: Payer: Medicare Other

## 2019-09-22 NOTE — Progress Notes (Signed)
Nutrition  Called patient for nutrition follow-up.  No answer.  Left message with call back number.    Chart reviewed.  Noted no recent weight recorded in chart.   Brenlyn Beshara B. Zenia Resides, [place], [place] Registered Dietitian [phone] (pager)

## 2019-09-24 NOTE — Progress Notes (Signed)
[facility]  [address] [place], [place] [address] Phone: [phone]  Fax: [phone]   Telephone Visit:  09/26/2019  Referring physician: Letta Median, MD  I connected with Lorayne Marek on 09/26/2019 at 1:51 PM by telephoneand verified that I was speaking with the correct person using 2 identifiers.  The patient was at home.  I discussed the limitations, risk, security and privacy concerns of performing an evaluation and management service by telephone and the availability of in person appointments.  I also discussed with the patient that there may be a patient responsible charge related to this service.  The patient expressed understanding and agreed to proceed.   Chief Complaint: Sheila Suarez is a 64 y.o. female with lupus s/p left upper lobe lobectomy(benign)and weight losswho is seen for3 month assessment.  HPI: The patient was last seen in the hematology clinic on 06/23/2019 via telephone. At that time, she was fatigued. She noted family stressors. Weight was down 5 pounds. Hematocrit 39.4, hemoglobin 12.7, platelets 449,000, WBC 9,700. Calcium was 8.8. Ensure and Boost were recommended.   Patient had a telephone visit with Jennet Maduro, RD on 08/25/2019. She was taking Megace daily. Her appetite was better. She was trying to eat 3 meals a day with snacks inbetween. She was drinking 4 Ensure per day. She recommended 2-3 snacks per day and continue megace daily.   Labs on 09/25/2019 included hematocrit 43.4, hemoglobin 13.5, platelets 422,000, WBC 7,700. Ferritin was 56. CMP was normal. Vitamin B12 was 459 with folate 15.3.   During the interim, she has felt "ok". She reports weight gain; she weighs 108 pounds. She is eating well. She has 3 meals a day with snacks in between. She is drinking 4 Ensures per day. She denies any nausea, vomiting or diarrhea.   She has back pain as she has not received her epidural yet. She notes  cataracts in her left eye and her right eye are being monitored. She denies any headaches. She has no issues with balance.   She will see Dr. Mortimer Fries in 11/2019. The patient would like to return to the clinic as needed.    Past Medical History:  Diagnosis Date  . Cancer ([place])    lung  . Coronary artery disease   . Emphysema of lung ([place])   . GERD (gastroesophageal reflux disease)   . Lupus ([place])   . 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 Shortness of breath dyspnea   . Sinus infection    9/19    Past Surgical History:  Procedure Laterality Date  . COLONOSCOPY WITH PROPOFOL N/A 06/02/2016   Procedure: COLONOSCOPY WITH PROPOFOL;  Surgeon: [name], MD;  Location: [facility] ENDOSCOPY;  Service: Gastroenterology;  Laterality: N/A;  . CYST EXCISION Left    hand  . ESOPHAGOGASTRODUODENOSCOPY (EGD) WITH PROPOFOL N/A 06/02/2016   Procedure: ESOPHAGOGASTRODUODENOSCOPY (EGD) WITH PROPOFOL;  Surgeon: [name], MD;  Location: [facility] ENDOSCOPY;  Service: Gastroenterology;  Laterality: N/A;  . ESOPHAGOGASTRODUODENOSCOPY (EGD) WITH PROPOFOL N/A 05/31/2018   Procedure: ESOPHAGOGASTRODUODENOSCOPY (EGD) WITH PROPOFOL;  Surgeon: [name], MD;  Location: [facility] ENDOSCOPY;  Service: Gastroenterology;  Laterality: N/A;  . FLEXIBLE BRONCHOSCOPY N/A 04/17/2016   Procedure: FLEXIBLE BRONCHOSCOPY;  Surgeon: [name], MD;  Location: [facility] ORS;  Service: Thoracic;  Laterality: N/A;  . HAND SURGERY Left   . left upper lobectomy Left 03/2016  . LUNG REMOVAL, PARTIAL Left    Left lower lobe  . THORACOTOMY Left 04/17/2016  Procedure: THORACOTOMY LEFT UPPER LOBE LUNG RESECTION;  Surgeon: [name], MD;  Location: [facility] ORS;  Service: Thoracic;  Laterality: Left;  . TUBAL LIGATION Bilateral    37 years ago    Family History  Problem Relation Age of Onset  . Pancreatic cancer Sister   . Lung cancer Sister   . Lung cancer Brother   . Stomach cancer Paternal Grandmother   . Heart  disease Father   . Cancer Daughter 51       cervical  . Cancer Maternal Aunt        brain  . Cancer Maternal Uncle        prostate  . Cancer Maternal Aunt        bone    Social History:  reports that she quit smoking about 3 years ago. Her smoking use included cigarettes. She has a 8.75 pack-year smoking history. She has never used smokeless tobacco. She reports current alcohol use. She reports that she does not use drugs. She has a 8.75 pack-year smoking history. She quit smokeless tobacco use about 6 months ago. She reports that she does not drink alcohol or use drugs.She works in [place] [employer] in [employer], [employer]. She lives in [place]. The patients does not have smart phone; her daughter can be reached at [phone]). The patient is accompanied by her daughter today.  Participants in the patient's visit and their role in the encounter included the patient, her daughter, and Vito Berger, CMA, today.  The intake visit was provided by Vito Berger, CMA.  Allergies: No Known Allergies  Current Medications: Current Outpatient Medications  Medication Sig Dispense Refill  . gabapentin (NEURONTIN) 100 MG capsule Take 1 capsule (100 mg total) by mouth at bedtime. May take a second capsule at bedside if necessary 60 capsule 2  . lidocaine (LIDODERM) 5 % Place 1 patch onto the skin daily. Remove & Discard patch within 12 hours or as directed by MD 30 patch 0  . linaclotide (LINZESS) 72 MCG capsule Take 72 mcg by mouth daily before breakfast.     . megestrol (MEGACE) 400 MG/10ML suspension Take 5 mLs (200 mg total) by mouth daily. 240 mL 0  . sucralfate (CARAFATE) 1 g tablet Take 1 tablet (1 g total) by mouth 4 (four) times daily. 244 tablet 0  . umeclidinium-vilanterol (ANORO ELLIPTA) 62.5-25 MCG/INH AEPB Inhale 1 puff into the lungs daily. 1 each 2  . albuterol (PROVENTIL HFA;VENTOLIN HFA) 108 (90 Base) MCG/ACT inhaler Inhale 1-2 puffs into the lungs every 4 (four) hours as needed  for wheezing or shortness of breath. (Patient not taking: Reported on 06/23/2019) 1 Inhaler 12  . amoxicillin-clavulanate (AUGMENTIN) 875-125 MG tablet Take 1 tablet by mouth 2 (two) times daily. (Patient not taking: Reported on 09/25/2019) 14 tablet 0  . chlorhexidine (PERIDEX) 0.12 % solution Use as directed 10 mLs in the mouth or throat 2 (two) times daily. Swish and spit (Patient not taking: Reported on 09/25/2019) 120 mL 0  . cyclobenzaprine (FLEXERIL) 5 MG tablet Take 1-2 tablets 3 times daily as needed (Patient not taking: Reported on 06/23/2019) 20 tablet 0  . lactulose (CEPHULAC) 20 g packet Take 1 packet (20 g total) by mouth 3 (three) times daily. (Patient not taking: Reported on 06/23/2019) 30 each 0   No current facility-administered medications for this visit.     Review of Systems  Constitutional: Negative for chills, diaphoresis, fever, malaise/fatigue and weight loss (gaining, weighs 108 pounds).  Feels "ok".  HENT: Negative.  Negative for congestion, hearing loss, nosebleeds, sinus pain and sore throat.   Eyes: Negative for blurred vision, double vision and pain.       Poor vision (near and far).  Cataracts in left eye; right eye is being monitored.  Respiratory: Negative.  Negative for cough, sputum production, shortness of breath and wheezing.        S/p left upper lobe lobectomy.  Cardiovascular: Negative.  Negative for chest pain, palpitations, orthopnea, leg swelling and PND.  Gastrointestinal: Negative for abdominal pain, blood in stool, constipation, diarrhea, heartburn, melena, nausea and vomiting.       Eating well. Snacking. Drinking Ensure QID.  Genitourinary: Negative.  Negative for dysuria, frequency, hematuria and urgency.  Musculoskeletal: Positive for back pain. Negative for joint pain and myalgias.  Skin: Negative.  Negative for rash.  Neurological: Negative for dizziness, tingling, sensory change, speech change, focal weakness, weakness and headaches.    Endo/Heme/Allergies: Negative.  Does not bruise/bleed easily.  Psychiatric/Behavioral: Negative.  Negative for depression and memory loss. The patient is not nervous/anxious and does not have insomnia.   All other systems reviewed and are negative.   Performance status (ECOG): 1   Appointment on 09/25/2019  Component Date Value Ref Range Status  . Vitamin B-12 09/25/2019 459  180 - 914 pg/mL Final   Comment: (NOTE) This assay is not validated for testing neonatal or myeloproliferative syndrome specimens for Vitamin B12 levels. Performed at [facility], [place] [address], [place], [place] [address]   . Folate 09/25/2019 15.3  >5.9 ng/mL Final   Performed at [facility] [address] [place], [place] [address]  . Ferritin 09/25/2019 56  11 - 307 ng/mL Final   Performed at [facility], [address] [place], [place] [address]  . Sodium 09/25/2019 142  135 - 145 mmol/L Final  . Potassium 09/25/2019 5.1  3.5 - 5.1 mmol/L Final  . Chloride 09/25/2019 107  98 - 111 mmol/L Final  . CO2 09/25/2019 24  22 - 32 mmol/L Final  . Glucose, Bld 09/25/2019 105* 70 - 99 mg/dL Final   Glucose reference range applies only to samples taken after fasting for at least 8 hours.  . BUN 09/25/2019 16  8 - 23 mg/dL Final  . Creatinine, Ser 09/25/2019 0.79  0.44 - 1.00 mg/dL Final  . Calcium 09/25/2019 9.2  8.9 - 10.3 mg/dL Final  . Total Protein 09/25/2019 7.0  6.5 - 8.1 g/dL Final  . Albumin 09/25/2019 3.9  3.5 - 5.0 g/dL Final  . AST 09/25/2019 16  15 - 41 U/L Final  . ALT 09/25/2019 17  0 - 44 U/L Final  . Alkaline Phosphatase 09/25/2019 69  38 - 126 U/L Final  . Total Bilirubin 09/25/2019 0.5  0.3 - 1.2 mg/dL Final  . GFR calc non Af Amer 09/25/2019 >60  >60 mL/min Final  . GFR calc Af Amer 09/25/2019 >60  >60 mL/min Final  . Anion gap 09/25/2019 11  5 - 15 Final   Performed at [facility], [address], [place], [place] [address]  . WBC 09/25/2019 7.7   4.0 - 10.5 K/uL Final  . RBC 09/25/2019 4.53  3.87 - 5.11 MIL/uL Final  . Hemoglobin 09/25/2019 13.5  12.0 - 15.0 g/dL Final  . HCT 09/25/2019 43.4  36.0 - 46.0 % Final  . MCV 09/25/2019 95.8  80.0 - 100.0 fL Final  . MCH 09/25/2019 29.8  26.0 - 34.0 pg Final  . MCHC 09/25/2019 31.1  30.0 - 36.0 g/dL Final  . RDW 09/25/2019 12.6  11.5 - 15.5 % Final  . Platelets 09/25/2019 422* 150 - 400 K/uL Final  . nRBC 09/25/2019 0.0  0.0 - 0.2 % Final  . Neutrophils Relative % 09/25/2019 58  % Final  . Neutro Abs 09/25/2019 4.5  1.7 - 7.7 K/uL Final  . Lymphocytes Relative 09/25/2019 35  % Final  . Lymphs Abs 09/25/2019 2.7  0.7 - 4.0 K/uL Final  . Monocytes Relative 09/25/2019 7  % Final  . Monocytes Absolute 09/25/2019 0.5  0.1 - 1.0 K/uL Final  . Eosinophils Relative 09/25/2019 0  % Final  . Eosinophils Absolute 09/25/2019 0.0  0.0 - 0.5 K/uL Final  . Basophils Relative 09/25/2019 0  % Final  . Basophils Absolute 09/25/2019 0.0  0.0 - 0.1 K/uL Final  . Immature Granulocytes 09/25/2019 0  % Final  . Abs Immature Granulocytes 09/25/2019 0.01  0.00 - 0.07 K/uL Final   Performed at [facility], [address], [place], [place] [address]    Assessment:  Melvinia Ashby is a 64 y.o. female withlupusx 8 years. CT angiogram of the chest, abdominal and pelvis on 02/17/2016 revealed a 1.1 cm spiculated lesion in the left upper lobeincreased from prior exam (08/30/2010). There were stable small pulmonary nodules bilaterally (2-3 mm). She has a 15 pack year smoking history.  PET scanon 02/15/2016 revealed an irregular 1.0 cm apical LUL pulmonary nodule (SUV 0.7). There was a 4 mm right middle lobe solid pulmonary nodule (stable since 08/02/2010) and two additional right lower lobe 3 mm pulmonary nodules. PFTs revealed an FEV1 was 84% and her DLCO was 67%.  She underwent left thoracotomy with left upper lobe lobectomyon 04/17/2016. Pathologyrevealed subpleural fibro-elastotic  scarconsistent with benign pulmonary cap. There was no evidence of malignancy. There were 2 benign lymph nodes. She had a pneumothorax requiring chest tube placement.  She has received the series of rabies injectionsafter being bitten by a stray dog. EGD and colonoscopywere normal on 06/02/2016. 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 There was no dysplasia or malignancy. Shewas treatedfor H pylori beginning11/18/2017.  EGDon 05/31/2018 revealed normal examined duodenum, normal esophagus, and gastritis. Pathology revealed moderate reactive gastropathy, negative for inflammation, H. pylori, metaplasia, dysplasia, or malignancy. Colonoscopyon 06/02/2016 revealed no abnormal findings.  Abdomen and pelvis CTon 08/02/2018 revealed no acute findings. There was a 1.5 cm lesion in the right lobe of the liver c/w an hemangioma. Chest CTon 12/24/2018 revealed no acute findings s/p left upper lobe lobectomy. There was a stable benign 52mm pulmonary nodule in the right upper lobe.PET scanon 03/05/2019 revealed no evidence of recurrent or metastatic disease.   Bilateral screening mammogramon 03/06/2019 revealed a possible asymmetry in the left breast. Diagnostic mammogram andultrasoundon 03/17/2019 revealed no focal abnormality in the outer left breast or retroareolar region.  Labs on 07/07/2020included: hematocrit 42.7, hemoglobin 14.1, MCV 93, platelets 515,000, WBC 11,900 (ANC 8900; ALC 2100). TSH was 0.512. Normal studiesincluded: Creatinine(1.030) andLFTs.  Work-up on 02/10/2019:hematocrit 44.8, hemoglobin 14.4, MCV 97.4, platelets 421,000, WBC 9,700. LDH 135, sed rate 1, CRP <0.8. Free T4 0.75. Urinalysis was negative. Peripheral smear showed high normal leukocyte count with unremarkable morphology, normochromic, normocytic erythrocytes, and mild thrombocytosis with unremarkable morphology.  Symptomatically, she feels "ok".   She is eating well and gaining weight.  She denies any concerns.  Plan: 1.  Review labs from 09/25/2019.  2.  Unexplained weight loss Clinically, she is doing well.  Weight is stable to increasing. CT scans and PET scan revealed no evidence of malignancy. She is taking Megace and eating regularly. EGD on 05/31/2018 revealed no evidence of malignancy. Colonoscopy on 06/02/2016 revealed no evidence of malignancy. Mammogram and ultrasound on 03/17/2019 revealed no evidence of disease. TSH was normal on 02/04/2019.Free T4was normal on 02/10/2019. Patient declinedHIV testing. Lupusisquiescent. 3.   Right middle lobe nodule  PET scan on 03/05/2019 revealed a 4 mm right middle lobe nodule.   Patient notes follow-up with Dr Mortimer Fries. 4.   RTC prn.  I discussed the assessment and treatment plan with the patient.  The patient was provided an opportunity to ask questions and all were answered.  The patient agreed with the plan and demonstrated an understanding of the instructions.  The patient was advised to call back or seek an in person evaluation if the symptoms worsen or if the condition fails to improve as anticipated.  I provided 10 minutes (1:51 PM - 2:00 PM) of non face-to-face telephone time during this this encounter and > 50% was spent counseling as documented under my assessment and plan.  I provided these services from the [facility] office.   Nolon Stalls, MD, PhD  09/26/2019, 1:51 PM  I, Selena Batten, am acting as scribe for [employer]. Mike Gip, MD, PhD.  I, Tonny Isensee C. Mike Gip, MD, have reviewed the above documentation for accuracy and completeness, and I agree with the above.

## 2019-09-25 ENCOUNTER — Encounter: Payer: Self-pay | Admitting: Hematology and Oncology

## 2019-09-25 ENCOUNTER — Inpatient Hospital Stay: Payer: Medicare Other

## 2019-09-25 ENCOUNTER — Other Ambulatory Visit: Payer: Self-pay

## 2019-09-25 DIAGNOSIS — D72829 Elevated white blood cell count, unspecified: Secondary | ICD-10-CM

## 2019-09-25 DIAGNOSIS — R634 Abnormal weight loss: Secondary | ICD-10-CM

## 2019-09-25 DIAGNOSIS — R911 Solitary pulmonary nodule: Secondary | ICD-10-CM | POA: Diagnosis not present

## 2019-09-25 LAB — COMPREHENSIVE METABOLIC PANEL
ALT: 17 U/L (ref 0–44)
AST: 16 U/L (ref 15–41)
Albumin: 3.9 g/dL (ref 3.5–5.0)
Alkaline Phosphatase: 69 U/L (ref 38–126)
Anion gap: 11 (ref 5–15)
BUN: 16 mg/dL (ref 8–23)
CO2: 24 mmol/L (ref 22–32)
Calcium: 9.2 mg/dL (ref 8.9–10.3)
Chloride: 107 mmol/L (ref 98–111)
Creatinine, Ser: 0.79 mg/dL (ref 0.44–1.00)
GFR calc Af Amer: >60 mL/min (ref >60)
GFR calc non Af Amer: >60 mL/min (ref >60)
Glucose, Bld: 105 mg/dL — ABNORMAL HIGH (ref 70–99)
Potassium: 5.1 mmol/L (ref 3.5–5.1)
Sodium: 142 mmol/L (ref 135–145)
Total Bilirubin: 0.5 mg/dL (ref 0.3–1.2)
Total Protein: 7 g/dL (ref 6.5–8.1)

## 2019-09-25 LAB — CBC WITH DIFFERENTIAL/PLATELET
Abs Immature Granulocytes: 0.01 10*3/uL (ref 0.00–0.07)
Basophils Absolute: 0 10*3/uL (ref 0.0–0.1)
Basophils Relative: 0 %
Eosinophils Absolute: 0 10*3/uL (ref 0.0–0.5)
Eosinophils Relative: 0 %
HCT: 43.4 % (ref 36.0–46.0)
Hemoglobin: 13.5 g/dL (ref 12.0–15.0)
Immature Granulocytes: 0 %
Lymphocytes Relative: 35 %
Lymphs Abs: 2.7 10*3/uL (ref 0.7–4.0)
MCH: 29.8 pg (ref 26.0–34.0)
MCHC: 31.1 g/dL (ref 30.0–36.0)
MCV: 95.8 fL (ref 80.0–100.0)
Monocytes Absolute: 0.5 10*3/uL (ref 0.1–1.0)
Monocytes Relative: 7 %
Neutro Abs: 4.5 10*3/uL (ref 1.7–7.7)
Neutrophils Relative %: 58 %
Platelets: 422 10*3/uL — ABNORMAL HIGH (ref 150–400)
RBC: 4.53 MIL/uL (ref 3.87–5.11)
RDW: 12.6 % (ref 11.5–15.5)
WBC: 7.7 10*3/uL (ref 4.0–10.5)
nRBC: 0 % (ref 0.0–0.2)

## 2019-09-25 LAB — FERRITIN: Ferritin: 56 ng/mL (ref 11–307)

## 2019-09-25 LAB — FOLATE: Folate: 15.3 ng/mL (ref >5.9)

## 2019-09-25 LAB — VITAMIN B12: Vitamin B-12: 459 pg/mL (ref 180–914)

## 2019-09-25 NOTE — Progress Notes (Signed)
No new changes noted today. The patient name and DOB has been verified by phone. 

## 2019-09-26 ENCOUNTER — Inpatient Hospital Stay (HOSPITAL_BASED_OUTPATIENT_CLINIC_OR_DEPARTMENT_OTHER): Payer: Medicare Other | Admitting: Hematology and Oncology

## 2019-09-26 DIAGNOSIS — R911 Solitary pulmonary nodule: Secondary | ICD-10-CM | POA: Diagnosis not present

## 2019-09-26 DIAGNOSIS — R634 Abnormal weight loss: Secondary | ICD-10-CM | POA: Diagnosis not present

## 2019-09-27 DIAGNOSIS — R911 Solitary pulmonary nodule: Secondary | ICD-10-CM | POA: Insufficient documentation

## 2019-10-07 NOTE — Progress Notes (Signed)
This encounter was created in error - please disregard.

## 2019-10-12 ENCOUNTER — Other Ambulatory Visit: Payer: Self-pay

## 2019-10-12 ENCOUNTER — Emergency Department
Admission: EM | Admit: 2019-10-12 | Discharge: 2019-10-12 | Disposition: A | Discharge status: Home / Self Care | Payer: Medicare Other | Attending: Emergency Medicine | Admitting: Emergency Medicine

## 2019-10-12 ENCOUNTER — Emergency Department: Payer: Medicare Other

## 2019-10-12 DIAGNOSIS — I2510 Atherosclerotic heart disease of native coronary artery without angina pectoris: Secondary | ICD-10-CM | POA: Insufficient documentation

## 2019-10-12 DIAGNOSIS — J189 Pneumonia, unspecified organism: Secondary | ICD-10-CM | POA: Diagnosis not present

## 2019-10-12 DIAGNOSIS — Z85118 Personal history of other malignant neoplasm of bronchus and lung: Secondary | ICD-10-CM | POA: Diagnosis not present

## 2019-10-12 DIAGNOSIS — J449 Chronic obstructive pulmonary disease, unspecified: Secondary | ICD-10-CM | POA: Insufficient documentation

## 2019-10-12 DIAGNOSIS — Z87891 Personal history of nicotine dependence: Secondary | ICD-10-CM | POA: Diagnosis not present

## 2019-10-12 DIAGNOSIS — Z79899 Other long term (current) drug therapy: Secondary | ICD-10-CM | POA: Insufficient documentation

## 2019-10-12 DIAGNOSIS — R05 Cough: Secondary | ICD-10-CM | POA: Diagnosis not present

## 2019-10-12 DIAGNOSIS — R0602 Shortness of breath: Secondary | ICD-10-CM | POA: Diagnosis not present

## 2019-10-12 DIAGNOSIS — Z902 Acquired absence of lung [part of]: Secondary | ICD-10-CM | POA: Diagnosis not present

## 2019-10-12 DIAGNOSIS — R911 Solitary pulmonary nodule: Secondary | ICD-10-CM | POA: Insufficient documentation

## 2019-10-12 LAB — COMPREHENSIVE METABOLIC PANEL
ALT: 16 U/L (ref 0–44)
AST: 16 U/L (ref 15–41)
Albumin: 3.6 g/dL (ref 3.5–5.0)
Alkaline Phosphatase: 72 U/L (ref 38–126)
Anion gap: 9 (ref 5–15)
BUN: 7 mg/dL — ABNORMAL LOW (ref 8–23)
CO2: 27 mmol/L (ref 22–32)
Calcium: 9.1 mg/dL (ref 8.9–10.3)
Chloride: 102 mmol/L (ref 98–111)
Creatinine, Ser: 0.55 mg/dL (ref 0.44–1.00)
GFR calc Af Amer: >60 mL/min (ref >60)
GFR calc non Af Amer: >60 mL/min (ref >60)
Glucose, Bld: 91 mg/dL (ref 70–99)
Potassium: 4.2 mmol/L (ref 3.5–5.1)
Sodium: 138 mmol/L (ref 135–145)
Total Bilirubin: 0.8 mg/dL (ref 0.3–1.2)
Total Protein: 6.6 g/dL (ref 6.5–8.1)

## 2019-10-12 LAB — CBC WITH DIFFERENTIAL/PLATELET
Abs Immature Granulocytes: 0.04 K/uL (ref 0.00–0.07)
Basophils Absolute: 0 K/uL (ref 0.0–0.1)
Basophils Relative: 0 %
Eosinophils Absolute: 0 K/uL (ref 0.0–0.5)
Eosinophils Relative: 0 %
HCT: 40.1 % (ref 36.0–46.0)
Hemoglobin: 12.6 g/dL (ref 12.0–15.0)
Immature Granulocytes: 0 %
Lymphocytes Relative: 22 %
Lymphs Abs: 2.6 K/uL (ref 0.7–4.0)
MCH: 30 pg (ref 26.0–34.0)
MCHC: 31.4 g/dL (ref 30.0–36.0)
MCV: 95.5 fL (ref 80.0–100.0)
Monocytes Absolute: 0.9 K/uL (ref 0.1–1.0)
Monocytes Relative: 7 %
Neutro Abs: 8.6 K/uL — ABNORMAL HIGH (ref 1.7–7.7)
Neutrophils Relative %: 71 %
Platelets: 408 K/uL — ABNORMAL HIGH (ref 150–400)
RBC: 4.2 MIL/uL (ref 3.87–5.11)
RDW: 12.7 % (ref 11.5–15.5)
WBC: 12.2 K/uL — ABNORMAL HIGH (ref 4.0–10.5)
nRBC: 0 % (ref 0.0–0.2)

## 2019-10-12 LAB — BRAIN NATRIURETIC PEPTIDE: B Natriuretic Peptide: 48 pg/mL (ref 0.0–100.0)

## 2019-10-12 LAB — TROPONIN I (HIGH SENSITIVITY): Troponin I (High Sensitivity): <2 ng/L (ref <18)

## 2019-10-12 MED ORDER — DIPHENHYDRAMINE HCL 50 MG/ML IJ SOLN
INTRAMUSCULAR | Status: AC
  Filled 2019-10-12: qty 1

## 2019-10-12 MED ORDER — DIPHENHYDRAMINE HCL 50 MG/ML IJ SOLN
25.0000 mg | Freq: Once | INTRAMUSCULAR | Status: AC
  Administered 2019-10-12: 25 mg via INTRAVENOUS

## 2019-10-12 MED ORDER — SODIUM CHLORIDE 0.9 % IV SOLN
2.0000 g | Freq: Once | INTRAVENOUS | Status: AC
  Administered 2019-10-12: 2 g via INTRAVENOUS
  Filled 2019-10-12: qty 20

## 2019-10-12 MED ORDER — CEFPODOXIME PROXETIL 200 MG PO TABS: 200.0000 mg | ORAL_TABLET | Freq: Two times a day (BID) | ORAL | 0 refills | Status: AC

## 2019-10-12 MED ORDER — DEXAMETHASONE SODIUM PHOSPHATE 10 MG/ML IJ SOLN
10.0000 mg | Freq: Once | INTRAMUSCULAR | Status: AC
  Administered 2019-10-12: 10 mg via INTRAVENOUS
  Filled 2019-10-12: qty 1

## 2019-10-12 MED ORDER — AZITHROMYCIN 250 MG PO TABS: ORAL_TABLET | ORAL | 0 refills | Status: DC

## 2019-10-12 MED ORDER — IOHEXOL 350 MG/ML SOLN
75.0000 mL | Freq: Once | INTRAVENOUS | Status: AC | PRN
  Administered 2019-10-12: 75 mL via INTRAVENOUS

## 2019-10-12 MED ORDER — HYDROCODONE-ACETAMINOPHEN 5-325 MG PO TABS
1.0000 | ORAL_TABLET | Freq: Once | ORAL | Status: AC
  Administered 2019-10-12: 1 via ORAL
  Filled 2019-10-12: qty 1

## 2019-10-12 MED ORDER — ONDANSETRON HCL 4 MG/2ML IJ SOLN
4.0000 mg | Freq: Once | INTRAMUSCULAR | Status: AC
  Administered 2019-10-12: 4 mg via INTRAVENOUS
  Filled 2019-10-12: qty 2

## 2019-10-12 MED ORDER — HYDROCODONE-ACETAMINOPHEN 5-325 MG PO TABS: 1.0000 | ORAL_TABLET | Freq: Four times a day (QID) | ORAL | 0 refills | Status: AC | PRN

## 2019-10-12 NOTE — Discharge Instructions (Addendum)
Take the antibiotics as prescribed  Call your regular doctor or Oncologist for follow-up in 1 week

## 2019-10-12 NOTE — ED Notes (Signed)
Dr Ellender Hose at bedside

## 2019-10-12 NOTE — ED Notes (Signed)
Pt transported to CT ?

## 2019-10-12 NOTE — ED Provider Notes (Signed)
[facility] Emergency Department Provider Note  ____________________________________________   First MD Initiated Contact with Patient 10/12/19 1109     (approximate)  I have reviewed the triage vital signs and the nursing notes.   HISTORY  Chief Complaint Cough and Shortness of Breath    HPI Sheila Suarez is a 64 y.o. female  Here with cough, SOB. Pt has a long h/o lupus s/p lobectomy, here with cough and SOB. Pt endorses approximately 2-3 weeks of progressively worsening cough, wheezing, and now SOB with exertion. Pt was told that she has a "spot" on her right lung by pulmonology and has an appt in May. However, due to increasing cough and SOB, she presents today due to concern for this nodule. She has some associated mild nausea, loss of appetite but this is also a chronic, ongoing issue for which she is being followed by her PCP and specialists. She denies any known fevers. Denies any chest pain. No leg pain, swelling, or cramping. No h/o PE per her report. No recent sick contacts. No COVID contacts.       Past Medical History:  Diagnosis Date  . Cancer ([place])    lung  . Coronary artery disease   . Emphysema of lung ([place])   . GERD (gastroesophageal reflux disease)   . Lupus ([place])   . 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 Shortness of breath dyspnea   . Sinus infection    9/19    Patient Active Problem List   Diagnosis Date Noted  . Nodule of right lung 09/27/2019  . 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 DDD (degenerative disc disease), lumbar 01/13/2019  . 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 Chronic use of opiate drug for therapeutic purpose 09/19/2017  . Non-rheumatic mitral regurgitation 08/01/2017  . Leg pain, bilateral 03/19/2017  . Former heavy tobacco smoker 03/19/2017  . Arthralgia of multiple joints 08/28/2016  . Rib pain 07/26/2016  . Irritable bowel syndrome with  constipation 06/27/2016  . H. pylori infection 06/27/2016  . Abdominal pain, left upper quadrant   . Special screening for malignant neoplasms, colon   . Pneumothorax 04/28/2016  . Lung mass 04/17/2016  . CAD (coronary artery disease) 02/28/2016  . Shortness of breath 02/28/2016  . Lupus anticoagulant positive 02/21/2016  . Weight loss 02/09/2016    Past Surgical History:  Procedure Laterality Date  . COLONOSCOPY WITH PROPOFOL N/A 06/02/2016   Procedure: COLONOSCOPY WITH PROPOFOL;  Surgeon: [name], MD;  Location: [facility] ENDOSCOPY;  Service: Gastroenterology;  Laterality: N/A;  . CYST EXCISION Left    hand  . ESOPHAGOGASTRODUODENOSCOPY (EGD) WITH PROPOFOL N/A 06/02/2016   Procedure: ESOPHAGOGASTRODUODENOSCOPY (EGD) WITH PROPOFOL;  Surgeon: [name], MD;  Location: [facility] ENDOSCOPY;  Service: Gastroenterology;  Laterality: N/A;  . ESOPHAGOGASTRODUODENOSCOPY (EGD) WITH PROPOFOL N/A 05/31/2018   Procedure: ESOPHAGOGASTRODUODENOSCOPY (EGD) WITH PROPOFOL;  Surgeon: [name], MD;  Location: [facility] ENDOSCOPY;  Service: Gastroenterology;  Laterality: N/A;  . FLEXIBLE BRONCHOSCOPY N/A 04/17/2016   Procedure: FLEXIBLE BRONCHOSCOPY;  Surgeon: [name], MD;  Location: [facility] ORS;  Service: Thoracic;  Laterality: N/A;  . HAND SURGERY Left   . left upper lobectomy Left 03/2016  . LUNG REMOVAL, PARTIAL Left    Left lower lobe  . THORACOTOMY Left 04/17/2016   Procedure: THORACOTOMY LEFT UPPER LOBE LUNG RESECTION;  Surgeon: [name], MD;  Location: [facility] ORS;  Service: Thoracic;  Laterality: Left;  . TUBAL LIGATION Bilateral  37 years ago    Prior to Admission medications   Medication Sig Start Date End Date Taking? Authorizing Provider  albuterol (PROVENTIL HFA;VENTOLIN HFA) 108 (90 Base) MCG/ACT inhaler Inhale 1-2 puffs into the lungs every 4 (four) hours as needed for wheezing or shortness of breath. Patient not taking: Reported on 06/23/2019 12/06/17   Flora Lipps, MD    amoxicillin-clavulanate (AUGMENTIN) 875-125 MG tablet Take 1 tablet by mouth 2 (two) times daily. Patient not taking: Reported on 09/25/2019 04/05/19   Cuthriell, Charline Bills, PA-C  azithromycin (ZITHROMAX Z-PAK) 250 MG tablet Take 2 tablets (500 mg) on  Day 1,  followed by 1 tablet (250 mg) once daily on Days 2 through 5. 10/12/19   Duffy Bruce, MD  cefpodoxime (VANTIN) 200 MG tablet Take 1 tablet (200 mg total) by mouth 2 (two) times daily for 7 days. 10/12/19 10/19/19  Duffy Bruce, MD  chlorhexidine (PERIDEX) 0.12 % solution Use as directed 10 mLs in the mouth or throat 2 (two) times daily. Swish and spit Patient not taking: Reported on 09/25/2019 04/05/19   Cuthriell, Charline Bills, PA-C  cyclobenzaprine (FLEXERIL) 5 MG tablet Take 1-2 tablets 3 times daily as needed Patient not taking: Reported on 06/23/2019 02/07/18   Laban Emperor, PA-C  gabapentin (NEURONTIN) 100 MG capsule Take 1 capsule (100 mg total) by mouth at bedtime. May take a second capsule at bedside if necessary 01/28/19 09/25/19  Molli Barrows, MD  HYDROcodone-acetaminophen (NORCO/VICODIN) 5-325 MG tablet Take 1 tablet by mouth every 6 (six) hours as needed for severe pain. 10/12/19   Lavonia Drafts, MD  lactulose (CEPHULAC) 20 g packet Take 1 packet (20 g total) by mouth 3 (three) times daily. Patient not taking: Reported on 06/23/2019 11/28/17   [name], MD  lidocaine (LIDODERM) 5 % Place 1 patch onto the skin daily. Remove & Discard patch within 12 hours or as directed by MD 01/28/19   Molli Barrows, MD  linaclotide Rolan Lipa) 72 MCG capsule Take 72 mcg by mouth daily before breakfast.  08/14/16   [provider]  megestrol (MEGACE) 400 MG/10ML suspension Take 5 mLs (200 mg total) by mouth daily. 03/31/19   Lequita Asal, MD  sucralfate (CARAFATE) 1 g tablet Take 1 tablet (1 g total) by mouth 4 (four) times daily. 05/29/18 09/25/19  [name], MD  umeclidinium-vilanterol [facility] ELLIPTA) 62.5-25 MCG/INH AEPB Inhale 1  puff into the lungs daily. 08/11/19   Flora Lipps, MD    Allergies Patient has no known allergies.  Family History  Problem Relation Age of Onset  . Pancreatic cancer Sister   . Lung cancer Sister   . Lung cancer Brother   . Stomach cancer Paternal Grandmother   . Heart disease Father   . Cancer Daughter 12       cervical  . Cancer Maternal Aunt        brain  . Cancer Maternal Uncle        prostate  . Cancer Maternal Aunt        bone    Social History Social History   Tobacco Use  . Smoking status: Former Smoker    Packs/day: 0.25    Years: 35.00    Pack years: 8.75    Types: Cigarettes    Quit date: 03/19/2016    Years since quitting: 3.5  . Smokeless tobacco: Never Used  Substance Use Topics  . Alcohol use: Yes  . Drug use: No    Review of Systems  Review of Systems  Constitutional: Positive for fatigue. Negative for fever.  HENT: Negative for congestion and sore throat.   Eyes: Negative for visual disturbance.  Respiratory: Positive for cough, shortness of breath and wheezing.   Cardiovascular: Negative for chest pain.  Gastrointestinal: Negative for abdominal pain, diarrhea, nausea and vomiting.  Genitourinary: Negative for flank pain.  Musculoskeletal: Negative for back pain and neck pain.  Skin: Negative for rash and wound.  Neurological: Negative for weakness.  All other systems reviewed and are negative.    ____________________________________________  PHYSICAL EXAM:      VITAL SIGNS: ED Triage Vitals  Enc Vitals Group     BP 10/12/19 1030 (!) 151/88     Pulse Rate 10/12/19 1030 87     Resp 10/12/19 1030 18     Temp 10/12/19 1030 99 F (37.2 C)     Temp Source 10/12/19 1030 Oral     SpO2 10/12/19 1030 98 %     Weight 10/12/19 1031 108 lb (49 kg)     Height 10/12/19 1031 5\' 5"  (1.651 m)     Head Circumference --      Peak Flow --      Pain Score 10/12/19 1036 6     Pain Loc --      Pain Edu? --      Excl. in [place]? --      Physical  Exam Vitals and nursing note reviewed.  Constitutional:      General: She is not in acute distress.    Appearance: She is well-developed.     Comments: Non-toxic, in no acute distress, speaking in full sentences  HENT:     Head: Normocephalic and atraumatic.  Eyes:     Conjunctiva/sclera: Conjunctivae normal.  Cardiovascular:     Rate and Rhythm: Normal rate and regular rhythm.     Heart sounds: Normal heart sounds. No murmur. No friction rub.  Pulmonary:     Effort: Pulmonary effort is normal. No respiratory distress.     Breath sounds: Examination of the left-lower field reveals rales. Rales present. No wheezing.  Abdominal:     General: There is no distension.     Palpations: Abdomen is soft.     Tenderness: There is no abdominal tenderness.  Musculoskeletal:     Cervical back: Neck supple.  Skin:    General: Skin is warm.     Capillary Refill: Capillary refill takes less than 2 seconds.  Neurological:     Mental Status: She is alert and oriented to person, place, and time.     Motor: No abnormal muscle tone.       ____________________________________________   LABS (all labs ordered are listed, but only abnormal results are displayed)  Labs Reviewed  CBC WITH DIFFERENTIAL/PLATELET - Abnormal; Notable for the following components:      Result Value   WBC 12.2 (*)    Platelets 408 (*)    Neutro Abs 8.6 (*)    All other components within normal limits  COMPREHENSIVE METABOLIC PANEL - Abnormal; Notable for the following components:   BUN 7 (*)    All other components within normal limits  BRAIN NATRIURETIC PEPTIDE  TROPONIN I (HIGH SENSITIVITY)  TROPONIN I (HIGH SENSITIVITY)    ____________________________________________  EKG: Normal sinus rhythm, VR 91. PR 132, QRS 70, QTc 430. No acute ST elevation or depression.  ________________________________________  RADIOLOGY All imaging, including plain films, CT scans, and ultrasounds, independently reviewed by me,  and interpretations  confirmed via formal radiology reads.  ED MD interpretation:   CXR: Clear CT Chest: No PE. Small pulm nodules noted. Small area of PNA noted in left lower lobe.  Official radiology report(s): DG Chest 2 View  Result Date: 10/12/2019 CLINICAL DATA:  Cough and dyspnea EXAM: CHEST - 2 VIEW COMPARISON:  11/19/2016 chest radiograph. FINDINGS: Stable cardiomediastinal silhouette with normal heart size. No pneumothorax. No pleural effusion. Volume loss in the left hemithorax with surgical sutures in the left parahilar region, unchanged. No pulmonary edema. No acute consolidative airspace disease. IMPRESSION: No active cardiopulmonary disease. Stable postsurgical changes in the left hemithorax. Electronically Signed   By: Ilona Sorrel M.D.   On: 10/12/2019 12:00   CT Angio Chest PE W and/or Wo Contrast  Result Date: 10/12/2019 CLINICAL DATA:  Short of breath and cough for about 1 month. History of previous right lung surgery. EXAM: CT ANGIOGRAPHY CHEST WITH CONTRAST TECHNIQUE: Multidetector CT imaging of the chest was performed using the standard protocol during bolus administration of intravenous contrast. Multiplanar CT image reconstructions and MIPs were obtained to evaluate the vascular anatomy. CONTRAST:  48mL OMNIPAQUE IOHEXOL 350 MG/ML SOLN COMPARISON:  Chest CT, 12/24/2018 FINDINGS: Cardiovascular: There is satisfactory opacification of the pulmonary arteries to the segmental level. There is no evidence of a pulmonary embolism. Heart is normal in size and configuration. No pericardial effusion. Left coronary artery calcifications. Great vessels are normal in caliber. No aortic dissection. Minor aortic atherosclerosis. Mediastinum/Nodes: No neck base, axillary, mediastinal or hilar masses or enlarged lymph nodes. Trachea and esophagus are unremarkable. Lungs/Pleura: Status post left lower lobectomy. 4 mm nodule, right upper lobe, image 45, series 6, stable. 3 mm nodule, right lower  lobe, image 48, not visualized on the prior study. 3-4 mm ground-glass nodule, right lower lobe, image 58, not evident on the prior exam. Small area of peribronchovascular opacity in the left lung, centered on image 51. Moderate centrilobular emphysema. No pleural effusion or pneumothorax. Upper Abdomen: No acute abnormality. Musculoskeletal: No fracture or acute finding. No osteoblastic or osteolytic lesions. Review of the MIP images confirms the above findings. IMPRESSION: 1. No evidence of a pulmonary embolism. 2. Small area of peribronchovascular opacity in the left lung, not present on the prior study. Suspect a small focus of pneumonia versus is scarring developing since the prior CT. 3. Small pulmonary nodules, 2 appear new since the prior CT. No follow-up needed if patient is low-risk (and has no known or suspected primary neoplasm). Non-contrast chest CT can be considered in 12 months if patient is high-risk. This recommendation follows the consensus statement: Guidelines for Management of Incidental Pulmonary Nodules Detected on CT Images: From the Fleischner Society 2017; Radiology 2017; 284:228-243. 4. Moderate centrilobular emphysema. 5. Previous left lower lobectomy. 6. Mild aortic atherosclerosis. Aortic Atherosclerosis (ICD10-I70.0) and Emphysema (ICD10-J43.9). Electronically Signed   By: Lajean Manes M.D.   On: 10/12/2019 13:45    ____________________________________________  PROCEDURES   Procedure(s) performed (including Critical Care):  Procedures  ____________________________________________  INITIAL IMPRESSION / MDM / Power / ED COURSE  As part of my medical decision making, I reviewed the following data within the [place] notes reviewed and incorporated, Old chart reviewed, Notes from prior ED visits, and [place] Controlled Substance Database       *Sheila Suarez was evaluated in Emergency Department on 10/12/2019 for the symptoms  described in the history of present illness. She was evaluated in the context of the global COVID-19 pandemic,  which necessitated consideration that the patient might be at risk for infection with the SARS-CoV-2 virus that causes COVID-19. Institutional protocols and algorithms that pertain to the evaluation of patients at risk for COVID-19 are in a state of rapid change based on information released by regulatory bodies including the CDC and federal and state organizations. These policies and algorithms were followed during the patient's care in the ED.  Some ED evaluations and interventions may be delayed as a result of limited staffing during the pandemic.*     Medical Decision Making:  64 yo F with h/o SLE, lobectomy, here with cough and mild SOB. She is satting >95% on RA and is in NAD on exam. Lab work is very reassuring - mild leukocytosis is noted but otherwise reassuring CMP, no signs of severe sepsis on vitals, labs, or exam. CXR is clear, but given pt's comorbidities and cough >1 month, CT Angio obtained which does show small area of PNA in LLL, which would fit clinically. Note is made of pulmonary nodules as well, which were discussed with pt. Pt otherwise remains well appearing in ED, tolerating PO after Zofran, and is requesting discharge.  Will treat for CAP with broad coverage given her underlying SLE. She does have a h/o emphysema and reports wheezing, so will give single dose of decadron. Hesitant to give longer course due to baseline SLE/immunosuppresion. Otherwise, will encourage fluids, rest, and pulmonary toilet at home.  ____________________________________________  FINAL CLINICAL IMPRESSION(S) / ED DIAGNOSES  Final diagnoses:  Community acquired pneumonia of left upper lobe of lung  Pulmonary nodule     MEDICATIONS GIVEN DURING THIS VISIT:  Medications  iohexol (OMNIPAQUE) 350 MG/ML injection 75 mL (75 mLs Intravenous Contrast Given 10/12/19 1327)    HYDROcodone-acetaminophen (NORCO/VICODIN) 5-325 MG per tablet 1 tablet (1 tablet Oral Given 10/12/19 1657)  cefTRIAXone (ROCEPHIN) 2 g in sodium chloride 0.9 % 100 mL IVPB (0 g Intravenous Stopped 10/12/19 1738)  ondansetron (ZOFRAN) injection 4 mg (4 mg Intravenous Given 10/12/19 1659)  dexamethasone (DECADRON) injection 10 mg (10 mg Intravenous Given 10/12/19 1701)  diphenhydrAMINE (BENADRYL) injection 25 mg (25 mg Intravenous Given 10/12/19 1705)     ED Discharge Orders         Ordered    cefpodoxime (VANTIN) 200 MG tablet  2 times daily     10/12/19 1612    azithromycin (ZITHROMAX Z-PAK) 250 MG tablet     10/12/19 1612    HYDROcodone-acetaminophen (NORCO/VICODIN) 5-325 MG tablet  Every 6 hours PRN     10/12/19 1733           Note:  This document was prepared using Dragon voice recognition software and may include unintentional dictation errors.   Duffy Bruce, MD 10/12/19 2030

## 2019-10-12 NOTE — ED Triage Notes (Addendum)
Pt comes POV with SOB and cough for about a month. Pt states that she has a spot on her right lung and has had half her left removed for cancer. The cough is new and started about a month ago. Pt has a pulmonology appt in May. Pt also states new nosebleeds.

## 2019-10-16 ENCOUNTER — Telehealth: Payer: Self-pay

## 2019-10-16 ENCOUNTER — Telehealth: Payer: Self-pay | Admitting: Internal Medicine

## 2019-10-16 MED ORDER — AMOXICILLIN-POT CLAVULANATE 875-125 MG PO TABS: 1.0000 | ORAL_TABLET | Freq: Two times a day (BID) | ORAL | 0 refills | Status: DC

## 2019-10-16 MED ORDER — ALBUTEROL SULFATE HFA 108 (90 BASE) MCG/ACT IN AERS: 1.0000 | INHALATION_SPRAY | RESPIRATORY_TRACT | 6 refills | Status: DC | PRN

## 2019-10-16 NOTE — Telephone Encounter (Signed)
Nutrition Follow-up:  Patient with left upper lobectomy (benign).  Followed by Dr Mike Gip.  Patient returned RD's phone call left on 2/22.  Patient reports that she has recently been to ED (3/15) and diagnosed with pneumonia.  Reports that she has had a cough for about 1 month and started feeling worse.  Reports that she is own antibiotics.  Appetite has decreased due to illness.  Has been eating soups, applesauce.      Medications: reviewed  Labs: reviewed  Anthropometrics:   Weight 108 lb on 3/14 (ED), stable from 109 lb in Sept 2021   NUTRITION DIAGNOSIS: Unintentional weight loss stable   INTERVENTION:  Discussed with patient importance of eating well to fight pneumonia and prevent weight loss.  Encouraged patient to eat high calorie, high protein meals with snacks.   Continue oral nutrition supplements as able for additional calories and protein.  Patient has RD contact information and encouraged to reach out if needed   NEXT VISIT: as needed  Emerald Gehres B. Zenia Resides, [place], [place] Registered Dietitian [phone] (pager)

## 2019-10-16 NOTE — Telephone Encounter (Signed)
Hello, I DID NOT PRESRCIBE HER ABX but lets do AUGMENTIN 875 mg BID for 7 days

## 2019-10-16 NOTE — Telephone Encounter (Signed)
error 

## 2019-10-16 NOTE — Telephone Encounter (Signed)
Spoke with patient Let her know Dr. Mortimer Fries did not Rx the medication but did call this in. If she is having more issues from her ED visit to notify her PCP. If she needed further assistance with her breathing, to call us . Patient to have follow up with Dr. Mortimer Fries in May

## 2019-10-16 NOTE — Telephone Encounter (Signed)
Spoke with patient. She states she would like a new antibiotic, the one she is on is making her short of breath. She saw DK in hospital and is to follow up in May  Rx sent for refill on albuterol.   Dr. Mortimer Fries please advise on changing antibiotic

## 2019-12-18 ENCOUNTER — Ambulatory Visit: Payer: Medicare Other | Admitting: Pulmonary Disease

## 2020-01-22 ENCOUNTER — Telehealth: Payer: Self-pay

## 2020-01-22 NOTE — Telephone Encounter (Signed)
Nutrition Follow-up:   Patient with left upper lobectomy (benign). Patient has been followed by Dr. Mike Gip.    Patient called RD and left message asking for return call.  RD called patient back.  Reports that she has not been feeling well since having pneumonia in March 2021.  States that she needs to call and schedule appointment with pulmonology.  Says that her appetite is up and down.  Thinks she has lost weight.      Medications: reviewed  Labs: reviewed  Anthropometrics:   No recent weight in chart    NUTRITION DIAGNOSIS: Unintentional weight loss unknown, no recent weight recorded   INTERVENTION:  Encouraged patient to schedule follow-up with pulmonology.   Reviewed strategies that we have discussed in the past regarding ways to increase calories and protein.   Patient to contact RD as needed   NEXT VISIT: as needed  Alayha Babineaux B. Zenia Resides, [place], [place] Registered Dietitian [phone] (pager)

## 2020-04-27 ENCOUNTER — Other Ambulatory Visit: Payer: Self-pay

## 2020-04-27 ENCOUNTER — Encounter: Payer: Self-pay | Admitting: Internal Medicine

## 2020-04-27 ENCOUNTER — Ambulatory Visit (INDEPENDENT_AMBULATORY_CARE_PROVIDER_SITE_OTHER): Payer: Medicare Other | Admitting: Internal Medicine

## 2020-04-27 ENCOUNTER — Telehealth: Payer: Self-pay | Admitting: Internal Medicine

## 2020-04-27 VITALS — BP 106/70 | HR 59 | Temp 97.1°F | Ht 65.0 in | Wt 109.4 lb

## 2020-04-27 DIAGNOSIS — J449 Chronic obstructive pulmonary disease, unspecified: Secondary | ICD-10-CM

## 2020-04-27 DIAGNOSIS — F419 Anxiety disorder, unspecified: Secondary | ICD-10-CM

## 2020-04-27 MED ORDER — ALBUTEROL SULFATE HFA 108 (90 BASE) MCG/ACT IN AERS: 1.0000 | INHALATION_SPRAY | RESPIRATORY_TRACT | 6 refills | Status: DC | PRN

## 2020-04-27 MED ORDER — ANORO ELLIPTA 62.5-25 MCG/INH IN AEPB: 1.0000 | INHALATION_SPRAY | Freq: Every day | RESPIRATORY_TRACT | 2 refills | Status: DC

## 2020-04-27 MED ORDER — ALPRAZOLAM 0.25 MG PO TABS: 0.2500 mg | ORAL_TABLET | Freq: Every evening | ORAL | 0 refills | Status: AC | PRN

## 2020-04-27 NOTE — Progress Notes (Signed)
[facility] Consultation      Date: 04/27/2020,   MRN# [id number] Sheila Suarez 1955-12-21     AdmissionWeight: 109 lb 6.4 oz (49.6 kg)                 CurrentWeight: 109 lb 6.4 oz (49.6 kg) Sheila Suarez is a 64 y.o. old female seen in consultation for SOB at the request of Dr. Genevive Bi   SYNOPSIS 47 AAF with chronic tobacco abuse Quit smoking 03/2016 Had dx of LUL nodule since 2012 Underwent LUL lung resection by Dr Genevive Bi 03/2016 as requested by oncology Benign findings per path reports  I have advised her to call her Cardiologist PFT ratio 78%-fef25/75 435predicted mild obstruction Moderate restriction-NL DLCO when corrected for volumes Her ONO and 6MWT WNL no evidence of hypoxia     CHIEF COMPLAINT:   Follow up SOB and DOE Follow up lung resection benign     HISTORY OF PRESENT ILLNESS   Patient has chronic shortness of breath dysregulation which seems  to be stable But may have progressed over the last 6 months No wheezing no productive cough No fevers no chills Doing relatively okay with Anoro    No exacerbation at this time No evidence of heart failure at this time No evidence or signs of infection at this time No respiratory distress No fevers, chills, nausea, vomiting, diarrhea No evidence of lower extremity edema No evidence hemoptysis   PFT PFT   Current Medication:  Current Outpatient Medications:  .  albuterol (VENTOLIN HFA) 108 (90 Base) MCG/ACT inhaler, Inhale 1-2 puffs into the lungs every 4 (four) hours as needed for wheezing or shortness of breath., Disp: 8 g, Rfl: 6 .  linaclotide (LINZESS) 72 MCG capsule, Take 72 mcg by mouth daily before breakfast. , Disp: , Rfl:  .  sucralfate (CARAFATE) 1 g tablet, Take 1 tablet (1 g total) by mouth 4 (four) times daily., Disp: 244 tablet, Rfl: 0 .  umeclidinium-vilanterol (ANORO ELLIPTA) 62.5-25 MCG/INH AEPB, Inhale 1 puff into the lungs daily., Disp: 1 each, Rfl: 2 .   amoxicillin-clavulanate (AUGMENTIN) 875-125 MG tablet, Take 1 tablet by mouth 2 (two) times daily. (Patient not taking: Reported on 09/25/2019), Disp: 14 tablet, Rfl: 0 .  amoxicillin-clavulanate (AUGMENTIN) 875-125 MG tablet, Take 1 tablet by mouth 2 (two) times daily. (Patient not taking: Reported on 04/27/2020), Disp: 14 tablet, Rfl: 0 .  azithromycin (ZITHROMAX Z-PAK) 250 MG tablet, Take 2 tablets (500 mg) on  Day 1,  followed by 1 tablet (250 mg) once daily on Days 2 through 5. (Patient not taking: Reported on 04/27/2020), Disp: 6 each, Rfl: 0 .  chlorhexidine (PERIDEX) 0.12 % solution, Use as directed 10 mLs in the mouth or throat 2 (two) times daily. Swish and spit (Patient not taking: Reported on 09/25/2019), Disp: 120 mL, Rfl: 0 .  cyclobenzaprine (FLEXERIL) 5 MG tablet, Take 1-2 tablets 3 times daily as needed (Patient not taking: Reported on 06/23/2019), Disp: 20 tablet, Rfl: 0 .  gabapentin (NEURONTIN) 100 MG capsule, Take 1 capsule (100 mg total) by mouth at bedtime. May take a second capsule at bedside if necessary, Disp: 60 capsule, Rfl: 2 .  HYDROcodone-acetaminophen (NORCO/VICODIN) 5-325 MG tablet, Take 1 tablet by mouth every 6 (six) hours as needed for severe pain. (Patient not taking: Reported on 04/27/2020), Disp: 10 tablet, Rfl: 0 .  lactulose (CEPHULAC) 20 g packet, Take 1 packet (20 g total) by mouth 3 (three) times daily. (Patient not taking: Reported  on 06/23/2019), Disp: 30 each, Rfl: 0 .  lidocaine (LIDODERM) 5 %, Place 1 patch onto the skin daily. Remove & Discard patch within 12 hours or as directed by MD (Patient not taking: Reported on 04/27/2020), Disp: 30 patch, Rfl: 0 .  megestrol (MEGACE) 400 MG/10ML suspension, Take 5 mLs (200 mg total) by mouth daily. (Patient not taking: Reported on 04/27/2020), Disp: 240 mL, Rfl: 0    ALLERGIES   Patient has no known allergies.     REVIEW OF SYSTEMS   Review of Systems  Constitutional: Positive for malaise/fatigue. Negative for  chills, diaphoresis, fever and weight loss.  HENT: Negative for congestion and hearing loss.   Eyes: Negative for blurred vision and double vision.  Respiratory: Negative for cough, hemoptysis, sputum production, shortness of breath and wheezing.   Cardiovascular: Positive for chest pain. Negative for palpitations, orthopnea and leg swelling.  Gastrointestinal: Negative for abdominal pain, heartburn, nausea and vomiting.  Genitourinary: Negative for dysuria and urgency.  Musculoskeletal: Negative for back pain, myalgias and neck pain.  Skin: Negative for rash.  Neurological: Negative for dizziness, tingling, tremors, weakness and headaches.  Endo/Heme/Allergies: Does not bruise/bleed easily.  Psychiatric/Behavioral: The patient is nervous/anxious.   All other systems reviewed and are negative.   BP 106/70 (BP Location: Left Arm, Patient Position: Sitting, Cuff Size: Normal)   Pulse (!) 59   Temp (!) 97.1 F (36.2 C) (Temporal)   Ht 5\' 5"  (1.651 m)   Wt 109 lb 6.4 oz (49.6 kg)   SpO2 100%   BMI 18.21 kg/m     PHYSICAL EXAM  Physical Exam Constitutional:      General: She is not in acute distress.    Appearance: She is well-developed. She is not diaphoretic.  HENT:     Head: Normocephalic and atraumatic.     Mouth/Throat:     Pharynx: No oropharyngeal exudate.  Eyes:     General: No scleral icterus.    Pupils: Pupils are equal, round, and reactive to light.  Cardiovascular:     Rate and Rhythm: Normal rate and regular rhythm.     Heart sounds: Normal heart sounds. No murmur heard.   Pulmonary:     Effort: No respiratory distress.     Breath sounds: No stridor. No wheezing.  Abdominal:     General: Bowel sounds are normal.     Palpations: Abdomen is soft.  Musculoskeletal:        General: Normal range of motion.     Cervical back: Normal range of motion and neck supple.  Skin:    General: Skin is warm.  Neurological:     Mental Status: She is alert and oriented to  person, place, and time.     Cranial Nerves: No cranial nerve deficit.       IMAGING  CT chest 10/2016 I have Independently reviewed images of  CT chest    Interpretation:no acute findings, no effusions, no massess  PFT 08/29/16  Mod restrictive Lung disease FVC 63% predicted 2L FEF 25/75 53% predicted  Mixed mild obstructive small airways disease  CT chest 11/2018  [email or web address] CT chest was Independently Reviewed By Me Today reviewed-5MM nodule unchanged in 2 years    ASSESSMENT/PLAN   64 year old pleasant thin African-American female follow-up shortness of breath with exertion with history of chronic tobacco abuse with history of left upper lobe nodule status post left upper lobe lung resection with benign findings consistent with intermittent reactive airways disease with small airways disease  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 Andree Elk  No evidence of exacerbation at this time  Follow-up CT chest with Dr. Mike Gip pending History of benign lung nodule  Xanax as needed for insomnia   COVID-19 EDUCATION: The signs and symptoms of COVID-19 were discussed with the patient and how to seek care for testing (follow up with PCP or arrange E-visit).  The importance of social distancing was discussed today.  MEDICATION ADJUSTMENTS/LABS AND TESTS ORDERED:  CURRENT MEDICATIONS REVIEWED AT LENGTH WITH PATIENT TODAY Continue inhalers as prescribed Avoid secondhand smoke exposure Follow-up pain management clinic Follow-up Dr. Mike Gip Xanax as needed for insomnia Check  6-minute walk test  Patient  satisfied with Plan of action and management. All questions answered  Follow up 1 year   Total time spent 34 minutes   Corrin Parker, M.D.  [facility]  Medical Director [place] Director [facility] Cardio-Pulmonary Department

## 2020-04-27 NOTE — Telephone Encounter (Signed)
Per Dr. Mortimer Fries verbally- do not suspect that sx are related to vaccine. Recommend ED if sx worsen.  Patient is aware of recommendations and voiced her understanding.  Nothing further needed.

## 2020-04-27 NOTE — Patient Instructions (Addendum)
Continue inhalers as prescribed Avoid secondhand smoke exposure Follow-up pain management clinic Follow-up Dr. Mike Gip Xanax as needed for insomnia Check 6-minute walk test

## 2020-04-27 NOTE — Telephone Encounter (Signed)
Called and spoke to patient.  Patient stated that she received J&J vaccine today after her visit with Dr. Mortimer Fries.  She stated one hour after receiving vaccine, she developed nausea and runny nose.  Denied fever, chills, sweats, sob or additional symptoms.   Patient stated that if her symptoms continue, she will go to ED.

## 2020-04-27 NOTE — Telephone Encounter (Signed)
Vaccine usually does not react and give symptoms with one hr.  Recommend ER visit is symptoms persist. Thank you.

## 2020-05-03 ENCOUNTER — Other Ambulatory Visit: Payer: Self-pay

## 2020-05-03 ENCOUNTER — Ambulatory Visit (INDEPENDENT_AMBULATORY_CARE_PROVIDER_SITE_OTHER): Payer: Medicare Other

## 2020-05-03 DIAGNOSIS — J449 Chronic obstructive pulmonary disease, unspecified: Secondary | ICD-10-CM

## 2020-12-14 ENCOUNTER — Telehealth: Payer: Self-pay | Admitting: Internal Medicine

## 2020-12-14 MED ORDER — PREDNISONE 10 MG (21) PO TBPK: ORAL_TABLET | ORAL | 0 refills | Status: DC

## 2020-12-14 NOTE — Telephone Encounter (Signed)
Patient is aware of recommendations and voiced her understanding.  Rx for prednisone has been sent to preferred pharmacy. Nothing further needed at this time.

## 2020-12-14 NOTE — Telephone Encounter (Signed)
Reached out to patient via telephone.  Patient sated that she cleaned with bleach and it effected her breathing.  She reports of increased sob, runny nose, chest congestion, prod cough with yellow sputum and wheezing. Sx developed on Saturday. Denied fever, chills or sweats.  She is using anoro once daily and albuterol BID. She can not tolerate abx due to yeast infections.  Dr. Patsey Berthold, please advise. Dr. Mortimer Fries is unavailable. Thanks

## 2020-12-14 NOTE — Telephone Encounter (Signed)
Prednisone taper package of 10 mg tablets #21 as directed in package.  If her symptoms worsen or she develops fever she needs to be seen at either urgent care or ED.

## 2021-05-23 ENCOUNTER — Encounter: Payer: Self-pay | Admitting: Internal Medicine

## 2021-05-23 ENCOUNTER — Other Ambulatory Visit: Payer: Self-pay

## 2021-05-23 ENCOUNTER — Other Ambulatory Visit
Admission: RE | Admit: 2021-05-23 | Discharge: 2021-05-23 | Disposition: A | Discharge status: Home / Self Care | Payer: Medicare Other | Source: Ambulatory Visit | Attending: Internal Medicine | Admitting: Internal Medicine

## 2021-05-23 ENCOUNTER — Ambulatory Visit (INDEPENDENT_AMBULATORY_CARE_PROVIDER_SITE_OTHER): Payer: Medicare Other | Admitting: Internal Medicine

## 2021-05-23 VITALS — BP 128/74 | HR 56 | Temp 97.1°F | Ht 65.0 in | Wt 109.4 lb

## 2021-05-23 DIAGNOSIS — R9389 Abnormal findings on diagnostic imaging of other specified body structures: Secondary | ICD-10-CM

## 2021-05-23 DIAGNOSIS — J449 Chronic obstructive pulmonary disease, unspecified: Secondary | ICD-10-CM | POA: Diagnosis not present

## 2021-05-23 DIAGNOSIS — Z20822 Contact with and (suspected) exposure to covid-19: Secondary | ICD-10-CM | POA: Diagnosis not present

## 2021-05-23 DIAGNOSIS — Z01812 Encounter for preprocedural laboratory examination: Secondary | ICD-10-CM | POA: Diagnosis present

## 2021-05-23 LAB — SARS CORONAVIRUS 2 (TAT 6-24 HRS): SARS Coronavirus 2: NEGATIVE

## 2021-05-23 MED ORDER — ANORO ELLIPTA 62.5-25 MCG/ACT IN AEPB: 1.0000 | INHALATION_SPRAY | Freq: Every day | RESPIRATORY_TRACT | 10 refills | Status: AC

## 2021-05-23 MED ORDER — PREDNISONE 10 MG PO TABS: 10.0000 mg | ORAL_TABLET | Freq: Every day | ORAL | 0 refills | Status: DC

## 2021-05-23 MED ORDER — PREDNISONE 10 MG PO TABS: 10.0000 mg | ORAL_TABLET | Freq: Every day | ORAL | 1 refills | Status: DC

## 2021-05-23 MED ORDER — ALBUTEROL SULFATE HFA 108 (90 BASE) MCG/ACT IN AERS: 1.0000 | INHALATION_SPRAY | RESPIRATORY_TRACT | 6 refills | Status: DC | PRN

## 2021-05-23 NOTE — Patient Instructions (Addendum)
Continue inhalers as prescribed Avoid secondhand smoke exposure Follow-up pain management clinic Prednisone 10 mg daily for 7 days as needed OBTAIN pulmonary function test to assess lung function

## 2021-05-23 NOTE — Addendum Note (Signed)
Addended by: Claudette Head A on: 05/23/2021 02:50 PM   Modules accepted: Orders

## 2021-05-23 NOTE — Progress Notes (Signed)
[facility] Consultation      Date: 05/23/2021,   MRN# [id number] Sheila Suarez 23-Nov-1955     Admission                  Current  Sheila Suarez is a 65 y.o. old female seen in consultation for SOB at the request of Dr. Genevive Bi   SYNOPSIS 57 AAF with chronic tobacco abuse Quit smoking 03/2016 Had dx of LUL nodule since 2012 Underwent LUL lung resection by Dr Genevive Bi 03/2016 as requested by oncology Benign findings per path reports  I have advised her to call her Cardiologist PFT ratio 78%-fef25/75 435predicted mild obstruction Moderate restriction-NL DLCO when corrected for volumes Her ONO and 6MWT WNL no evidence of hypoxia     CHIEF COMPLAINT:   Follow up SOB and DOE Follow up lung resection benign      HISTORY OF PRESENT ILLNESS   Chronic shortness of breath   chronic dyspnea on exertion   constant exposure to secondhand smoke exposure     No exacerbation at this time No evidence of heart failure at this time No evidence or signs of infection at this time No respiratory distress No fevers, chills, nausea, vomiting, diarrhea No evidence of lower extremity edema No evidence hemoptysis  COPD stable Continue anoro as prescribed   Current Medication:  Current Outpatient Medications:    albuterol (VENTOLIN HFA) 108 (90 Base) MCG/ACT inhaler, Inhale 1-2 puffs into the lungs every 4 (four) hours as needed for wheezing or shortness of breath., Disp: 8 g, Rfl: 6   chlorhexidine (PERIDEX) 0.12 % solution, Use as directed 10 mLs in the mouth or throat 2 (two) times daily. Swish and spit (Patient not taking: Reported on 09/25/2019), Disp: 120 mL, Rfl: 0   cyclobenzaprine (FLEXERIL) 5 MG tablet, Take 1-2 tablets 3 times daily as needed (Patient not taking: Reported on 06/23/2019), Disp: 20 tablet, Rfl: 0   gabapentin (NEURONTIN) 100 MG capsule, Take 1 capsule (100 mg total) by mouth at bedtime. May take a second capsule at bedside if necessary,  Disp: 60 capsule, Rfl: 2   HYDROcodone-acetaminophen (NORCO/VICODIN) 5-325 MG tablet, Take 1 tablet by mouth every 6 (six) hours as needed for severe pain. (Patient not taking: Reported on 04/27/2020), Disp: 10 tablet, Rfl: 0   lactulose (CEPHULAC) 20 g packet, Take 1 packet (20 g total) by mouth 3 (three) times daily. (Patient not taking: Reported on 06/23/2019), Disp: 30 each, Rfl: 0   lidocaine (LIDODERM) 5 %, Place 1 patch onto the skin daily. Remove & Discard patch within 12 hours or as directed by MD (Patient not taking: Reported on 04/27/2020), Disp: 30 patch, Rfl: 0   linaclotide (LINZESS) 72 MCG capsule, Take 72 mcg by mouth daily before breakfast. , Disp: , Rfl:    megestrol (MEGACE) 400 MG/10ML suspension, Take 5 mLs (200 mg total) by mouth daily. (Patient not taking: Reported on 04/27/2020), Disp: 240 mL, Rfl: 0   predniSONE (STERAPRED UNI-PAK 21 TAB) 10 MG (21) TBPK tablet, Use as directed., Disp: 21 each, Rfl: 0   sucralfate (CARAFATE) 1 g tablet, Take 1 tablet (1 g total) by mouth 4 (four) times daily., Disp: 244 tablet, Rfl: 0   umeclidinium-vilanterol (ANORO ELLIPTA) 62.5-25 MCG/INH AEPB, Inhale 1 puff into the lungs daily., Disp: 1 each, Rfl: 2    ALLERGIES   Patient has no known allergies.    Review of Systems:  Gen:  Denies  fever, sweats, chills weight loss  HEENT:  Denies blurred vision, double vision, ear pain, eye pain, hearing loss, nose bleeds, sore throat Cardiac:  No dizziness, chest pain or heaviness, chest tightness,edema, No JVD Resp:   No cough, -sputum production, +shortness of breath,-wheezing, -hemoptysis,  Other:  All other systems negative  BP 128/74 (BP Location: Left Arm, Patient Position: Sitting, Cuff Size: Small)   Pulse (!) 56   Temp (!) 97.1 F (36.2 C) (Oral)   Ht 5\' 5"  (1.651 m)   Wt 109 lb 6.4 oz (49.6 kg)   SpO2 99%   BMI 18.21 kg/m    Physical Examination:   General Appearance: No distress  EYES PERRLA, EOM intact.   NECK Supple,  No JVD Pulmonary: normal breath sounds, No wheezing.  ALL OTHER ROS ARE NEGATIVE     IMAGING  CT chest 10/2016 I have Independently reviewed images of  CT chest    Interpretation:no acute findings, no effusions, no massess  PFT 08/29/16  Mod restrictive Lung disease FVC 63% predicted 2L FEF 25/75 53% predicted Mixed mild obstructive small airways disease   CT chest 11/2018  .[email or web address] CT chest was Independently Reviewed  reviewed-5MM nodule unchanged in 2 years    ASSESSMENT/PLAN   65 year old African-American female follow-up shortness of breath with exertion history of chronic tobacco abuse with a history of left upper lobe nodule s/p resection with benign pathology with intermittent reactive airways disease with small airways disease with mild COPD 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   Shortness of breath and dyspnea exertion stable at the moment 6-minute walk test negative Previous pulse oximetry nocturnal was negative Repeat PFT;s needed to assess for interval changes Has extensive second hand smoke exposure  Gold stage a COPD Continue Anoro Albuterol as needed Avoid allergens Avoid secondhand smoke No exacerbation at this time No evidence of heart failure at this time No evidence or signs of infection at this time No respiratory distress No fevers, chills, nausea, vomiting, diarrhea No evidence of lower extremity edema No evidence hemoptysis Prednisone as needed   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 Andree Elk   CURRENT MEDICATIONS REVIEWED AT LENGTH WITH PATIENT TODAY Continue inhalers as prescribed Avoid secondhand smoke exposure Follow-up pain management clinic Prednisone 10 mg daily for 7 days as needed OBTAIN pulmonary function test to assess lung function  Patient  satisfied with Plan of action and management. All questions answered  Follow up 1 Year  Total time spent 33 mins   Corrin Parker, M.D.   [facility]  Medical Director [place] Director [facility] Cardio-Pulmonary Department

## 2021-05-24 ENCOUNTER — Ambulatory Visit: Payer: Medicare Other | Attending: Internal Medicine

## 2021-05-24 DIAGNOSIS — J449 Chronic obstructive pulmonary disease, unspecified: Secondary | ICD-10-CM

## 2021-05-24 DIAGNOSIS — R9389 Abnormal findings on diagnostic imaging of other specified body structures: Secondary | ICD-10-CM

## 2021-05-24 MED ORDER — ALBUTEROL SULFATE (2.5 MG/3ML) 0.083% IN NEBU
2.5000 mg | INHALATION_SOLUTION | Freq: Once | RESPIRATORY_TRACT | Status: AC
  Administered 2021-05-24: 2.5 mg via RESPIRATORY_TRACT
  Filled 2021-05-24: qty 3

## 2021-06-12 ENCOUNTER — Other Ambulatory Visit: Payer: Self-pay

## 2021-06-12 ENCOUNTER — Emergency Department
Admission: EM | Admit: 2021-06-12 | Discharge: 2021-06-12 | Disposition: A | Discharge status: Home / Self Care | Payer: Medicare Other | Attending: Emergency Medicine | Admitting: Emergency Medicine

## 2021-06-12 ENCOUNTER — Emergency Department: Payer: Medicare Other

## 2021-06-12 DIAGNOSIS — R079 Chest pain, unspecified: Secondary | ICD-10-CM | POA: Diagnosis not present

## 2021-06-12 DIAGNOSIS — I2510 Atherosclerotic heart disease of native coronary artery without angina pectoris: Secondary | ICD-10-CM | POA: Insufficient documentation

## 2021-06-12 DIAGNOSIS — M62838 Other muscle spasm: Secondary | ICD-10-CM

## 2021-06-12 DIAGNOSIS — M25512 Pain in left shoulder: Secondary | ICD-10-CM | POA: Diagnosis not present

## 2021-06-12 DIAGNOSIS — M79602 Pain in left arm: Secondary | ICD-10-CM | POA: Diagnosis present

## 2021-06-12 DIAGNOSIS — J449 Chronic obstructive pulmonary disease, unspecified: Secondary | ICD-10-CM | POA: Insufficient documentation

## 2021-06-12 DIAGNOSIS — Z7951 Long term (current) use of inhaled steroids: Secondary | ICD-10-CM | POA: Diagnosis not present

## 2021-06-12 DIAGNOSIS — M542 Cervicalgia: Secondary | ICD-10-CM | POA: Insufficient documentation

## 2021-06-12 DIAGNOSIS — Z85118 Personal history of other malignant neoplasm of bronchus and lung: Secondary | ICD-10-CM | POA: Diagnosis not present

## 2021-06-12 DIAGNOSIS — Z87891 Personal history of nicotine dependence: Secondary | ICD-10-CM | POA: Insufficient documentation

## 2021-06-12 LAB — BASIC METABOLIC PANEL
Anion gap: 6 (ref 5–15)
BUN: 13 mg/dL (ref 8–23)
CO2: 27 mmol/L (ref 22–32)
Calcium: 9.1 mg/dL (ref 8.9–10.3)
Chloride: 110 mmol/L (ref 98–111)
Creatinine, Ser: 0.72 mg/dL (ref 0.44–1.00)
GFR, Estimated: >60 mL/min (ref >60)
Glucose, Bld: 130 mg/dL — ABNORMAL HIGH (ref 70–99)
Potassium: 3.7 mmol/L (ref 3.5–5.1)
Sodium: 143 mmol/L (ref 135–145)

## 2021-06-12 LAB — CBC
HCT: 39.4 % (ref 36.0–46.0)
Hemoglobin: 12.3 g/dL (ref 12.0–15.0)
MCH: 29.3 pg (ref 26.0–34.0)
MCHC: 31.2 g/dL (ref 30.0–36.0)
MCV: 93.8 fL (ref 80.0–100.0)
Platelets: 412 10*3/uL — ABNORMAL HIGH (ref 150–400)
RBC: 4.2 MIL/uL (ref 3.87–5.11)
RDW: 12.7 % (ref 11.5–15.5)
WBC: 8.4 10*3/uL (ref 4.0–10.5)
nRBC: 0 % (ref 0.0–0.2)

## 2021-06-12 LAB — TROPONIN I (HIGH SENSITIVITY): Troponin I (High Sensitivity): 3 ng/L (ref <18)

## 2021-06-12 MED ORDER — LIDOCAINE 5 % EX PTCH: 1.0000 | MEDICATED_PATCH | Freq: Two times a day (BID) | CUTANEOUS | 0 refills | Status: AC

## 2021-06-12 NOTE — Discharge Instructions (Signed)
Use Tylenol for pain and fevers.  Up to 1000 mg per dose, up to 4 times per day.  Do not take more than 4000 mg of Tylenol/acetaminophen within 24 hours..  Please use lidocaine patches and your site of pain.  Apply 1 patch at a time, leave on for 12 hours, then remove for 12 hours.  12 hours on, 12 hours off.  Do not apply more than 1 patch at a time.

## 2021-06-12 NOTE — ED Provider Notes (Signed)
[facility] Emergency Department Provider Note ____________________________________________   Event Date/Time   First MD Initiated Contact with Patient 06/12/21 1338     (approximate)  I have reviewed the triage vital signs and the nursing notes.  HISTORY  Chief Complaint Arm Pain and Chest Pain   HPI Sheila Suarez is a 65 y.o. femalewho presents to the ED for evaluation of acute on chronic chest and arm pain.   Chart review indicates smoker with COPD and LUL lung resection in 2017.  No chronic oxygen.  CAD.  Patient presents to the ED for evaluation of chronic, multiple months, of left-sided neck, shoulder and arm pain.  She reports constant pain making it difficult to sleep, aching pain radiating from her left shoulder down her left arm, from her left-sided paraspinal thoracic back around her flank towards her left-sided chest.  She reports the pain was worse last night, make it difficult to sleep.  So she got a small amount of crack cocaine from a friend and taped it to her shoulder with a piece of plastic tape.  She reports sleeping well after this.  She presents to the ED today for evaluation of this pain.  She has not taken any medications yet today.  She was concerned that it might be her lung due to her history of lobectomy in the past.  She denies fever, productive cough, chest pain with exertion, abdominal pain, nausea or emesis.  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 pylori infection 06/27/2016   Abdominal pain, left upper quadrant    Special screening for malignant neoplasms, colon    Pneumothorax 04/28/2016   Lung mass 04/17/2016   CAD (coronary artery disease) 02/28/2016   Shortness of breath 02/28/2016   Lupus anticoagulant positive 02/21/2016   Weight loss 02/09/2016    Past Surgical History:  Procedure Laterality Date   COLONOSCOPY WITH PROPOFOL N/A 06/02/2016   Procedure: COLONOSCOPY WITH PROPOFOL;  Surgeon: [name], MD;  Location: [facility] ENDOSCOPY;  Service: Gastroenterology;  Laterality: N/A;   CYST EXCISION Left    hand   ESOPHAGOGASTRODUODENOSCOPY (EGD) WITH PROPOFOL N/A 06/02/2016   Procedure: ESOPHAGOGASTRODUODENOSCOPY (EGD) WITH PROPOFOL;  Surgeon: [name], MD;  Location: [facility] ENDOSCOPY;  Service: Gastroenterology;  Laterality: N/A;   ESOPHAGOGASTRODUODENOSCOPY (EGD) WITH PROPOFOL N/A 05/31/2018   Procedure: ESOPHAGOGASTRODUODENOSCOPY (EGD) WITH PROPOFOL;  Surgeon: [name], MD;  Location: [facility] ENDOSCOPY;  Service: Gastroenterology;  Laterality: N/A;   FLEXIBLE BRONCHOSCOPY N/A 04/17/2016   Procedure: FLEXIBLE BRONCHOSCOPY;  Surgeon: [name], MD;  Location: [facility] ORS;  Service: Thoracic;  Laterality: N/A;   HAND  SURGERY Left    left upper lobectomy Left 03/2016   LUNG REMOVAL, PARTIAL Left    Left lower lobe   THORACOTOMY Left 04/17/2016   Procedure: THORACOTOMY LEFT UPPER LOBE LUNG RESECTION;  Surgeon: [name], MD;  Location: [facility] ORS;  Service: Thoracic;  Laterality: Left;   TUBAL LIGATION Bilateral    37 years ago    Prior to Admission medications   Medication Sig Start  Date End Date Taking? Authorizing Provider  lidocaine (LIDODERM) 5 % Place 1 patch onto the skin every 12 (twelve) hours. Remove & Discard patch within 12 hours or as directed by MD 06/12/21 06/12/22 Yes Vladimir Crofts, MD  albuterol (VENTOLIN HFA) 108 (90 Base) MCG/ACT inhaler Inhale 1-2 puffs into the lungs every 4 (four) hours as needed for wheezing or shortness of breath. 05/23/21   Flora Lipps, MD  chlorhexidine (PERIDEX) 0.12 % solution Use as directed 10 mLs in the mouth or throat 2 (two) times daily. Swish and spit Patient not taking: Reported on 05/23/2021 04/05/19   Cuthriell, Charline Bills, PA-C  cyclobenzaprine (FLEXERIL) 5 MG tablet Take 1-2 tablets 3 times daily as needed Patient not taking: Reported on 05/23/2021 02/07/18   Laban Emperor, PA-C  gabapentin (NEURONTIN) 100 MG capsule Take 1 capsule (100 mg total) by mouth at bedtime. May take a second capsule at bedside if necessary 01/28/19 09/25/19  Molli Barrows, MD  HYDROcodone-acetaminophen (NORCO/VICODIN) 5-325 MG tablet Take 1 tablet by mouth every 6 (six) hours as needed for severe pain. Patient not taking: Reported on 05/23/2021 10/12/19   Lavonia Drafts, MD  lactulose (CEPHULAC) 20 g packet Take 1 packet (20 g total) by mouth 3 (three) times daily. 11/28/17   [name], MD  lidocaine (LIDODERM) 5 % Place 1 patch onto the skin daily. Remove & Discard patch within 12 hours or as directed by MD 01/28/19   Molli Barrows, MD  linaclotide Rolan Lipa) 72 MCG capsule Take 72 mcg by mouth daily before breakfast.  08/14/16   [provider]  megestrol (MEGACE) 400 MG/10ML suspension Take 5 mLs (200 mg total) by mouth daily. Patient not taking: Reported on 05/23/2021 03/31/19   Lequita Asal, MD  predniSONE (DELTASONE) 10 MG tablet Take 1 tablet (10 mg total) by mouth daily with breakfast. 05/23/21   Flora Lipps, MD  predniSONE (STERAPRED UNI-PAK 21 TAB) 10 MG (21) TBPK tablet Use as directed. Patient not taking: Reported on  05/23/2021 12/14/20   Tyler Pita, MD  sucralfate (CARAFATE) 1 g tablet Take 1 tablet (1 g total) by mouth 4 (four) times daily. 05/29/18 04/27/20  [name], MD  umeclidinium-vilanterol [facility] ELLIPTA) 62.5-25 MCG/ACT AEPB Inhale 1 puff into the lungs daily. 05/23/21   Flora Lipps, MD  umeclidinium-vilanterol (ANORO ELLIPTA) 62.5-25 MCG/INH AEPB Inhale 1 puff into the lungs daily. 04/27/20   Flora Lipps, MD    Allergies Patient has no known allergies.  Family History  Problem Relation Age of Onset   Pancreatic cancer Sister    Lung cancer Sister    Lung cancer Brother    Stomach cancer Paternal Grandmother    Heart disease Father    Cancer Daughter 37       cervical   Cancer Maternal Aunt        brain   Cancer Maternal Uncle        prostate   Cancer Maternal Aunt        bone    Social History Social History  Tobacco Use   Smoking status: Former    Packs/day: 0.25    Years: 35.00    Pack years: 8.75    Types: Cigarettes    Quit date: 03/19/2016    Years since quitting: 5.2   Smokeless tobacco: Never  Vaping Use   Vaping Use: Never used  Substance Use Topics   Alcohol use: Yes   Drug use: No    Review of Systems  Constitutional: No fever/chills Eyes: No visual changes. ENT: No sore throat. Cardiovascular: Denies chest pain. Respiratory: Denies shortness of breath. Gastrointestinal: No abdominal pain.  No nausea, no vomiting.  No diarrhea.  No constipation. Genitourinary: Negative for dysuria. Musculoskeletal: Positive for left-sided thoracic back pain, atraumatic, radiating around her flank to her chest, as well as down her left shoulder and arm. Skin: Negative for rash. Neurological: Negative for headaches, focal weakness or numbness. ____________________________________________  PHYSICAL EXAM:  VITAL SIGNS: Vitals:   06/12/21 1219  BP: (!) 144/73  Pulse: 65  Resp: 18  Temp: 98.1 F (36.7 C)  SpO2: 100%    Constitutional: Alert and  oriented. Well appearing and in no acute distress.  Ambulatory with normal gait.  Pleasant and conversational in full sentences. Eyes: Conjunctivae are normal. PERRL. EOMI. Head: Atraumatic. Nose: No congestion/rhinnorhea. Mouth/Throat: Mucous membranes are moist.  Oropharynx non-erythematous. Neck: No stridor. No cervical spine tenderness to palpation. Cardiovascular: Normal rate, regular rhythm. Grossly normal heart sounds.  Good peripheral circulation. Respiratory: Normal respiratory effort.  No retractions. Lungs CTAB. Gastrointestinal: Soft , nondistended, nontender to palpation. No CVA tenderness. Musculoskeletal: No lower extremity tenderness nor edema.  No joint effusions. No signs of acute trauma. Tenderness to palpation to her left trapezius musculature and left-sided paraspinal thoracic musculature, recreating her radiating pain.  No overlying signs of trauma, skin changes, erythema or swelling. No midline spinal tenderness throughout the back or spinal step-offs. Neurologic:  Normal speech and language. No gross focal neurologic deficits are appreciated. No gait instability noted. Cranial nerves II through XII intact 5/5 strength and sensation in all 4 extremities Skin:  Skin is warm, dry and intact. No rash noted. Psychiatric: Mood and affect are normal. Speech and behavior are normal. ____________________________________________   LABS (all labs ordered are listed, but only abnormal results are displayed)  Labs Reviewed  BASIC METABOLIC PANEL - Abnormal; Notable for the following components:      Result Value   Glucose, Bld 130 (*)    All other components within normal limits  CBC - Abnormal; Notable for the following components:   Platelets 412 (*)    All other components within normal limits  TROPONIN I (HIGH SENSITIVITY)  TROPONIN I (HIGH SENSITIVITY)   ____________________________________________  12 Lead EKG  Sinus rhythm, rate of 66 bpm.  Normal axis and  normal intervals.  No STEMI.  Nonspecific ST changes laterally without recent comparison.  Wandering baseline. ____________________________________________  RADIOLOGY  ED MD interpretation:  CXR reviewed by me without evidence of acute cardiopulmonary pathology.  Official radiology report(s): DG Chest 2 View  Result Date: 06/12/2021 CLINICAL DATA:  Chest pain EXAM: CHEST - 2 VIEW COMPARISON:  Chest radiograph dated October 12, 2018 FINDINGS: The heart size and mediastinal contours are within normal limits. Emphysematous changes of bilateral lungs. Postsurgical changes of the left lung with volume loss and elevation of left hemidiaphragm, unchanged. The visualized skeletal structures are unremarkable. IMPRESSION: No active cardiopulmonary disease. Emphysema and postsurgical changes of the left lung with volume loss, unchanged. Electronically Signed  By: Keane Police D.O.   On: 06/12/2021 13:02    ____________________________________________   PROCEDURES and INTERVENTIONS  Procedure(s) performed (including Critical Care):  Procedures  Medications - No data to display  ____________________________________________   MDM / ED COURSE   Pleasant 65 year old woman presents to the ED with acute on chronic left shoulder, arm and chest pain, likely MSK in etiology, and amenable to outpatient management.  Normal vitals on room air.  Reassuring examination without evidence of neurologic or vascular deficits.  No signs of trauma, skin changes.  She does have a palpable muscular cord and tenderness to palpation to her left trapezius muscle, recreating her symptoms.  I anticipate muscular spasm as etiology of her symptoms.  We discussed Tylenol and lidocaine patches.  I did urge the patient to probably stop using crack cocaine topically.  We discussed return precautions for the ED.   ____________________________________________   FINAL CLINICAL IMPRESSION(S) / ED DIAGNOSES  Final diagnoses:  Left  arm pain  Muscle spasm     ED Discharge Orders          Ordered    lidocaine (LIDODERM) 5 %  Every 12 hours        06/12/21 1408             Sharanya Templin   Note:  This document was prepared using Systems analyst and may include unintentional dictation errors.    Vladimir Crofts, MD 06/12/21 [phone]

## 2021-06-12 NOTE — ED Triage Notes (Signed)
Pt c/o left arm pain from shoulder into the hand and having pain the left lateral chest, states she has a Lung CA with lobectomy of the left lung in the past.,

## 2021-10-18 ENCOUNTER — Telehealth: Payer: Self-pay | Admitting: Internal Medicine

## 2021-10-18 MED ORDER — AZITHROMYCIN 250 MG PO TABS: ORAL_TABLET | ORAL | 0 refills | Status: AC

## 2021-10-18 MED ORDER — PREDNISONE 20 MG PO TABS: 20.0000 mg | ORAL_TABLET | Freq: Every day | ORAL | 0 refills | Status: DC

## 2021-10-18 NOTE — Telephone Encounter (Signed)
Spoke to patient. ? C/o increased sob with exertion, nasal drainage, chest congestion and non prod cough x2w.  ?Denied f/c/s or additional sx.  ?Using Anoro once daily and albuterol HFA once daily without relief.  ? ?Dr. Mortimer Fries, please advise. ?

## 2021-10-18 NOTE — Telephone Encounter (Signed)
Pred 20 g daily for 7 days  ?Z pak or augmentin 875mg  BID for 10 days   ?Patient is aware of recommendations and voiced her understanding.  ?Prednisone an d zpak sent to preferred pharmacy. ?Nothing further needed.  ? ?

## 2021-11-09 ENCOUNTER — Encounter: Payer: Self-pay | Admitting: Ophthalmology

## 2021-11-11 NOTE — Discharge Instructions (Signed)

## 2021-11-15 ENCOUNTER — Encounter
Admission: RE | Disposition: A | Discharge status: Home / Self Care | Payer: Self-pay | Source: Home / Self Care | Attending: Ophthalmology

## 2021-11-15 ENCOUNTER — Ambulatory Visit: Payer: 59 | Admitting: Anesthesiology

## 2021-11-15 ENCOUNTER — Ambulatory Visit
Admission: RE | Admit: 2021-11-15 | Discharge: 2021-11-15 | Disposition: A | Discharge status: Home / Self Care | Payer: 59 | Attending: Ophthalmology | Admitting: Ophthalmology

## 2021-11-15 ENCOUNTER — Encounter: Payer: Self-pay | Admitting: Ophthalmology

## 2021-11-15 ENCOUNTER — Other Ambulatory Visit: Payer: Self-pay

## 2021-11-15 DIAGNOSIS — K589 Irritable bowel syndrome without diarrhea: Secondary | ICD-10-CM | POA: Diagnosis not present

## 2021-11-15 DIAGNOSIS — H2511 Age-related nuclear cataract, right eye: Secondary | ICD-10-CM | POA: Diagnosis present

## 2021-11-15 DIAGNOSIS — Z87891 Personal history of nicotine dependence: Secondary | ICD-10-CM | POA: Diagnosis not present

## 2021-11-15 DIAGNOSIS — Z902 Acquired absence of lung [part of]: Secondary | ICD-10-CM | POA: Diagnosis not present

## 2021-11-15 DIAGNOSIS — J439 Emphysema, unspecified: Secondary | ICD-10-CM | POA: Diagnosis not present

## 2021-11-15 DIAGNOSIS — I2510 Atherosclerotic heart disease of native coronary artery without angina pectoris: Secondary | ICD-10-CM | POA: Diagnosis not present

## 2021-11-15 DIAGNOSIS — Z7951 Long term (current) use of inhaled steroids: Secondary | ICD-10-CM | POA: Diagnosis not present

## 2021-11-15 DIAGNOSIS — M1990 Unspecified osteoarthritis, unspecified site: Secondary | ICD-10-CM | POA: Insufficient documentation

## 2021-11-15 DIAGNOSIS — M329 Systemic lupus erythematosus, unspecified: Secondary | ICD-10-CM | POA: Insufficient documentation

## 2021-11-15 DIAGNOSIS — Z85118 Personal history of other malignant neoplasm of bronchus and lung: Secondary | ICD-10-CM | POA: Insufficient documentation

## 2021-11-15 DIAGNOSIS — K219 Gastro-esophageal reflux disease without esophagitis: Secondary | ICD-10-CM | POA: Insufficient documentation

## 2021-11-15 HISTORY — PX: CATARACT EXTRACTION W/PHACO: SHX586

## 2021-11-15 HISTORY — DX: Presence of dental prosthetic device (complete) (partial): Z97.2

## 2021-11-15 SURGERY — PHACOEMULSIFICATION, CATARACT, WITH IOL INSERTION
Anesthesia: Monitor Anesthesia Care | Site: Eye | Laterality: Right

## 2021-11-15 MED ORDER — OXYCODONE HCL 5 MG PO TABS: 5.0000 mg | ORAL_TABLET | Freq: Once | ORAL | Status: DC | PRN

## 2021-11-15 MED ORDER — SIGHTPATH DOSE#1 BSS IO SOLN
INTRAOCULAR | Status: DC | PRN
  Administered 2021-11-15: 83 mL via OPHTHALMIC

## 2021-11-15 MED ORDER — BRIMONIDINE TARTRATE-TIMOLOL 0.2-0.5 % OP SOLN
OPHTHALMIC | Status: DC | PRN
  Administered 2021-11-15: 1 [drp] via OPHTHALMIC

## 2021-11-15 MED ORDER — SIGHTPATH DOSE#1 NA HYALUR & NA CHOND-NA HYALUR IO KIT
PACK | INTRAOCULAR | Status: DC | PRN
  Administered 2021-11-15: 1 via OPHTHALMIC

## 2021-11-15 MED ORDER — MOXIFLOXACIN HCL 0.5 % OP SOLN
OPHTHALMIC | Status: DC | PRN
  Administered 2021-11-15: 0.2 mL via OPHTHALMIC

## 2021-11-15 MED ORDER — OXYCODONE HCL 5 MG/5ML PO SOLN: 5.0000 mg | Freq: Once | ORAL | Status: DC | PRN

## 2021-11-15 MED ORDER — SIGHTPATH DOSE#1 BSS IO SOLN
INTRAOCULAR | Status: DC | PRN
  Administered 2021-11-15: 15 mL

## 2021-11-15 MED ORDER — MIDAZOLAM HCL 2 MG/2ML IJ SOLN
INTRAMUSCULAR | Status: DC | PRN
  Administered 2021-11-15: 2 mg via INTRAVENOUS

## 2021-11-15 MED ORDER — FENTANYL CITRATE (PF) 100 MCG/2ML IJ SOLN
INTRAMUSCULAR | Status: DC | PRN
  Administered 2021-11-15 (×2): 50 ug via INTRAVENOUS

## 2021-11-15 MED ORDER — TETRACAINE HCL 0.5 % OP SOLN
1.0000 [drp] | OPHTHALMIC | Status: DC | PRN
  Administered 2021-11-15 (×3): 1 [drp] via OPHTHALMIC

## 2021-11-15 MED ORDER — ARMC OPHTHALMIC DILATING DROPS
1.0000 "application " | OPHTHALMIC | Status: DC | PRN
  Administered 2021-11-15 (×3): 1 via OPHTHALMIC

## 2021-11-15 MED ORDER — LIDOCAINE HCL (PF) 2 % IJ SOLN
INTRAOCULAR | Status: DC | PRN
  Administered 2021-11-15: 1 mL via INTRAOCULAR

## 2021-11-15 MED ORDER — LACTATED RINGERS IV SOLN: INTRAVENOUS | Status: DC

## 2021-11-15 SURGICAL SUPPLY — 15 items
CATARACT SUITE SIGHTPATH (MISCELLANEOUS) ×2 IMPLANT
DISSECTOR HYDRO NUCLEUS 50X22 (MISCELLANEOUS) ×2 IMPLANT
DRSG TEGADERM 2-3/8X2-3/4 SM (GAUZE/BANDAGES/DRESSINGS) ×2 IMPLANT
FEE CATARACT SUITE SIGHTPATH (MISCELLANEOUS) ×1 IMPLANT
GLOVE SURG GAMMEX PI TX LF 7.5 (GLOVE) ×2 IMPLANT
GLOVE SURG SYN 8.5  E (GLOVE) ×1
GLOVE SURG SYN 8.5 E (GLOVE) ×1 IMPLANT
GLOVE SURG SYN 8.5 PF PI (GLOVE) ×1 IMPLANT
LENS IOL TECNIS EYHANCE 24.0 (Intraocular Lens) ×1 IMPLANT
NDL FILTER BLUNT 18X1 1/2 (NEEDLE) ×1 IMPLANT
NEEDLE FILTER BLUNT 18X 1/2SAF (NEEDLE) ×1
NEEDLE FILTER BLUNT 18X1 1/2 (NEEDLE) ×1 IMPLANT
SYR 3ML LL SCALE MARK (SYRINGE) ×2 IMPLANT
SYR 5ML LL (SYRINGE) ×2 IMPLANT
WATER STERILE IRR 250ML POUR (IV SOLUTION) ×2 IMPLANT

## 2021-11-15 NOTE — H&P (Signed)
[place]  ? ?Primary Care Physician:  Letta Median, MD ?Ophthalmologist: Dr. Merleen Nicely ? ?Pre-Procedure History & Physical: ?HPI:  Sheila Suarez is a 66 y.o. female here for cataract surgery. ?  ?Past Medical History:  ?Diagnosis Date  ? Cancer [facility])   ? lung  ? Coronary artery disease   ? Emphysema of lung ([place])   ? GERD (gastroesophageal reflux disease)   ? Lupus ([place])   ? 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 Shortness of breath dyspnea   ? Sinus infection   ? 9/19  ? Wears dentures   ? full upper and lower  ? ? ?Past Surgical History:  ?Procedure Laterality Date  ? COLONOSCOPY WITH PROPOFOL N/A 06/02/2016  ? Procedure: COLONOSCOPY WITH PROPOFOL;  Surgeon: [name], MD;  Location: [facility] ENDOSCOPY;  Service: Gastroenterology;  Laterality: N/A;  ? CYST EXCISION Left   ? hand  ? ESOPHAGOGASTRODUODENOSCOPY (EGD) WITH PROPOFOL N/A 06/02/2016  ? Procedure: ESOPHAGOGASTRODUODENOSCOPY (EGD) WITH PROPOFOL;  Surgeon: [name], MD;  Location: [facility] ENDOSCOPY;  Service: Gastroenterology;  Laterality: N/A;  ? ESOPHAGOGASTRODUODENOSCOPY (EGD) WITH PROPOFOL N/A 05/31/2018  ? Procedure: ESOPHAGOGASTRODUODENOSCOPY (EGD) WITH PROPOFOL;  Surgeon: [name], MD;  Location: [facility] ENDOSCOPY;  Service: Gastroenterology;  Laterality: N/A;  ? FLEXIBLE BRONCHOSCOPY N/A 04/17/2016  ? Procedure: FLEXIBLE BRONCHOSCOPY;  Surgeon: [name], MD;  Location: [facility] ORS;  Service: Thoracic;  Laterality: N/A;  ? HAND SURGERY Left   ? left upper lobectomy Left 03/2016  ? LUNG REMOVAL, PARTIAL Left   ? Left lower lobe  ? THORACOTOMY Left 04/17/2016  ? Procedure: THORACOTOMY LEFT UPPER LOBE LUNG RESECTION;  Surgeon: [name], MD;  Location: [facility] ORS;  Service: Thoracic;  Laterality: Left;  ? TUBAL LIGATION Bilateral   ? 37 years ago  ? ? ?Prior to Admission medications   ?Medication Sig Start Date End Date Taking? Authorizing Provider  ?albuterol (VENTOLIN HFA) 108 (90 Base) MCG/ACT inhaler  Inhale 1-2 puffs into the lungs every 4 (four) hours as needed for wheezing or shortness of breath. 05/23/21  Yes Flora Lipps, MD  ?lidocaine (LIDODERM) 5 % Place 1 patch onto the skin every 12 (twelve) hours. Remove & Discard patch within 12 hours or as directed by MD 06/12/21 06/12/22 Yes Vladimir Crofts, MD  ?umeclidinium-vilanterol [facility] ELLIPTA) 62.5-25 MCG/ACT AEPB Inhale 1 puff into the lungs daily. 05/23/21  Yes Flora Lipps, MD  ?chlorhexidine (PERIDEX) 0.12 % solution Use as directed 10 mLs in the mouth or throat 2 (two) times daily. Swish and spit ?Patient not taking: Reported on 05/23/2021 04/05/19   Cuthriell, Charline Bills, PA-C  ?cyclobenzaprine (FLEXERIL) 5 MG tablet Take 1-2 tablets 3 times daily as needed ?Patient not taking: Reported on 11/09/2021 02/07/18   Laban Emperor, PA-C  ?gabapentin (NEURONTIN) 100 MG capsule Take 1 capsule (100 mg total) by mouth at bedtime. May take a second capsule at bedside if necessary 01/28/19 09/25/19  Molli Barrows, MD  ?HYDROcodone-acetaminophen (NORCO/VICODIN) 5-325 MG tablet Take 1 tablet by mouth every 6 (six) hours as needed for severe pain. ?Patient not taking: Reported on 05/23/2021 10/12/19   Lavonia Drafts, MD  ?lactulose (CEPHULAC) 20 g packet Take 1 packet (20 g total) by mouth 3 (three) times daily. ?Patient not taking: Reported on 11/09/2021 11/28/17   [name], MD  ?linaclotide Rolan Lipa) 72 MCG capsule Take 72 mcg by mouth daily before breakfast.  ?Patient not taking: Reported on 11/09/2021 08/14/16   [provider]  ?megestrol (  MEGACE) 400 MG/10ML suspension Take 5 mLs (200 mg total) by mouth daily. ?Patient not taking: Reported on 05/23/2021 03/31/19   Lequita Asal, MD  ?predniSONE (DELTASONE) 20 MG tablet Take 1 tablet (20 mg total) by mouth daily with breakfast. ?Patient not taking: Reported on 11/09/2021 10/18/21   Flora Lipps, MD  ?sucralfate (CARAFATE) 1 g tablet Take 1 tablet (1 g total) by mouth 4 (four) times daily. 05/29/18 04/27/20   [name], MD  ?umeclidinium-vilanterol [facility] ELLIPTA) 62.5-25 MCG/INH AEPB Inhale 1 puff into the lungs daily. 04/27/20   Flora Lipps, MD  ? ? ?Allergies as of 10/05/2021  ? (No Known Allergies)  ? ? ?Family History  ?Problem Relation Age of Onset  ? Pancreatic cancer Sister   ? Lung cancer Sister   ? Lung cancer Brother   ? Stomach cancer Paternal Grandmother   ? Heart disease Father   ? Cancer Daughter 17  ?     cervical  ? Cancer Maternal Aunt   ?     brain  ? Cancer Maternal Uncle   ?     prostate  ? Cancer Maternal Aunt   ?     bone  ? ? ?Social History  ? ?Socioeconomic History  ? Marital status: Divorced  ?  Spouse name: Not on file  ? Number of children: Not on file  ? Years of education: Not on file  ? Highest education level: Not on file  ?Occupational History  ? Not on file  ?Tobacco Use  ? Smoking status: Former  ?  Packs/day: 0.25  ?  Years: 35.00  ?  Pack years: 8.75  ?  Types: Cigarettes  ?  Quit date: 03/19/2016  ?  Years since quitting: 5.6  ? Smokeless tobacco: Never  ?Vaping Use  ? Vaping Use: Never used  ?Substance and Sexual Activity  ? Alcohol use: Yes  ?  Comment: rare  ? Drug use: No  ? Sexual activity: Not on file  ?Other Topics Concern  ? Not on file  ?Social History Narrative  ? Not on file  ? ?Social Determinants of Health  ? ?Financial Resource Strain: Not on file  ?Food Insecurity: Not on file  ?Transportation Needs: Not on file  ?Physical Activity: Not on file  ?Stress: Not on file  ?Social Connections: Not on file  ?Intimate Partner Violence: Not on file  ? ? ?Review of Systems: ?See HPI, otherwise negative ROS ? ?Physical Exam: ?BP (!) 165/72   Pulse (!) 56   Temp 98.6 ?F (37 ?C) (Temporal)   Ht 5\' 5"  (1.651 m)   Wt 47.9 kg   SpO2 100%   BMI 17.56 kg/m?  ?General:   Alert, cooperative in NAD ?Head:  Normocephalic and atraumatic. ?Respiratory:  Normal work of breathing. ?Cardiovascular:  RRR ? ?Impression/Plan: ?[name] is here for cataract surgery. ? ?Risks,  benefits, limitations, and alternatives regarding cataract surgery have been reviewed with the patient.  Questions have been answered.  All parties agreeable. ? ? ?Birder Robson, MD  11/15/2021, 12:45 PM ? ? ?

## 2021-11-15 NOTE — Anesthesia Postprocedure Evaluation (Signed)
Anesthesia Post Note ? ?Patient: Sheila Suarez ? ?Procedure(s) Performed: CATARACT EXTRACTION PHACO AND INTRAOCULAR LENS PLACEMENT (IOC) RIGHT 9.21 00:55.8 (Right: Eye) ? ? ?  ?Patient location during evaluation: PACU ?Anesthesia Type: MAC ?Level of consciousness: awake and alert ?Pain management: pain level controlled ?Vital Signs Assessment: post-procedure vital signs reviewed and stable ?Respiratory status: spontaneous breathing, nonlabored ventilation, respiratory function stable and patient connected to nasal cannula oxygen ?Cardiovascular status: stable and blood pressure returned to baseline ?Postop Assessment: no apparent nausea or vomiting ?Anesthetic complications: no ? ? ?No notable events documented. ? ?Fidel Levy ? ? ? ? ? ?

## 2021-11-15 NOTE — Op Note (Signed)
PREOPERATIVE DIAGNOSIS:  Nuclear sclerotic cataract of the right eye. ?  ?POSTOPERATIVE DIAGNOSIS:  H25.11 Cataract ?  ?OPERATIVE PROCEDURE:ORPROCALL@ ?  ?SURGEON:  Merleen Nicely, MD ?Assistant Surgeon: Birder Robson, MD. ?  ?ANESTHESIA: ? ?Anesthesiologist: Fidel Levy, MD ?CRNA: Silvana Newness, CRNA ? ?1.      Managed anesthesia care. ?2.      0.59ml of Shugarcaine was instilled in the eye following the paracentesis. ?  ?COMPLICATIONS:  None. ?  ?TECHNIQUE:   Tilt and tumble ?  ?DESCRIPTION OF PROCEDURE:  The patient was examined and consented in the preoperative holding area where the aforementioned topical anesthesia was applied to the right eye and then brought back to the Operating Room where the right eye was prepped and draped in the usual sterile ophthalmic fashion and a lid speculum was placed. A paracentesis was created with the side port blade and the anterior chamber was filled with viscoelastic. A near clear corneal incision was performed with the steel keratome. A continuous curvilinear capsulorrhexis was performed with a cystotome followed by the capsulorrhexis forceps. Hydrodissection and hydrodelineation were carried out with BSS on a blunt cannula. The lens was removed in a tilt and tumble technique and the remaining cortical material was removed with the irrigation-aspiration handpiece. The capsular bag was inflated with viscoelastic and the DIB00 +24.00 D lens was placed in the capsular bag without complication. The remaining viscoelastic was removed from the eye with the irrigation-aspiration handpiece. The wounds were hydrated. The anterior chamber was flushed with BSS and the eye was inflated to physiologic pressure. 0.11ml of Vigamox was placed in the anterior chamber. The wounds were found to be water tight. The eye was dressed with Combigan. The patient was given protective glasses to wear throughout the day and a shield with which to sleep tonight. The patient was also given drops  with which to begin a drop regimen today and will follow-up with me in one day. ?Implant Name Type Inv. Item Serial No. Manufacturer Lot No. LRB No. Used Action  ?LENS IOL TECNIS EYHANCE 24.0 - [id number] Intraocular Lens LENS IOL TECNIS EYHANCE 24.0 [id number] [employer]  Right 1 Implanted  ? ?Procedure(s): ?CATARACT EXTRACTION PHACO AND INTRAOCULAR LENS PLACEMENT (IOC) RIGHT 9.21 00:55.8 (Right) ? ?Electronically signed: Birder Robson 11/15/2021 1:25 PM ? ?

## 2021-11-15 NOTE — Transfer of Care (Signed)
Immediate Anesthesia Transfer of Care Note ? ?Patient: Sheila Suarez ? ?Procedure(s) Performed: CATARACT EXTRACTION PHACO AND INTRAOCULAR LENS PLACEMENT (IOC) RIGHT 9.21 00:55.8 (Right: Eye) ? ?Patient Location: PACU ? ?Anesthesia Type: MAC ? ?Level of Consciousness: awake, alert  and patient cooperative ? ?Airway and Oxygen Therapy: Patient Spontanous Breathing and Patient connected to supplemental oxygen ? ?Post-op Assessment: Post-op Vital signs reviewed, Patient's Cardiovascular Status Stable, Respiratory Function Stable, Patent Airway and No signs of Nausea or vomiting ? ?Post-op Vital Signs: Reviewed and stable ? ?Complications: No notable events documented. ? ?

## 2021-11-15 NOTE — Anesthesia Preprocedure Evaluation (Signed)
Anesthesia Evaluation  ?Patient identified by MRN, date of birth, ID band ?Patient awake ? ? ? ?Reviewed: ?NPO status  ? ?History of Anesthesia Complications ?Negative for: history of anesthetic complications ? ?Airway ?Mallampati: II ? ?TM Distance: >3 FB ?Neck ROM: full ? ? ? Dental ? ?(+) Upper Dentures, Lower Dentures ?  ?Pulmonary ?COPD (mild),  COPD inhaler, former smoker,  ?  ?Pulmonary exam normal ? ? ? ? ? ? ? Cardiovascular ?Exercise Tolerance: Good ?+ CAD  ?Normal cardiovascular exam ? ?cards: Jettie Booze, [place] at 04/28/2021; ? ?ekg: nov 2022: nsr; ? ?echo: 03/2021: ?NORMAL LEFT VENTRICULAR SYSTOLIC FUNCTION ?NORMAL RIGHT VENTRICULAR SYSTOLIC FUNCTION ?MILD VALVULAR REGURGITATION  ?NO VALVULAR STENOSIS; ? ?  ?Neuro/Psych ?negative neurological ROS ? negative psych ROS  ? GI/Hepatic ?Neg liver ROS, GERD  Controlled,ibs ? ?  ?Endo/Other  ?negative endocrine ROS ? Renal/GU ?negative Renal ROS  ?negative genitourinary ?  ?Musculoskeletal ? ?(+) Arthritis , lupus  ? Abdominal ?  ?Peds ? Hematology ?L lung cancer : 2017 : LLLobectomy   ?Anesthesia Other Findings ? ? Reproductive/Obstetrics ? ?  ? ? ? ? ? ? ? ? ? ? ? ? ? ?  ?  ? ? ? ? ? ? ? ?Anesthesia Physical ?Anesthesia Plan ? ?ASA: 3 ? ?Anesthesia Plan: MAC  ? ?Post-op Pain Management:   ? ?Induction:  ? ?PONV Risk Score and Plan: 2 and TIVA and Midazolam ? ?Airway Management Planned:  ? ?Additional Equipment:  ? ?Intra-op Plan:  ? ?Post-operative Plan:  ? ?Informed Consent: I have reviewed the patients History and Physical, chart, labs and discussed the procedure including the risks, benefits and alternatives for the proposed anesthesia with the patient or authorized representative who has indicated his/her understanding and acceptance.  ? ? ? ? ? ?Plan Discussed with: CRNA ? ?Anesthesia Plan Comments:   ? ? ? ? ? ?Anesthesia Quick Evaluation ? ?

## 2021-11-16 ENCOUNTER — Encounter: Payer: Self-pay | Admitting: Ophthalmology

## 2021-11-22 ENCOUNTER — Encounter: Payer: Self-pay | Admitting: Ophthalmology

## 2021-11-29 ENCOUNTER — Ambulatory Visit
Admission: RE | Admit: 2021-11-29 | Discharge: 2021-11-29 | Disposition: A | Discharge status: Home / Self Care | Payer: 59 | Attending: Ophthalmology | Admitting: Ophthalmology

## 2021-11-29 ENCOUNTER — Ambulatory Visit: Payer: 59 | Admitting: Anesthesiology

## 2021-11-29 ENCOUNTER — Other Ambulatory Visit: Payer: Self-pay

## 2021-11-29 ENCOUNTER — Encounter
Admission: RE | Disposition: A | Discharge status: Home / Self Care | Payer: Self-pay | Source: Home / Self Care | Attending: Ophthalmology

## 2021-11-29 ENCOUNTER — Encounter: Payer: Self-pay | Admitting: Ophthalmology

## 2021-11-29 DIAGNOSIS — Z79899 Other long term (current) drug therapy: Secondary | ICD-10-CM | POA: Diagnosis not present

## 2021-11-29 DIAGNOSIS — Z87891 Personal history of nicotine dependence: Secondary | ICD-10-CM | POA: Diagnosis not present

## 2021-11-29 DIAGNOSIS — J439 Emphysema, unspecified: Secondary | ICD-10-CM | POA: Insufficient documentation

## 2021-11-29 DIAGNOSIS — H2512 Age-related nuclear cataract, left eye: Secondary | ICD-10-CM | POA: Insufficient documentation

## 2021-11-29 DIAGNOSIS — I2510 Atherosclerotic heart disease of native coronary artery without angina pectoris: Secondary | ICD-10-CM | POA: Diagnosis not present

## 2021-11-29 DIAGNOSIS — K219 Gastro-esophageal reflux disease without esophagitis: Secondary | ICD-10-CM | POA: Diagnosis not present

## 2021-11-29 DIAGNOSIS — M329 Systemic lupus erythematosus, unspecified: Secondary | ICD-10-CM | POA: Insufficient documentation

## 2021-11-29 HISTORY — PX: CATARACT EXTRACTION W/PHACO: SHX586

## 2021-11-29 SURGERY — PHACOEMULSIFICATION, CATARACT, WITH IOL INSERTION
Anesthesia: Monitor Anesthesia Care | Site: Eye | Laterality: Left

## 2021-11-29 MED ORDER — TETRACAINE 0.5 % OP SOLN OPTIME - NO CHARGE
OPHTHALMIC | Status: DC | PRN
  Administered 2021-11-29: 1 [drp] via OPHTHALMIC

## 2021-11-29 MED ORDER — ARMC OPHTHALMIC DILATING DROPS
1.0000 "application " | OPHTHALMIC | Status: DC | PRN
  Administered 2021-11-29 (×3): 1 via OPHTHALMIC

## 2021-11-29 MED ORDER — MOXIFLOXACIN HCL 0.5 % OP SOLN
OPHTHALMIC | Status: DC | PRN
Start: 2021-11-29 — End: 2021-11-29
  Administered 2021-11-29: 0.2 mL via OPHTHALMIC

## 2021-11-29 MED ORDER — FENTANYL CITRATE (PF) 100 MCG/2ML IJ SOLN
INTRAMUSCULAR | Status: DC | PRN
  Administered 2021-11-29 (×2): 50 ug via INTRAVENOUS

## 2021-11-29 MED ORDER — OXYCODONE HCL 5 MG PO TABS: 5.0000 mg | ORAL_TABLET | Freq: Once | ORAL | Status: DC | PRN

## 2021-11-29 MED ORDER — SIGHTPATH DOSE#1 BSS IO SOLN
INTRAOCULAR | Status: DC | PRN
  Administered 2021-11-29: 82 mL via OPHTHALMIC

## 2021-11-29 MED ORDER — BRIMONIDINE TARTRATE-TIMOLOL 0.2-0.5 % OP SOLN
OPHTHALMIC | Status: DC | PRN
  Administered 2021-11-29: 1 [drp] via OPHTHALMIC

## 2021-11-29 MED ORDER — OXYCODONE HCL 5 MG/5ML PO SOLN: 5.0000 mg | Freq: Once | ORAL | Status: DC | PRN

## 2021-11-29 MED ORDER — SIGHTPATH DOSE#1 NA HYALUR & NA CHOND-NA HYALUR IO KIT
PACK | INTRAOCULAR | Status: DC | PRN
  Administered 2021-11-29: 1 via OPHTHALMIC

## 2021-11-29 MED ORDER — LIDOCAINE HCL (PF) 2 % IJ SOLN
INTRAOCULAR | Status: DC | PRN
  Administered 2021-11-29: 1 mL via INTRAOCULAR

## 2021-11-29 MED ORDER — MEPERIDINE HCL 25 MG/ML IJ SOLN: 6.2500 mg | INTRAMUSCULAR | Status: DC | PRN

## 2021-11-29 MED ORDER — FENTANYL CITRATE PF 50 MCG/ML IJ SOSY: 25.0000 ug | PREFILLED_SYRINGE | INTRAMUSCULAR | Status: DC | PRN

## 2021-11-29 MED ORDER — SIGHTPATH DOSE#1 BSS IO SOLN
INTRAOCULAR | Status: DC | PRN
Start: 2021-11-29 — End: 2021-11-29
  Administered 2021-11-29: 15 mL

## 2021-11-29 MED ORDER — MIDAZOLAM HCL 2 MG/2ML IJ SOLN
INTRAMUSCULAR | Status: DC | PRN
  Administered 2021-11-29: 2 mg via INTRAVENOUS

## 2021-11-29 MED ORDER — TETRACAINE HCL 0.5 % OP SOLN
1.0000 [drp] | OPHTHALMIC | Status: DC | PRN
  Administered 2021-11-29 (×3): 1 [drp] via OPHTHALMIC

## 2021-11-29 MED ORDER — LACTATED RINGERS IV SOLN: INTRAVENOUS | Status: DC

## 2021-11-29 SURGICAL SUPPLY — 15 items
CATARACT SUITE SIGHTPATH (MISCELLANEOUS) ×2 IMPLANT
DISSECTOR HYDRO NUCLEUS 50X22 (MISCELLANEOUS) ×2 IMPLANT
DRSG TEGADERM 2-3/8X2-3/4 SM (GAUZE/BANDAGES/DRESSINGS) ×2 IMPLANT
FEE CATARACT SUITE SIGHTPATH (MISCELLANEOUS) ×1 IMPLANT
GLOVE SURG GAMMEX PI TX LF 7.5 (GLOVE) ×2 IMPLANT
GLOVE SURG SYN 8.5  E (GLOVE) ×1
GLOVE SURG SYN 8.5 E (GLOVE) ×1 IMPLANT
GLOVE SURG SYN 8.5 PF PI (GLOVE) ×1 IMPLANT
LENS IOL TECNIS EYHANCE 24.0 (Intraocular Lens) ×1 IMPLANT
NDL FILTER BLUNT 18X1 1/2 (NEEDLE) ×1 IMPLANT
NEEDLE FILTER BLUNT 18X 1/2SAF (NEEDLE) ×1
NEEDLE FILTER BLUNT 18X1 1/2 (NEEDLE) ×1 IMPLANT
SYR 3ML LL SCALE MARK (SYRINGE) ×2 IMPLANT
SYR 5ML LL (SYRINGE) ×2 IMPLANT
WATER STERILE IRR 250ML POUR (IV SOLUTION) ×2 IMPLANT

## 2021-11-29 NOTE — Discharge Instructions (Signed)

## 2021-11-29 NOTE — Op Note (Signed)
PREOPERATIVE DIAGNOSIS:  Nuclear sclerotic cataract of the left eye. ?  ?POSTOPERATIVE DIAGNOSIS:  Nuclear sclerotic cataract of the left eye. ?  ?OPERATIVE PROCEDURE:ORPROCALL@ ?  ?SURGEON:  Merleen Nicely, MD ?Birder Robson, MD. ?  ?ANESTHESIA: ? ?Anesthesiologist: Marice Potter, MD ?CRNA: Garner Nash, CRNA ? ?1.      Managed anesthesia care. ?2.     0.10ml of Shugarcaine was instilled following the paracentesis ?  ?COMPLICATIONS:  None. ?  ?TECHNIQUE:   Tilt and tumble ?  ?DESCRIPTION OF PROCEDURE:  The patient was examined and consented in the preoperative holding area where the aforementioned topical anesthesia was applied to the left eye and then brought back to the Operating Room where the left eye was prepped and draped in the usual sterile ophthalmic fashion and a lid speculum was placed. A paracentesis was created with the side port blade and the anterior chamber was filled with viscoelastic. A near clear corneal incision was performed with the steel keratome. A continuous curvilinear capsulorrhexis was performed with a cystotome followed by the capsulorrhexis forceps. Hydrodissection and hydrodelineation were carried out with BSS on a blunt cannula. The lens was removed in a tilt and tumble technique and the remaining cortical material was removed with the irrigation-aspiration handpiece. The capsular bag was inflated with viscoelastic and the Technis DIB00 +24.00 lens was placed in the capsular bag without complication. The remaining viscoelastic was removed from the eye with the irrigation-aspiration handpiece. The wounds were hydrated. The anterior chamber was flushed with BSS and the eye was inflated to physiologic pressure. 0.66ml Vigamox was placed in the anterior chamber. The wounds were found to be water tight. The eye was dressed with Combigan. The patient was given protective glasses to wear throughout the day and a shield with which to sleep tonight. The patient was also given  drops with which to begin a drop regimen today and will follow-up with me in one day. ?Implant Name Type Inv. Item Serial No. Manufacturer Lot No. LRB No. Used Action  ?LENS IOL TECNIS EYHANCE 24.0 - [id number] Intraocular Lens LENS IOL TECNIS EYHANCE 24.0 [id number] [employer]  Left 1 Implanted  ?  ?Procedure(s): ?CATARACT EXTRACTION PHACO AND INTRAOCULAR LENS PLACEMENT (IOC) LEFT 8.24 00:54.4 (Left) ? ?I was present in the OR for the entirety of the operation. ? ?Electronically signed: Birder Robson 11/29/2021 1:38 PM ? ?

## 2021-11-29 NOTE — Anesthesia Postprocedure Evaluation (Signed)
Anesthesia Post Note ? ?Patient: Sheila Suarez ? ?Procedure(s) Performed: CATARACT EXTRACTION PHACO AND INTRAOCULAR LENS PLACEMENT (IOC) LEFT 8.24 00:54.4 (Left: Eye) ? ? ?  ?Patient location during evaluation: PACU ?Anesthesia Type: MAC ?Level of consciousness: awake and alert ?Pain management: pain level controlled ?Vital Signs Assessment: post-procedure vital signs reviewed and stable ?Respiratory status: spontaneous breathing, nonlabored ventilation, respiratory function stable and patient connected to nasal cannula oxygen ?Cardiovascular status: blood pressure returned to baseline and stable ?Postop Assessment: no apparent nausea or vomiting ?Anesthetic complications: no ? ? ?No notable events documented. ? ?Cherylyn Sundby ELAINE ? ? ? ? ? ?

## 2021-11-29 NOTE — H&P (Signed)
[place]  ? ?Primary Care Physician:  Letta Median, MD ?Ophthalmologist: Dr. Merleen Nicely ? ?Pre-Procedure History & Physical: ?HPI:  Sheila Suarez is a 66 y.o. female here for cataract surgery. ?  ?Past Medical History:  ?Diagnosis Date  ? Cancer [facility])   ? lung  ? Coronary artery disease   ? Emphysema of lung ([place])   ? GERD (gastroesophageal reflux disease)   ? Lupus ([place])   ? 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 Shortness of breath dyspnea   ? Sinus infection   ? 9/19  ? Wears dentures   ? full upper and lower  ? ? ?Past Surgical History:  ?Procedure Laterality Date  ? CATARACT EXTRACTION W/PHACO Right 11/15/2021  ? Procedure: CATARACT EXTRACTION PHACO AND INTRAOCULAR LENS PLACEMENT (IOC) RIGHT 9.21 00:55.8;  Surgeon: Birder Robson, MD;  Location: [place];  Service: Ophthalmology;  Laterality: Right;  ? COLONOSCOPY WITH PROPOFOL N/A 06/02/2016  ? Procedure: COLONOSCOPY WITH PROPOFOL;  Surgeon: [name], MD;  Location: [facility] ENDOSCOPY;  Service: Gastroenterology;  Laterality: N/A;  ? CYST EXCISION Left   ? hand  ? ESOPHAGOGASTRODUODENOSCOPY (EGD) WITH PROPOFOL N/A 06/02/2016  ? Procedure: ESOPHAGOGASTRODUODENOSCOPY (EGD) WITH PROPOFOL;  Surgeon: [name], MD;  Location: [facility] ENDOSCOPY;  Service: Gastroenterology;  Laterality: N/A;  ? ESOPHAGOGASTRODUODENOSCOPY (EGD) WITH PROPOFOL N/A 05/31/2018  ? Procedure: ESOPHAGOGASTRODUODENOSCOPY (EGD) WITH PROPOFOL;  Surgeon: [name], MD;  Location: [facility] ENDOSCOPY;  Service: Gastroenterology;  Laterality: N/A;  ? FLEXIBLE BRONCHOSCOPY N/A 04/17/2016  ? Procedure: FLEXIBLE BRONCHOSCOPY;  Surgeon: [name], MD;  Location: [facility] ORS;  Service: Thoracic;  Laterality: N/A;  ? HAND SURGERY Left   ? left upper lobectomy Left 03/2016  ? LUNG REMOVAL, PARTIAL Left   ? Left lower lobe  ? THORACOTOMY Left 04/17/2016  ? Procedure: THORACOTOMY LEFT UPPER LOBE LUNG RESECTION;  Surgeon: [name], MD;  Location: [facility]  ORS;  Service: Thoracic;  Laterality: Left;  ? TUBAL LIGATION Bilateral   ? 37 years ago  ? ? ?Prior to Admission medications   ?Medication Sig Start Date End Date Taking? Authorizing Provider  ?albuterol (VENTOLIN HFA) 108 (90 Base) MCG/ACT inhaler Inhale 1-2 puffs into the lungs every 4 (four) hours as needed for wheezing or shortness of breath. 05/23/21  Yes Flora Lipps, MD  ?lidocaine (LIDODERM) 5 % Place 1 patch onto the skin every 12 (twelve) hours. Remove & Discard patch within 12 hours or as directed by MD 06/12/21 06/12/22 Yes Vladimir Crofts, MD  ?umeclidinium-vilanterol [facility] ELLIPTA) 62.5-25 MCG/ACT AEPB Inhale 1 puff into the lungs daily. 05/23/21  Yes Flora Lipps, MD  ?chlorhexidine (PERIDEX) 0.12 % solution Use as directed 10 mLs in the mouth or throat 2 (two) times daily. Swish and spit ?Patient not taking: Reported on 11/22/2021 04/05/19   Cuthriell, Charline Bills, PA-C  ?cyclobenzaprine (FLEXERIL) 5 MG tablet Take 1-2 tablets 3 times daily as needed ?Patient not taking: Reported on 11/09/2021 02/07/18   Laban Emperor, PA-C  ?gabapentin (NEURONTIN) 100 MG capsule Take 1 capsule (100 mg total) by mouth at bedtime. May take a second capsule at bedside if necessary 01/28/19 09/25/19  Molli Barrows, MD  ?HYDROcodone-acetaminophen (NORCO/VICODIN) 5-325 MG tablet Take 1 tablet by mouth every 6 (six) hours as needed for severe pain. ?Patient not taking: Reported on 11/22/2021 10/12/19   Lavonia Drafts, MD  ?lactulose ([place]) 20 g packet Take 1 packet (20 g total) by mouth 3 (three) times daily. ?Patient not taking: Reported  on 11/09/2021 11/28/17   [name], MD  ?linaclotide Rolan Lipa) 72 MCG capsule Take 72 mcg by mouth daily before breakfast.  ?Patient not taking: Reported on 11/09/2021 08/14/16   [provider]  ?megestrol (MEGACE) 400 MG/10ML suspension Take 5 mLs (200 mg total) by mouth daily. ?Patient not taking: Reported on 05/23/2021 03/31/19   Lequita Asal, MD  ?predniSONE (DELTASONE) 20 MG  tablet Take 1 tablet (20 mg total) by mouth daily with breakfast. ?Patient not taking: Reported on 11/09/2021 10/18/21   Flora Lipps, MD  ?sucralfate (CARAFATE) 1 g tablet Take 1 tablet (1 g total) by mouth 4 (four) times daily. 05/29/18 04/27/20  [name], MD  ?umeclidinium-vilanterol [facility] ELLIPTA) 62.5-25 MCG/INH AEPB Inhale 1 puff into the lungs daily. 04/27/20   Flora Lipps, MD  ? ? ?Allergies as of 10/05/2021  ? (No Known Allergies)  ? ? ?Family History  ?Problem Relation Age of Onset  ? Pancreatic cancer Sister   ? Lung cancer Sister   ? Lung cancer Brother   ? Stomach cancer Paternal Grandmother   ? Heart disease Father   ? Cancer Daughter 37  ?     cervical  ? Cancer Maternal Aunt   ?     brain  ? Cancer Maternal Uncle   ?     prostate  ? Cancer Maternal Aunt   ?     bone  ? ? ?Social History  ? ?Socioeconomic History  ? Marital status: Divorced  ?  Spouse name: Not on file  ? Number of children: Not on file  ? Years of education: Not on file  ? Highest education level: Not on file  ?Occupational History  ? Not on file  ?Tobacco Use  ? Smoking status: Former  ?  Packs/day: 0.25  ?  Years: 35.00  ?  Pack years: 8.75  ?  Types: Cigarettes  ?  Quit date: 03/19/2016  ?  Years since quitting: 5.7  ? Smokeless tobacco: Never  ?Vaping Use  ? Vaping Use: Never used  ?Substance and Sexual Activity  ? Alcohol use: Yes  ?  Comment: rare  ? Drug use: No  ? Sexual activity: Not on file  ?Other Topics Concern  ? Not on file  ?Social History Narrative  ? Not on file  ? ?Social Determinants of Health  ? ?Financial Resource Strain: Not on file  ?Food Insecurity: Not on file  ?Transportation Needs: Not on file  ?Physical Activity: Not on file  ?Stress: Not on file  ?Social Connections: Not on file  ?Intimate Partner Violence: Not on file  ? ? ?Review of Systems: ?See HPI, otherwise negative ROS ? ?Physical Exam: ?Ht 5\' 5"  (1.651 m)   Wt 47.6 kg   BMI 17.47 kg/m?  ?General:   Alert, cooperative in NAD ?Head:  Normocephalic  and atraumatic. ?Respiratory:  Normal work of breathing. ?Cardiovascular:  RRR ? ?Impression/Plan: ?[name] is here for cataract surgery. ? ?Risks, benefits, limitations, and alternatives regarding cataract surgery have been reviewed with the patient.  Questions have been answered.  All parties agreeable. ? ? ?Birder Robson, MD  11/29/2021, 11:42 AM ? ? ?

## 2021-11-29 NOTE — Anesthesia Preprocedure Evaluation (Signed)
Anesthesia Evaluation  ?Patient identified by MRN, date of birth, ID band ?Patient awake ? ? ? ?Reviewed: ?Allergy & Precautions, H&P , NPO status , Patient's Chart, lab work & pertinent test results, reviewed documented beta blocker date and time  ? ?Airway ?Mallampati: II ? ?TM Distance: >3 FB ?Neck ROM: full ? ? ? Dental ?no notable dental hx. ? ?  ?Pulmonary ?shortness of breath, COPD, former smoker,  ?  ?Pulmonary exam normal ?breath sounds clear to auscultation ? ? ? ? ? ? Cardiovascular ?Exercise Tolerance: Good ?+ CAD  ? ?Rhythm:regular Rate:Normal ? ? ?  ?Neuro/Psych ? Neuromuscular disease negative psych ROS  ? GI/Hepatic ?Neg liver ROS, GERD  ,  ?Endo/Other  ?negative endocrine ROS ? Renal/GU ?negative Renal ROS  ?negative genitourinary ?  ?Musculoskeletal ? ? Abdominal ?  ?Peds ? Hematology ?negative hematology ROS ?(+)   ?Anesthesia Other Findings ? ? Reproductive/Obstetrics ?negative OB ROS ? ?  ? ? ? ? ? ? ? ? ? ? ? ? ? ?  ?  ? ? ? ? ? ? ? ? ?Anesthesia Physical ?Anesthesia Plan ? ?ASA: 2 ? ?Anesthesia Plan: MAC  ? ?Post-op Pain Management:   ? ?Induction:  ? ?PONV Risk Score and Plan:  ? ?Airway Management Planned:  ? ?Additional Equipment:  ? ?Intra-op Plan:  ? ?Post-operative Plan:  ? ?Informed Consent: I have reviewed the patients History and Physical, chart, labs and discussed the procedure including the risks, benefits and alternatives for the proposed anesthesia with the patient or authorized representative who has indicated his/her understanding and acceptance.  ? ? ? ?Dental Advisory Given ? ?Plan Discussed with: CRNA ? ?Anesthesia Plan Comments:   ? ? ? ? ? ? ?Anesthesia Quick Evaluation ? ?

## 2021-11-29 NOTE — Transfer of Care (Signed)
Immediate Anesthesia Transfer of Care Note ? ?Patient: Sheila Suarez ? ?Procedure(s) Performed: CATARACT EXTRACTION PHACO AND INTRAOCULAR LENS PLACEMENT (IOC) LEFT 8.24 00:54.4 (Left: Eye) ? ?Patient Location: PACU ? ?Anesthesia Type: MAC ? ?Level of Consciousness: awake, alert  and patient cooperative ? ?Airway and Oxygen Therapy: Patient Spontanous Breathing and Patient connected to supplemental oxygen ? ?Post-op Assessment: Post-op Vital signs reviewed, Patient's Cardiovascular Status Stable, Respiratory Function Stable, Patent Airway and No signs of Nausea or vomiting ? ?Post-op Vital Signs: Reviewed and stable ? ?Complications: No notable events documented. ? ?

## 2021-11-30 ENCOUNTER — Encounter: Payer: Self-pay | Admitting: Ophthalmology

## 2022-04-06 ENCOUNTER — Telehealth: Payer: Self-pay | Admitting: Internal Medicine

## 2022-04-06 NOTE — Telephone Encounter (Signed)
She needs to be tested for COVID.  Recommendation is that she go to urgent care.  This way she can be assessed for what she really needs.

## 2022-04-06 NOTE — Telephone Encounter (Signed)
Patient is aware of recommendations and voiced her understanding. Nothing further needed.   

## 2022-04-06 NOTE — Telephone Encounter (Signed)
Spoke to patient. She stated that she recently cleaned with chemicals. She did not wear a mask while cleaning.  She feels that chemicals have gotten into her chest.  C/o prod cough with green sputum, eye pressure, increased SOB and sore throat x2d Denied wheezing, f/c/s or additional sx.  She has not taking a covid test. Recommended that she take covid test and she refused.   Dr. Patsey Berthold, please advise. Dr. Mortimer Fries is unavailable.

## 2022-04-07 ENCOUNTER — Telehealth: Payer: Self-pay | Admitting: Internal Medicine

## 2022-04-07 MED ORDER — LEVALBUTEROL TARTRATE 45 MCG/ACT IN AERO: 1.0000 | INHALATION_SPRAY | RESPIRATORY_TRACT | 2 refills | Status: AC | PRN

## 2022-04-07 NOTE — Telephone Encounter (Signed)
Xopenex has been sent to preferred pharmacy.  Patient is aware and voiced her understanding.  Nothing further needed.

## 2022-04-07 NOTE — Telephone Encounter (Signed)
Spoke to patient.  She stated that albuterol has been recalled. She is requesting alternative.   Dr. Mortimer Fries, please advise if okay to order xopenex.

## 2022-04-11 ENCOUNTER — Telehealth: Payer: Self-pay | Admitting: Internal Medicine

## 2022-04-11 MED ORDER — PREDNISONE 20 MG PO TABS: 20.0000 mg | ORAL_TABLET | Freq: Every day | ORAL | 0 refills | Status: AC

## 2022-04-11 MED ORDER — AZITHROMYCIN 250 MG PO TABS: ORAL_TABLET | ORAL | 0 refills | Status: AC

## 2022-04-11 NOTE — Telephone Encounter (Signed)
Please refer to 04/06/2022 phone note.   Patient stated that she seen at [facility] and was given benzonatate. She stated that she is having side effects to this medication and she has been "seeing things". She was not given abx or prednisone.  Her sx have worsened since inhaling chemicals while cleaning.  C/o prod cough with green sputum, nasal drainage green in color, increased SOB, wheezing and chest tightness.  Using albuterol BID and anoro once daily. Dr. Mortimer Fries, please advise.

## 2022-04-11 NOTE — Telephone Encounter (Signed)
Patient is aware of recommendations and voiced her understanding.  Prednisone and zpak has been sent to preferred pharmacy. Nothing further needed.

## 2022-04-11 NOTE — Telephone Encounter (Signed)
Pt states she is negative for covid. States she was exposed to chemicals while cleaning and she is seeing things. Would like something to help her. She is also coughing (dry cough) while on the phone. States she is unable to sleep as she has congestion in her chest. Please advise.

## 2022-06-07 ENCOUNTER — Encounter: Payer: Self-pay | Admitting: Internal Medicine

## 2022-06-07 ENCOUNTER — Ambulatory Visit (INDEPENDENT_AMBULATORY_CARE_PROVIDER_SITE_OTHER): Payer: 59 | Admitting: Internal Medicine

## 2022-06-07 VITALS — BP 128/74 | HR 60 | Temp 97.9°F | Ht 65.0 in | Wt 108.2 lb

## 2022-06-07 DIAGNOSIS — J449 Chronic obstructive pulmonary disease, unspecified: Secondary | ICD-10-CM | POA: Diagnosis not present

## 2022-06-07 DIAGNOSIS — R918 Other nonspecific abnormal finding of lung field: Secondary | ICD-10-CM | POA: Diagnosis not present

## 2022-06-07 NOTE — Patient Instructions (Signed)
Continue inhalers as prescribed Avoid secondhand smoke exposure Follow-up pain management clinic Obtain CT chest to assess for PULM nodules

## 2022-06-07 NOTE — Progress Notes (Signed)
[place] [facility] Consultation      Date: 06/07/2022,   MRN# [id number] Sheila Suarez 1955-12-01     Admission                  Current  Sheila Suarez is a 66 y.o. old female seen in consultation for SOB at the request of Dr. Genevive Bi   SYNOPSIS 41 AAF with chronic tobacco abuse Quit smoking 03/2016 Had dx of LUL nodule since 2012 Underwent LUL lung resection by Dr Genevive Bi 03/2016 as requested by oncology Benign findings per path reports  PFT ratio 78%-fef25/75 435predicted mild obstruction Moderate restriction-NL DLCO when corrected for volumes Her ONO and 6MWT WNL no evidence of hypoxia     CHIEF COMPLAINT:   Follow up MILD COPD Follow up pulmo nodules     HISTORY OF PRESENT ILLNESS   Chronic SOB/DOE  constant exposure to secondhand smoke exposure   No exacerbation at this time No evidence of heart failure at this time No evidence or signs of infection at this time No respiratory distress No fevers, chills, nausea, vomiting, diarrhea No evidence of lower extremity edema No evidence hemoptysis   COPD stable Continue anoro as prescribed   Current Medication:  Current Outpatient Medications:    chlorhexidine (PERIDEX) 0.12 % solution, Use as directed 10 mLs in the mouth or throat 2 (two) times daily. Swish and spit (Patient not taking: Reported on 11/22/2021), Disp: 120 mL, Rfl: 0   cyclobenzaprine (FLEXERIL) 5 MG tablet, Take 1-2 tablets 3 times daily as needed (Patient not taking: Reported on 11/09/2021), Disp: 20 tablet, Rfl: 0   gabapentin (NEURONTIN) 100 MG capsule, Take 1 capsule (100 mg total) by mouth at bedtime. May take a second capsule at bedside if necessary, Disp: 60 capsule, Rfl: 2   HYDROcodone-acetaminophen (NORCO/VICODIN) 5-325 MG tablet, Take 1 tablet by mouth every 6 (six) hours as needed for severe pain. (Patient not taking: Reported on 11/22/2021), Disp: 10 tablet, Rfl: 0   lactulose (CEPHULAC) 20 g packet, Take 1 packet (20 g  total) by mouth 3 (three) times daily. (Patient not taking: Reported on 11/09/2021), Disp: 30 each, Rfl: 0   levalbuterol (XOPENEX HFA) 45 MCG/ACT inhaler, Inhale 1 puff into the lungs every 4 (four) hours as needed for wheezing., Disp: 1 each, Rfl: 2   lidocaine (LIDODERM) 5 %, Place 1 patch onto the skin every 12 (twelve) hours. Remove & Discard patch within 12 hours or as directed by MD, Disp: 10 patch, Rfl: 0   linaclotide (LINZESS) 72 MCG capsule, Take 72 mcg by mouth daily before breakfast.  (Patient not taking: Reported on 11/09/2021), Disp: , Rfl:    megestrol (MEGACE) 400 MG/10ML suspension, Take 5 mLs (200 mg total) by mouth daily. (Patient not taking: Reported on 05/23/2021), Disp: 240 mL, Rfl: 0   predniSONE (DELTASONE) 20 MG tablet, Take 1 tablet (20 mg total) by mouth daily with breakfast., Disp: 7 tablet, Rfl: 0   sucralfate (CARAFATE) 1 g tablet, Take 1 tablet (1 g total) by mouth 4 (four) times daily., Disp: 244 tablet, Rfl: 0   umeclidinium-vilanterol (ANORO ELLIPTA) 62.5-25 MCG/ACT AEPB, Inhale 1 puff into the lungs daily., Disp: 1 each, Rfl: 10   umeclidinium-vilanterol (ANORO ELLIPTA) 62.5-25 MCG/INH AEPB, Inhale 1 puff into the lungs daily., Disp: 1 each, Rfl: 2    ALLERGIES   Patient has no known allergies.     Review of Systems: Gen:  Denies  fever, sweats, chills weight loss  HEENT: Denies  blurred vision, double vision, ear pain, eye pain, hearing loss, nose bleeds, sore throat Cardiac:  No dizziness, chest pain or heaviness, chest tightness,edema, No JVD Resp:   No cough, -sputum production, -shortness of breath,-wheezing, -hemoptysis,  Other:  All other systems negative    Physical Examination:   General Appearance: No distress  EYES PERRLA, EOM intact.   NECK Supple, No JVD Pulmonary: normal breath sounds, No wheezing.  CardiovascularNormal S1,S2.  No m/r/g.   Abdomen: Benign, Soft, non-tender. ALL OTHER ROS ARE NEGATIVE    IMAGING  CT chest  10/2016 I have Independently reviewed images of  CT chest    Interpretation:no acute findings, no effusions, no massess  PFT 08/29/16  Mod restrictive Lung disease FVC 63% predicted 2L FEF 25/75 53% predicted Mixed mild obstructive small airways disease   CT chest 11/2018  .kkindThe CT chest was Independently Reviewed  reviewed-5MM nodule unchanged in 2 years  CT chest 2021 small pulm nodules  CT CHEST 2021  ASSESSMENT/PLAN   66 year old [place] African-American female follow-up shortness of breath with mild COPD with chronic tobacco abuse with a history of left upper lobe nodule status postresection with benign pathology with intermittent reactive airways disease and small airways disease consistent with mild COPD based on pulmonary function test with 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   Shortness of breath and dyspnea exertion stable at the moment Previous 6-minute walk test pulse oximetry and nocturnal pulse oximetry were negative Has extensive second hand smoke exposure Patient advised to avoid secondhand smoke exposure  COPD Gold stage A Continue inhalers as prescribed Albuterol as needed Avoid allergens Avoid secondhand smoke No exacerbation at this time  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 Andree Elk  Pulmonary nodules Previous CT scan with CT of the chest was in 2021 Recommend repeating CT chest for interval changes  CURRENT MEDICATIONS REVIEWED AT LENGTH WITH PATIENT TODAY Continue inhalers as prescribed Avoid secondhand smoke exposure Follow-up pain management clinic Obtain CT chest to assess for PULM nodules  Patient  satisfied with Plan of action and management. All questions answered  Follow-up 1 year  Total time spent 35 minutes   Corrin Parker, M.D.  [facility]  Medical Director [place] Director [facility] Cardio-Pulmonary Department

## 2022-06-20 ENCOUNTER — Ambulatory Visit
Admission: RE | Admit: 2022-06-20 | Discharge: 2022-06-20 | Disposition: A | Discharge status: Home / Self Care | Payer: 59 | Source: Ambulatory Visit | Attending: Internal Medicine | Admitting: Internal Medicine

## 2022-06-20 ENCOUNTER — Ambulatory Visit: Payer: 59 | Admitting: Internal Medicine

## 2022-06-20 DIAGNOSIS — I2510 Atherosclerotic heart disease of native coronary artery without angina pectoris: Secondary | ICD-10-CM | POA: Insufficient documentation

## 2022-06-20 DIAGNOSIS — J439 Emphysema, unspecified: Secondary | ICD-10-CM | POA: Insufficient documentation

## 2022-06-20 DIAGNOSIS — J449 Chronic obstructive pulmonary disease, unspecified: Secondary | ICD-10-CM | POA: Insufficient documentation

## 2022-06-20 DIAGNOSIS — R918 Other nonspecific abnormal finding of lung field: Secondary | ICD-10-CM | POA: Insufficient documentation

## 2022-06-20 DIAGNOSIS — I700 Atherosclerosis of aorta: Secondary | ICD-10-CM | POA: Insufficient documentation

## 2023-06-15 ENCOUNTER — Telehealth: Payer: Self-pay

## 2023-06-15 ENCOUNTER — Ambulatory Visit (INDEPENDENT_AMBULATORY_CARE_PROVIDER_SITE_OTHER): Payer: 59 | Admitting: Internal Medicine

## 2023-06-15 ENCOUNTER — Encounter: Payer: Self-pay | Admitting: Internal Medicine

## 2023-06-15 VITALS — BP 130/78 | HR 65 | Temp 97.5°F | Ht 65.0 in | Wt 112.8 lb

## 2023-06-15 DIAGNOSIS — R918 Other nonspecific abnormal finding of lung field: Secondary | ICD-10-CM | POA: Diagnosis not present

## 2023-06-15 DIAGNOSIS — J449 Chronic obstructive pulmonary disease, unspecified: Secondary | ICD-10-CM

## 2023-06-15 MED ORDER — OHTUVAYRE 3 MG/2.5ML IN SUSP
2.5000 mL | Freq: Two times a day (BID) | RESPIRATORY_TRACT | Status: AC
Start: 2023-06-15 — End: ?

## 2023-06-15 NOTE — Patient Instructions (Addendum)
Plan to start [place] Nebulized for lung problems  Continue Anoro  Will obtain CT chest to assess for lung cancer  Referral to Cardiology for CAD   Avoid secondhand smoke Avoid SICK contacts Recommend  Masking  when appropriate Recommend Keep up-to-date with vaccinations

## 2023-06-15 NOTE — Telephone Encounter (Signed)
Patient was seen in the office today. Dr. Belia Heman has ordered [facility] for the patient.  She has signed the form and it has been faxed to our pharmacy team for completion.

## 2023-06-15 NOTE — Progress Notes (Signed)
[facility] Pulmonary Medicine Consultation      Date: 06/15/2023,   MRN# [id number] Sheila Suarez 02-Feb-1956      SYNOPSIS 67 AAF with chronic tobacco abuse Quit smoking 03/2016 Had dx of LUL nodule since 2012 Underwent LUL lung resection by Dr Thelma Barge 03/2016 as requested by oncology Benign findings per path reports  PFT ratio 78%-fef25/75 435predicted mild obstruction Moderate restriction-NL DLCO when corrected for volumes Her ONO and WNL no evidence of hypoxia     CHIEF COMPLAINT:   Follow up assessment of COPD Follow up assessment of pulmonary nodules Follow-up assessment for abnormal CT chest    HISTORY OF PRESENT ILLNESS   No exacerbation at this time No evidence of heart failure at this time No evidence or signs of infection at this time No respiratory distress No fevers, chills, nausea, vomiting, diarrhea No evidence of lower extremity edema No evidence hemoptysis  Chronic SOB/DOE Patient states her shortness of breath and dyspnea exertion is progressively worsening Patient takes Anoro however she needs additional therapy  constant exposure to secondhand smoke exposure   COPD progressive Continue anoro as prescribed I have discussed in detail with patient to prescribe ensifentrine which is a nebulized medication Which may help with her progressive shortness of breath and dyspnea on exertion  I have discussed her CT scan results at this visit from last year Will need further CT imaging to assess for lung nodules Patient with previous lung resection for lung nodules which were benign  Current Medication:  Current Outpatient Medications:    chlorhexidine (PERIDEX) 0.12 % solution, Use as directed 10 mLs in the mouth or throat 2 (two) times daily. Swish and spit (Patient not taking: Reported on 06/07/2022), Disp: 120 mL, Rfl: 0   cyclobenzaprine (FLEXERIL) 5 MG tablet, Take 1-2 tablets 3 times daily as needed (Patient not taking: Reported on  06/07/2022), Disp: 20 tablet, Rfl: 0   gabapentin (NEURONTIN) 100 MG capsule, Take 1 capsule (100 mg total) by mouth at bedtime. May take a second capsule at bedside if necessary, Disp: 60 capsule, Rfl: 2   HYDROcodone-acetaminophen (NORCO/VICODIN) 5-325 MG tablet, Take 1 tablet by mouth every 6 (six) hours as needed for severe pain. (Patient not taking: Reported on 06/07/2022), Disp: 10 tablet, Rfl: 0   lactulose (CEPHULAC) 20 g packet, Take 1 packet (20 g total) by mouth 3 (three) times daily. (Patient not taking: Reported on 06/07/2022), Disp: 30 each, Rfl: 0   levalbuterol (XOPENEX HFA) 45 MCG/ACT inhaler, Inhale 1 puff into the lungs every 4 (four) hours as needed for wheezing., Disp: 1 each, Rfl: 2   linaclotide (LINZESS) 72 MCG capsule, Take 72 mcg by mouth daily before breakfast.  (Patient not taking: Reported on 06/07/2022), Disp: , Rfl:    megestrol (MEGACE) 400 MG/10ML suspension, Take 5 mLs (200 mg total) by mouth daily. (Patient not taking: Reported on 06/07/2022), Disp: 240 mL, Rfl: 0   predniSONE (DELTASONE) 20 MG tablet, Take 1 tablet (20 mg total) by mouth daily with breakfast. (Patient not taking: Reported on 06/07/2022), Disp: 7 tablet, Rfl: 0   sucralfate (CARAFATE) 1 g tablet, Take 1 tablet (1 g total) by mouth 4 (four) times daily., Disp: 244 tablet, Rfl: 0   umeclidinium-vilanterol (ANORO ELLIPTA) 62.5-25 MCG/ACT AEPB, Inhale 1 puff into the lungs daily., Disp: 1 each, Rfl: 10    ALLERGIES   Patient has no known allergies.     BP 130/78 (BP Location: Right Arm, Cuff Size: Normal)   Pulse 65  Temp (!) 97.5 F (36.4 C)   Ht 5\' 5"  (1.651 m)   Wt 112 lb 12.8 oz (51.2 kg)   SpO2 95%   BMI 18.77 kg/m    Review of Systems: Gen:  Denies  fever, sweats, chills weight loss  HEENT: Denies blurred vision, double vision, ear pain, eye pain, hearing loss, nose bleeds, sore throat Cardiac:  No dizziness, chest pain or heaviness, chest tightness,edema, No JVD Resp:   No cough,  -sputum production, +shortness of breath,-wheezing, -hemoptysis,  Other:  All other systems negative   Physical Examination:   General Appearance: No distress  EYES PERRLA, EOM intact.   NECK Supple, No JVD Pulmonary: normal breath sounds, No wheezing.  CardiovascularNormal S1,S2.  No m/r/g.   Abdomen: Benign, Soft, non-tender. Neurology UE/LE 5/5 strength, no focal deficits Ext pulses intact, cap refill intact ALL OTHER ROS ARE NEGATIVE     IMAGING  CT chest 10/2016 I have Independently reviewed images of  CT chest    Interpretation:no acute findings, no effusions, no massess  PFT 08/29/16  Mod restrictive Lung disease FVC 63% predicted 2L FEF 25/75 53% predicted Mixed mild obstructive small airways disease   CT chest 11/2018  .[email or web address] CT chest was Independently Reviewed  reviewed-5MM nodule unchanged in 2 years  CT chest 2021 small pulm nodules  CT CHEST 2021  ASSESSMENT/PLAN   67 year old [place] African-American female seen today for follow-up assessment for shortness of breath and dyspnea on exertion with COPD with chronic tobacco abuse with a history of left upper lobe lung nodule status post resection with benign pathology with intermittent reactive airways disease and small airways disease consistent with COPD based on pulmonary function test also with 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  Pernell Dupre   Pulmonary nodules Previous CT scan with CT of the chest was in 2021 Recommend repeating CT chest for interval changes Previous history of wedge resection for benign nodule Plan to repeat CT chest at this time  Assessment of CAD Referral to cardiology  CURRENT MEDICATIONS REVIEWED AT LENGTH WITH PATIENT TODAY Continue inhalers as prescribed Plan to add new nebulizer therapy to her regimen Avoid secondhand smoke exposure Follow-up pain management clinic Obtain CT chest to assess for pulmonary nodules Referral to Cardiology  Patient  satisfied with Plan of action and management. All questions answered  Follow-up 2 months  Total time spent 45 minutes   Lucie Leather, M.D.  [facility]  Medical Director [facility] Medical Director [facility] Cardio-Pulmonary Department

## 2023-06-18 NOTE — Telephone Encounter (Signed)
Received. Faxed to [employer] with insurance card copy and clinicals  Fax: [phone] Phone: [phone]  Chesley Mires, PharmD, MPH, BCPS, CPP Clinical Pharmacist (Rheumatology and Pulmonology)

## 2023-06-19 NOTE — Telephone Encounter (Signed)
Received fax from [place] [employer] that patient has been enrolled in pathway plus  Patient ID: [id number]  Rx for Sharyn Blitz will be triaged to [facility]   Chesley Mires, PharmD, MPH, BCPS, CPP Clinical Pharmacist (Rheumatology and Pulmonology)

## 2023-06-22 NOTE — Telephone Encounter (Signed)
Spoke to patient. She is wanting to know if she should reach out about Ohtuvayre or if the company will reach out to her. She is aware that [place] [employer] will contact her.  She voiced her understanding.

## 2023-07-03 ENCOUNTER — Ambulatory Visit
Admission: RE | Admit: 2023-07-03 | Discharge: 2023-07-03 | Disposition: A | Discharge status: Home / Self Care | Payer: 59 | Source: Ambulatory Visit | Attending: Internal Medicine | Admitting: Internal Medicine

## 2023-07-03 DIAGNOSIS — R918 Other nonspecific abnormal finding of lung field: Secondary | ICD-10-CM | POA: Diagnosis present
